# Patient Record
Sex: Male | Born: 1955 | Race: White | Hispanic: No | Smoking: Current some day smoker
Health system: Southern US, Community
[De-identification: ages and names within clinical notes are randomized; demographics above are authoritative.]

## PROBLEM LIST (undated history)

## (undated) DIAGNOSIS — E11628 Type 2 diabetes mellitus with other skin complications: Secondary | ICD-10-CM

## (undated) DIAGNOSIS — E1149 Type 2 diabetes mellitus with other diabetic neurological complication: Secondary | ICD-10-CM

## (undated) DIAGNOSIS — I10 Essential (primary) hypertension: Secondary | ICD-10-CM

## (undated) DIAGNOSIS — L089 Local infection of the skin and subcutaneous tissue, unspecified: Secondary | ICD-10-CM

## (undated) DIAGNOSIS — A4102 Sepsis due to Methicillin resistant Staphylococcus aureus: Secondary | ICD-10-CM

## (undated) DIAGNOSIS — E785 Hyperlipidemia, unspecified: Secondary | ICD-10-CM

## (undated) DIAGNOSIS — N412 Abscess of prostate: Secondary | ICD-10-CM

## (undated) HISTORY — PX: CARDIAC CATHETERIZATION: SHX172

## (undated) HISTORY — DX: Abscess of prostate: N41.2

---

## 1998-06-15 ENCOUNTER — Encounter: Admission: RE | Admit: 1998-06-15 | Discharge: 1998-09-13 | Payer: Self-pay | Admitting: Internal Medicine

## 1998-09-14 ENCOUNTER — Encounter: Admission: RE | Admit: 1998-09-14 | Discharge: 1998-12-08 | Payer: Self-pay | Admitting: Internal Medicine

## 1999-01-04 ENCOUNTER — Encounter: Admission: RE | Admit: 1999-01-04 | Discharge: 1999-04-04 | Payer: Self-pay | Admitting: Internal Medicine

## 1999-04-04 ENCOUNTER — Encounter: Admission: RE | Admit: 1999-04-04 | Discharge: 1999-07-03 | Payer: Self-pay | Admitting: Internal Medicine

## 1999-07-07 ENCOUNTER — Encounter: Admission: RE | Admit: 1999-07-07 | Discharge: 1999-10-05 | Payer: Self-pay | Admitting: Surgery

## 1999-09-13 ENCOUNTER — Encounter (HOSPITAL_BASED_OUTPATIENT_CLINIC_OR_DEPARTMENT_OTHER): Payer: Self-pay | Admitting: Internal Medicine

## 1999-09-20 ENCOUNTER — Ambulatory Visit (HOSPITAL_COMMUNITY): Admission: RE | Admit: 1999-09-20 | Discharge: 1999-09-20 | Payer: Self-pay

## 1999-09-20 ENCOUNTER — Encounter (INDEPENDENT_AMBULATORY_CARE_PROVIDER_SITE_OTHER): Payer: Self-pay | Admitting: *Deleted

## 1999-10-24 ENCOUNTER — Encounter: Admission: RE | Admit: 1999-10-24 | Discharge: 2000-01-22 | Payer: Self-pay | Admitting: Internal Medicine

## 2000-02-26 ENCOUNTER — Encounter: Admission: RE | Admit: 2000-02-26 | Discharge: 2000-05-26 | Payer: Self-pay | Admitting: Internal Medicine

## 2000-06-04 ENCOUNTER — Encounter: Admission: RE | Admit: 2000-06-04 | Discharge: 2000-09-02 | Payer: Self-pay | Admitting: *Deleted

## 2000-07-21 ENCOUNTER — Encounter: Payer: Self-pay | Admitting: *Deleted

## 2000-07-21 ENCOUNTER — Emergency Department (HOSPITAL_COMMUNITY): Admission: EM | Admit: 2000-07-21 | Discharge: 2000-07-21 | Payer: Self-pay

## 2000-07-22 ENCOUNTER — Emergency Department (HOSPITAL_COMMUNITY): Admission: EM | Admit: 2000-07-22 | Discharge: 2000-07-22 | Payer: Self-pay | Admitting: Emergency Medicine

## 2000-10-02 ENCOUNTER — Encounter: Admission: RE | Admit: 2000-10-02 | Discharge: 2000-12-31 | Payer: Self-pay | Admitting: Internal Medicine

## 2001-01-01 ENCOUNTER — Encounter: Admission: RE | Admit: 2001-01-01 | Discharge: 2001-01-06 | Payer: Self-pay | Admitting: Internal Medicine

## 2004-11-26 DIAGNOSIS — L97909 Non-pressure chronic ulcer of unspecified part of unspecified lower leg with unspecified severity: Secondary | ICD-10-CM | POA: Insufficient documentation

## 2004-12-10 ENCOUNTER — Ambulatory Visit: Payer: Self-pay | Admitting: Infectious Diseases

## 2004-12-10 ENCOUNTER — Inpatient Hospital Stay (HOSPITAL_COMMUNITY): Admission: EM | Admit: 2004-12-10 | Discharge: 2004-12-20 | Payer: Self-pay | Admitting: Family Medicine

## 2007-02-13 ENCOUNTER — Ambulatory Visit: Payer: Self-pay | Admitting: Internal Medicine

## 2007-02-13 LAB — CONVERTED CEMR LAB
LDL Cholesterol: 63 mg/dL
Triglycerides: 176 mg/dL

## 2007-02-25 ENCOUNTER — Ambulatory Visit: Payer: Self-pay | Admitting: Internal Medicine

## 2007-03-04 ENCOUNTER — Ambulatory Visit: Payer: Self-pay | Admitting: *Deleted

## 2007-03-25 ENCOUNTER — Ambulatory Visit: Payer: Self-pay | Admitting: Internal Medicine

## 2007-03-26 ENCOUNTER — Ambulatory Visit: Payer: Self-pay | Admitting: Internal Medicine

## 2007-05-01 ENCOUNTER — Ambulatory Visit: Payer: Self-pay | Admitting: Internal Medicine

## 2007-08-20 ENCOUNTER — Ambulatory Visit: Payer: Self-pay | Admitting: Internal Medicine

## 2007-08-20 ENCOUNTER — Encounter (INDEPENDENT_AMBULATORY_CARE_PROVIDER_SITE_OTHER): Payer: Self-pay | Admitting: Internal Medicine

## 2007-08-26 DIAGNOSIS — E785 Hyperlipidemia, unspecified: Secondary | ICD-10-CM

## 2007-08-26 DIAGNOSIS — I10 Essential (primary) hypertension: Secondary | ICD-10-CM

## 2007-08-26 HISTORY — DX: Hyperlipidemia, unspecified: E78.5

## 2007-10-02 ENCOUNTER — Ambulatory Visit: Payer: Self-pay | Admitting: Internal Medicine

## 2007-10-02 DIAGNOSIS — E1140 Type 2 diabetes mellitus with diabetic neuropathy, unspecified: Secondary | ICD-10-CM | POA: Insufficient documentation

## 2007-10-02 DIAGNOSIS — R109 Unspecified abdominal pain: Secondary | ICD-10-CM | POA: Insufficient documentation

## 2007-10-02 DIAGNOSIS — E1149 Type 2 diabetes mellitus with other diabetic neurological complication: Secondary | ICD-10-CM

## 2007-10-02 DIAGNOSIS — E119 Type 2 diabetes mellitus without complications: Secondary | ICD-10-CM | POA: Insufficient documentation

## 2007-10-02 DIAGNOSIS — R350 Frequency of micturition: Secondary | ICD-10-CM | POA: Insufficient documentation

## 2007-10-02 DIAGNOSIS — N4611 Organic oligospermia: Secondary | ICD-10-CM | POA: Insufficient documentation

## 2007-10-02 DIAGNOSIS — R609 Edema, unspecified: Secondary | ICD-10-CM | POA: Insufficient documentation

## 2007-10-02 HISTORY — DX: Type 2 diabetes mellitus with other diabetic neurological complication: E11.49

## 2007-10-02 LAB — CONVERTED CEMR LAB
Blood Glucose, AC Bkfst: 94 mg/dL
Blood Glucose, Fingerstick: 288
Nitrite: NEGATIVE
PSA: 0.75 ng/mL (ref 0.10–4.00)
Specific Gravity, Urine: 1.015
WBC Urine, dipstick: NEGATIVE
pH: 6.5

## 2007-10-12 ENCOUNTER — Encounter (INDEPENDENT_AMBULATORY_CARE_PROVIDER_SITE_OTHER): Payer: Self-pay | Admitting: Internal Medicine

## 2007-11-04 ENCOUNTER — Ambulatory Visit: Payer: Self-pay | Admitting: Internal Medicine

## 2007-11-04 DIAGNOSIS — B9789 Other viral agents as the cause of diseases classified elsewhere: Secondary | ICD-10-CM | POA: Insufficient documentation

## 2007-11-04 LAB — CONVERTED CEMR LAB: Blood Glucose, Fingerstick: 166

## 2007-11-05 ENCOUNTER — Encounter (INDEPENDENT_AMBULATORY_CARE_PROVIDER_SITE_OTHER): Payer: Self-pay | Admitting: Internal Medicine

## 2007-11-09 LAB — CONVERTED CEMR LAB
BUN: 6 mg/dL (ref 6–23)
Chloride: 97 meq/L (ref 96–112)
Creatinine, Ser: 0.8 mg/dL (ref 0.40–1.50)
Potassium: 3 meq/L — ABNORMAL LOW (ref 3.5–5.3)

## 2007-11-25 ENCOUNTER — Encounter (INDEPENDENT_AMBULATORY_CARE_PROVIDER_SITE_OTHER): Payer: Self-pay | Admitting: Nurse Practitioner

## 2007-11-25 ENCOUNTER — Ambulatory Visit: Payer: Self-pay | Admitting: Internal Medicine

## 2007-11-26 LAB — CONVERTED CEMR LAB
Calcium: 8.8 mg/dL (ref 8.4–10.5)
Creatinine, Ser: 0.69 mg/dL (ref 0.40–1.50)
Potassium: 3.1 meq/L — ABNORMAL LOW (ref 3.5–5.3)
Sodium: 139 meq/L (ref 135–145)

## 2007-12-05 ENCOUNTER — Ambulatory Visit: Payer: Self-pay | Admitting: Internal Medicine

## 2008-01-19 ENCOUNTER — Telehealth (INDEPENDENT_AMBULATORY_CARE_PROVIDER_SITE_OTHER): Payer: Self-pay | Admitting: Internal Medicine

## 2008-01-20 ENCOUNTER — Ambulatory Visit (HOSPITAL_COMMUNITY): Admission: RE | Admit: 2008-01-20 | Discharge: 2008-01-20 | Payer: Self-pay | Admitting: Internal Medicine

## 2008-01-20 ENCOUNTER — Ambulatory Visit: Payer: Self-pay | Admitting: Internal Medicine

## 2008-01-20 LAB — CONVERTED CEMR LAB: Blood Glucose, Fingerstick: 438

## 2008-01-23 ENCOUNTER — Ambulatory Visit: Payer: Self-pay | Admitting: Internal Medicine

## 2008-01-23 DIAGNOSIS — J209 Acute bronchitis, unspecified: Secondary | ICD-10-CM | POA: Insufficient documentation

## 2008-01-23 LAB — CONVERTED CEMR LAB: Blood Glucose, Fingerstick: 257

## 2008-04-02 ENCOUNTER — Encounter (INDEPENDENT_AMBULATORY_CARE_PROVIDER_SITE_OTHER): Payer: Self-pay | Admitting: Internal Medicine

## 2008-05-11 ENCOUNTER — Ambulatory Visit: Payer: Self-pay | Admitting: Internal Medicine

## 2008-05-11 DIAGNOSIS — R634 Abnormal weight loss: Secondary | ICD-10-CM | POA: Insufficient documentation

## 2008-05-11 DIAGNOSIS — F329 Major depressive disorder, single episode, unspecified: Secondary | ICD-10-CM

## 2008-05-17 LAB — CONVERTED CEMR LAB
ALT: 26 units/L (ref 0–53)
AST: 40 units/L — ABNORMAL HIGH (ref 0–37)
Alkaline Phosphatase: 67 units/L (ref 39–117)
Basophils Absolute: 0 10*3/uL (ref 0.0–0.1)
CO2: 33 meq/L — ABNORMAL HIGH (ref 19–32)
Creatinine, Ser: 0.87 mg/dL (ref 0.40–1.50)
Eosinophils Absolute: 0.1 10*3/uL (ref 0.0–0.7)
Eosinophils Relative: 1 % (ref 0–5)
Glucose, Bld: 165 mg/dL — ABNORMAL HIGH (ref 70–99)
Lymphocytes Relative: 26 % (ref 12–46)
MCHC: 32.4 g/dL (ref 30.0–36.0)
Potassium: 3.2 meq/L — ABNORMAL LOW (ref 3.5–5.3)
Total Bilirubin: 1 mg/dL (ref 0.3–1.2)
Total Protein: 7.8 g/dL (ref 6.0–8.3)
WBC: 7.2 10*3/uL (ref 4.0–10.5)

## 2008-05-18 ENCOUNTER — Ambulatory Visit: Payer: Self-pay | Admitting: Internal Medicine

## 2008-06-10 LAB — CONVERTED CEMR LAB
Iron: 90 ug/dL (ref 42–165)
TIBC: 308 ug/dL (ref 215–435)
UIBC: 218 ug/dL

## 2008-07-02 ENCOUNTER — Ambulatory Visit: Payer: Self-pay | Admitting: Internal Medicine

## 2008-07-02 DIAGNOSIS — E876 Hypokalemia: Secondary | ICD-10-CM | POA: Insufficient documentation

## 2008-07-02 DIAGNOSIS — D649 Anemia, unspecified: Secondary | ICD-10-CM

## 2008-07-02 LAB — CONVERTED CEMR LAB
Bilirubin Urine: NEGATIVE
Blood Glucose, Fingerstick: 141
Glucose, Urine, Semiquant: NEGATIVE
Ketones, urine, test strip: NEGATIVE
WBC Urine, dipstick: NEGATIVE
pH: 7

## 2008-07-03 LAB — CONVERTED CEMR LAB: Vitamin B-12: 269 pg/mL (ref 211–911)

## 2008-07-06 ENCOUNTER — Encounter (INDEPENDENT_AMBULATORY_CARE_PROVIDER_SITE_OTHER): Payer: Self-pay | Admitting: Internal Medicine

## 2008-07-07 ENCOUNTER — Telehealth (INDEPENDENT_AMBULATORY_CARE_PROVIDER_SITE_OTHER): Payer: Self-pay | Admitting: Internal Medicine

## 2008-07-07 DIAGNOSIS — K029 Dental caries, unspecified: Secondary | ICD-10-CM | POA: Insufficient documentation

## 2008-07-08 ENCOUNTER — Encounter (INDEPENDENT_AMBULATORY_CARE_PROVIDER_SITE_OTHER): Payer: Self-pay | Admitting: Internal Medicine

## 2008-07-09 ENCOUNTER — Ambulatory Visit: Payer: Self-pay | Admitting: Internal Medicine

## 2008-07-17 DIAGNOSIS — D518 Other vitamin B12 deficiency anemias: Secondary | ICD-10-CM

## 2008-07-23 ENCOUNTER — Ambulatory Visit: Payer: Self-pay | Admitting: Internal Medicine

## 2008-07-26 ENCOUNTER — Ambulatory Visit: Payer: Self-pay | Admitting: Family Medicine

## 2008-07-27 ENCOUNTER — Ambulatory Visit: Payer: Self-pay | Admitting: Internal Medicine

## 2008-07-28 ENCOUNTER — Ambulatory Visit: Payer: Self-pay | Admitting: Internal Medicine

## 2008-07-29 ENCOUNTER — Ambulatory Visit: Payer: Self-pay | Admitting: Internal Medicine

## 2008-07-30 ENCOUNTER — Ambulatory Visit: Payer: Self-pay | Admitting: Internal Medicine

## 2008-08-03 ENCOUNTER — Ambulatory Visit: Payer: Self-pay | Admitting: Internal Medicine

## 2008-08-20 ENCOUNTER — Ambulatory Visit: Payer: Self-pay | Admitting: Internal Medicine

## 2008-10-07 ENCOUNTER — Ambulatory Visit: Payer: Self-pay | Admitting: Internal Medicine

## 2008-10-07 LAB — CONVERTED CEMR LAB: Blood Glucose, Fingerstick: 374

## 2008-10-19 ENCOUNTER — Ambulatory Visit: Payer: Self-pay | Admitting: Internal Medicine

## 2008-10-26 ENCOUNTER — Encounter (INDEPENDENT_AMBULATORY_CARE_PROVIDER_SITE_OTHER): Payer: Self-pay | Admitting: Internal Medicine

## 2008-11-12 ENCOUNTER — Encounter (INDEPENDENT_AMBULATORY_CARE_PROVIDER_SITE_OTHER): Payer: Self-pay | Admitting: Internal Medicine

## 2008-11-12 DIAGNOSIS — E11311 Type 2 diabetes mellitus with unspecified diabetic retinopathy with macular edema: Secondary | ICD-10-CM | POA: Insufficient documentation

## 2008-11-24 ENCOUNTER — Telehealth (INDEPENDENT_AMBULATORY_CARE_PROVIDER_SITE_OTHER): Payer: Self-pay | Admitting: Internal Medicine

## 2009-01-07 ENCOUNTER — Telehealth (INDEPENDENT_AMBULATORY_CARE_PROVIDER_SITE_OTHER): Payer: Self-pay | Admitting: *Deleted

## 2009-01-20 ENCOUNTER — Ambulatory Visit: Payer: Self-pay | Admitting: Internal Medicine

## 2009-01-20 DIAGNOSIS — N390 Urinary tract infection, site not specified: Secondary | ICD-10-CM

## 2009-01-20 LAB — CONVERTED CEMR LAB
Bilirubin Urine: NEGATIVE
Hgb A1c MFr Bld: 11.1 %
Nitrite: NEGATIVE
pH: 5

## 2009-01-21 ENCOUNTER — Encounter (INDEPENDENT_AMBULATORY_CARE_PROVIDER_SITE_OTHER): Payer: Self-pay | Admitting: Internal Medicine

## 2009-01-21 LAB — CONVERTED CEMR LAB
ALT: 82 units/L — ABNORMAL HIGH (ref 0–53)
Alkaline Phosphatase: 129 units/L — ABNORMAL HIGH (ref 39–117)
Basophils Absolute: 0 10*3/uL (ref 0.0–0.1)
Calcium: 8.9 mg/dL (ref 8.4–10.5)
Chloride: 99 meq/L (ref 96–112)
Creatinine, Ser: 0.77 mg/dL (ref 0.40–1.50)
Glucose, Bld: 397 mg/dL — ABNORMAL HIGH (ref 70–99)
HCT: 30.7 % — ABNORMAL LOW (ref 39.0–52.0)
Iron: 20 ug/dL — ABNORMAL LOW (ref 42–165)
Lymphocytes Relative: 13 % (ref 12–46)
MCHC: 31.9 g/dL (ref 30.0–36.0)
RDW: 13.3 % (ref 11.5–15.5)
Saturation Ratios: 8 % — ABNORMAL LOW (ref 20–55)
TIBC: 259 ug/dL (ref 215–435)
Vitamin B-12: 1244 pg/mL — ABNORMAL HIGH (ref 211–911)

## 2009-01-24 ENCOUNTER — Telehealth (INDEPENDENT_AMBULATORY_CARE_PROVIDER_SITE_OTHER): Payer: Self-pay | Admitting: Internal Medicine

## 2009-01-28 ENCOUNTER — Ambulatory Visit: Payer: Self-pay | Admitting: Internal Medicine

## 2009-01-28 DIAGNOSIS — E860 Dehydration: Secondary | ICD-10-CM

## 2009-01-28 LAB — CONVERTED CEMR LAB
Nitrite: NEGATIVE
Protein, U semiquant: 100
Urobilinogen, UA: 2

## 2009-01-29 ENCOUNTER — Encounter (INDEPENDENT_AMBULATORY_CARE_PROVIDER_SITE_OTHER): Payer: Self-pay | Admitting: Internal Medicine

## 2009-02-01 ENCOUNTER — Ambulatory Visit: Payer: Self-pay | Admitting: Internal Medicine

## 2009-02-01 LAB — CONVERTED CEMR LAB
Band Neutrophils: 0 % (ref 0–10)
Basophils Absolute: 0 10*3/uL (ref 0.0–0.1)
Basophils Relative: 0 % (ref 0–1)
Blood Glucose, Fingerstick: 274
Creatinine, Ser: 1.04 mg/dL (ref 0.40–1.50)
HCT: 33.6 % — ABNORMAL LOW (ref 39.0–52.0)
Lymphs Abs: 1.5 10*3/uL (ref 0.7–4.0)
MCHC: 32.4 g/dL (ref 30.0–36.0)
MCV: 88.7 fL (ref 78.0–100.0)
Monocytes Absolute: 0.7 10*3/uL (ref 0.1–1.0)
Neutro Abs: 8.7 10*3/uL — ABNORMAL HIGH (ref 1.7–7.7)
Platelets: 355 10*3/uL (ref 150–400)
Potassium: 5.3 meq/L (ref 3.5–5.3)
RDW: 14.1 % (ref 11.5–15.5)
Sodium: 134 meq/L — ABNORMAL LOW (ref 135–145)
Total Bilirubin: 0.7 mg/dL (ref 0.3–1.2)
Total Protein: 7.2 g/dL (ref 6.0–8.3)

## 2009-02-03 ENCOUNTER — Ambulatory Visit: Payer: Self-pay | Admitting: Internal Medicine

## 2009-02-03 LAB — CONVERTED CEMR LAB
Bilirubin Urine: NEGATIVE
Blood in Urine, dipstick: NEGATIVE
Glucose, Urine, Semiquant: NEGATIVE
Ketones, urine, test strip: NEGATIVE
pH: 8

## 2009-02-04 ENCOUNTER — Encounter (INDEPENDENT_AMBULATORY_CARE_PROVIDER_SITE_OTHER): Payer: Self-pay | Admitting: Internal Medicine

## 2009-02-07 ENCOUNTER — Inpatient Hospital Stay (HOSPITAL_COMMUNITY): Admission: EM | Admit: 2009-02-07 | Discharge: 2009-02-15 | Payer: Self-pay | Admitting: Emergency Medicine

## 2009-02-14 ENCOUNTER — Encounter (INDEPENDENT_AMBULATORY_CARE_PROVIDER_SITE_OTHER): Payer: Self-pay | Admitting: Internal Medicine

## 2009-02-16 ENCOUNTER — Encounter (INDEPENDENT_AMBULATORY_CARE_PROVIDER_SITE_OTHER): Payer: Self-pay | Admitting: Internal Medicine

## 2009-02-17 ENCOUNTER — Ambulatory Visit: Payer: Self-pay | Admitting: Internal Medicine

## 2009-02-17 DIAGNOSIS — N412 Abscess of prostate: Secondary | ICD-10-CM | POA: Insufficient documentation

## 2009-02-17 HISTORY — DX: Abscess of prostate: N41.2

## 2009-03-01 ENCOUNTER — Ambulatory Visit: Payer: Self-pay | Admitting: Internal Medicine

## 2009-03-01 LAB — CONVERTED CEMR LAB
Chloride: 101 meq/L (ref 96–112)
Creatinine, Ser: 1.7 mg/dL — ABNORMAL HIGH (ref 0.40–1.50)
Glucose, Bld: 234 mg/dL — ABNORMAL HIGH (ref 70–99)
Sodium: 136 meq/L (ref 135–145)

## 2009-03-15 ENCOUNTER — Ambulatory Visit: Payer: Self-pay | Admitting: Internal Medicine

## 2009-03-15 ENCOUNTER — Telehealth (INDEPENDENT_AMBULATORY_CARE_PROVIDER_SITE_OTHER): Payer: Self-pay | Admitting: Internal Medicine

## 2009-03-16 LAB — CONVERTED CEMR LAB
BUN: 33 mg/dL — ABNORMAL HIGH (ref 6–23)
CO2: 22 meq/L (ref 19–32)
Calcium: 9.9 mg/dL (ref 8.4–10.5)
Chloride: 98 meq/L (ref 96–112)
Creatinine, Ser: 1.83 mg/dL — ABNORMAL HIGH (ref 0.40–1.50)
Potassium: 6.1 meq/L — ABNORMAL HIGH (ref 3.5–5.3)
Sodium: 132 meq/L — ABNORMAL LOW (ref 135–145)

## 2009-03-17 ENCOUNTER — Encounter: Admission: RE | Admit: 2009-03-17 | Discharge: 2009-03-17 | Payer: Self-pay | Admitting: Internal Medicine

## 2009-03-17 ENCOUNTER — Encounter (INDEPENDENT_AMBULATORY_CARE_PROVIDER_SITE_OTHER): Payer: Self-pay | Admitting: Internal Medicine

## 2009-03-22 ENCOUNTER — Ambulatory Visit: Payer: Self-pay | Admitting: Internal Medicine

## 2009-03-22 DIAGNOSIS — E875 Hyperkalemia: Secondary | ICD-10-CM | POA: Insufficient documentation

## 2009-03-23 ENCOUNTER — Encounter (INDEPENDENT_AMBULATORY_CARE_PROVIDER_SITE_OTHER): Payer: Self-pay | Admitting: Internal Medicine

## 2009-03-24 ENCOUNTER — Ambulatory Visit (HOSPITAL_COMMUNITY): Admission: RE | Admit: 2009-03-24 | Discharge: 2009-03-24 | Payer: Self-pay | Admitting: Internal Medicine

## 2009-03-25 ENCOUNTER — Encounter (INDEPENDENT_AMBULATORY_CARE_PROVIDER_SITE_OTHER): Payer: Self-pay | Admitting: Internal Medicine

## 2009-04-08 ENCOUNTER — Ambulatory Visit: Payer: Self-pay | Admitting: Internal Medicine

## 2009-04-08 LAB — CONVERTED CEMR LAB
Blood Glucose, Fingerstick: 172
Blood in Urine, dipstick: NEGATIVE
Nitrite: NEGATIVE
Protein, U semiquant: 30
Urobilinogen, UA: 1
WBC Urine, dipstick: NEGATIVE

## 2009-04-14 ENCOUNTER — Encounter (INDEPENDENT_AMBULATORY_CARE_PROVIDER_SITE_OTHER): Payer: Self-pay | Admitting: Internal Medicine

## 2009-04-15 ENCOUNTER — Ambulatory Visit: Payer: Self-pay | Admitting: Internal Medicine

## 2009-04-17 ENCOUNTER — Encounter (INDEPENDENT_AMBULATORY_CARE_PROVIDER_SITE_OTHER): Payer: Self-pay | Admitting: Internal Medicine

## 2009-04-22 LAB — CONVERTED CEMR LAB
ALT: 14 units/L (ref 0–53)
Albumin: 4 g/dL (ref 3.5–5.2)
Alkaline Phosphatase: 63 units/L (ref 39–117)
Basophils Absolute: 0 10*3/uL (ref 0.0–0.1)
Calcium: 9.2 mg/dL (ref 8.4–10.5)
Chloride: 101 meq/L (ref 96–112)
Cholesterol: 148 mg/dL (ref 0–200)
Creatinine, Ser: 1.49 mg/dL (ref 0.40–1.50)
Eosinophils Absolute: 0.2 10*3/uL (ref 0.0–0.7)
Eosinophils Relative: 4 % (ref 0–5)
MCHC: 32.3 g/dL (ref 30.0–36.0)
MCV: 86.7 fL (ref 78.0–100.0)
PSA: 0.73 ng/mL (ref 0.10–4.00)
RDW: 16.9 % — ABNORMAL HIGH (ref 11.5–15.5)
Sodium: 139 meq/L (ref 135–145)
Total Bilirubin: 0.5 mg/dL (ref 0.3–1.2)
Total Protein: 7.8 g/dL (ref 6.0–8.3)
VLDL: 33 mg/dL (ref 0–40)
WBC: 5.3 10*3/uL (ref 4.0–10.5)

## 2009-04-26 ENCOUNTER — Telehealth (INDEPENDENT_AMBULATORY_CARE_PROVIDER_SITE_OTHER): Payer: Self-pay | Admitting: Internal Medicine

## 2009-05-17 ENCOUNTER — Ambulatory Visit: Payer: Self-pay | Admitting: Internal Medicine

## 2009-05-17 DIAGNOSIS — I959 Hypotension, unspecified: Secondary | ICD-10-CM | POA: Insufficient documentation

## 2009-05-19 DIAGNOSIS — N179 Acute kidney failure, unspecified: Secondary | ICD-10-CM

## 2009-05-19 LAB — CONVERTED CEMR LAB
AST: 35 units/L (ref 0–37)
BUN: 60 mg/dL — ABNORMAL HIGH (ref 6–23)
Chloride: 103 meq/L (ref 96–112)
Creatinine, Ser: 3.38 mg/dL — ABNORMAL HIGH (ref 0.40–1.50)
HCT: 26.2 % — ABNORMAL LOW (ref 39.0–52.0)
Hemoglobin: 8.4 g/dL — ABNORMAL LOW (ref 13.0–17.0)
Lymphocytes Relative: 26 % (ref 12–46)
Lymphs Abs: 1.5 10*3/uL (ref 0.7–4.0)
MCHC: 32.1 g/dL (ref 30.0–36.0)
Neutro Abs: 3.8 10*3/uL (ref 1.7–7.7)
Platelets: 122 10*3/uL — ABNORMAL LOW (ref 150–400)
Total Bilirubin: 0.5 mg/dL (ref 0.3–1.2)
WBC: 5.8 10*3/uL (ref 4.0–10.5)

## 2009-05-20 ENCOUNTER — Encounter: Payer: Self-pay | Admitting: Cardiovascular Disease

## 2009-05-20 ENCOUNTER — Ambulatory Visit: Payer: Self-pay | Admitting: Internal Medicine

## 2009-05-20 LAB — CONVERTED CEMR LAB
Bilirubin Urine: NEGATIVE
Calcium: 9 mg/dL (ref 8.4–10.5)
Glucose, Bld: 229 mg/dL — ABNORMAL HIGH (ref 70–99)
Glucose, Urine, Semiquant: >=1000
Potassium: 5.3 meq/L (ref 3.5–5.3)
Pro B Natriuretic peptide (BNP): 54.2 pg/mL (ref 0.0–100.0)
Relative Index: 3.4 — ABNORMAL HIGH (ref 0.0–2.5)
Sodium: 136 meq/L (ref 135–145)
Total CK: 806 units/L — ABNORMAL HIGH (ref 7–232)
Troponin I: 0.03 ng/mL (ref <0.06)
WBC Urine, dipstick: NEGATIVE

## 2009-05-23 ENCOUNTER — Ambulatory Visit (HOSPITAL_COMMUNITY): Admission: RE | Admit: 2009-05-23 | Discharge: 2009-05-23 | Payer: Self-pay | Admitting: Internal Medicine

## 2009-05-24 ENCOUNTER — Ambulatory Visit: Payer: Self-pay | Admitting: Internal Medicine

## 2009-05-24 ENCOUNTER — Encounter: Payer: Self-pay | Admitting: Cardiovascular Disease

## 2009-05-25 LAB — CONVERTED CEMR LAB
Calcium: 8.3 mg/dL — ABNORMAL LOW (ref 8.4–10.5)
Potassium: 4.7 meq/L (ref 3.5–5.3)
Sodium: 139 meq/L (ref 135–145)
Total CK: 459 units/L — ABNORMAL HIGH (ref 7–232)
Troponin I: 0.02 ng/mL (ref <0.06)

## 2009-06-23 ENCOUNTER — Ambulatory Visit: Payer: Self-pay | Admitting: Internal Medicine

## 2009-06-27 LAB — CONVERTED CEMR LAB
CO2: 20 meq/L (ref 19–32)
Chloride: 100 meq/L (ref 96–112)
Creatinine, Ser: 1.16 mg/dL (ref 0.40–1.50)
Potassium: 5.9 meq/L — ABNORMAL HIGH (ref 3.5–5.3)
Sodium: 134 meq/L — ABNORMAL LOW (ref 135–145)

## 2009-07-01 ENCOUNTER — Ambulatory Visit: Payer: Self-pay | Admitting: Internal Medicine

## 2009-07-04 ENCOUNTER — Telehealth (INDEPENDENT_AMBULATORY_CARE_PROVIDER_SITE_OTHER): Payer: Self-pay | Admitting: Internal Medicine

## 2009-07-19 ENCOUNTER — Ambulatory Visit: Payer: Self-pay | Admitting: Cardiovascular Disease

## 2009-07-19 DIAGNOSIS — I739 Peripheral vascular disease, unspecified: Secondary | ICD-10-CM

## 2009-07-19 DIAGNOSIS — R0602 Shortness of breath: Secondary | ICD-10-CM | POA: Insufficient documentation

## 2009-07-21 ENCOUNTER — Ambulatory Visit: Payer: Self-pay | Admitting: Internal Medicine

## 2009-07-21 LAB — CONVERTED CEMR LAB
ALT: 17 units/L (ref 0–53)
Albumin: 4.3 g/dL (ref 3.5–5.2)
Cholesterol: 138 mg/dL (ref 0–200)
Collection Interval-CRCL: 24 hr
Creatinine, Urine: 46.2 mg/dL
HDL: 32 mg/dL — ABNORMAL LOW (ref >39)
Indirect Bilirubin: 0.6 mg/dL (ref 0.0–0.9)
LDL Cholesterol: 72 mg/dL (ref 0–99)
Protein, Ur: 108 mg/24hr — ABNORMAL HIGH (ref 50–100)
Total Protein: 7.6 g/dL (ref 6.0–8.3)
Triglycerides: 169 mg/dL — ABNORMAL HIGH (ref <150)
VLDL: 34 mg/dL (ref 0–40)

## 2009-07-27 ENCOUNTER — Telehealth (INDEPENDENT_AMBULATORY_CARE_PROVIDER_SITE_OTHER): Payer: Self-pay

## 2009-07-28 ENCOUNTER — Encounter: Payer: Self-pay | Admitting: Cardiovascular Disease

## 2009-07-28 ENCOUNTER — Encounter: Payer: Self-pay | Admitting: Cardiology

## 2009-07-28 ENCOUNTER — Ambulatory Visit: Payer: Self-pay

## 2009-08-10 ENCOUNTER — Encounter (INDEPENDENT_AMBULATORY_CARE_PROVIDER_SITE_OTHER): Payer: Self-pay | Admitting: Internal Medicine

## 2009-08-26 HISTORY — PX: TOE AMPUTATION: SHX809

## 2009-09-09 ENCOUNTER — Telehealth (INDEPENDENT_AMBULATORY_CARE_PROVIDER_SITE_OTHER): Payer: Self-pay | Admitting: Internal Medicine

## 2009-09-15 ENCOUNTER — Inpatient Hospital Stay (HOSPITAL_COMMUNITY): Admission: EM | Admit: 2009-09-15 | Discharge: 2009-09-21 | Payer: Self-pay | Admitting: Emergency Medicine

## 2009-09-15 ENCOUNTER — Encounter (INDEPENDENT_AMBULATORY_CARE_PROVIDER_SITE_OTHER): Payer: Self-pay | Admitting: Internal Medicine

## 2009-09-19 ENCOUNTER — Ambulatory Visit: Payer: Self-pay | Admitting: Infectious Disease

## 2009-09-20 ENCOUNTER — Encounter: Payer: Self-pay | Admitting: Infectious Disease

## 2009-09-21 ENCOUNTER — Encounter (INDEPENDENT_AMBULATORY_CARE_PROVIDER_SITE_OTHER): Payer: Self-pay | Admitting: Internal Medicine

## 2009-09-21 ENCOUNTER — Ambulatory Visit: Payer: Self-pay | Admitting: Infectious Disease

## 2009-09-21 ENCOUNTER — Telehealth: Payer: Self-pay | Admitting: Infectious Disease

## 2009-09-26 ENCOUNTER — Encounter: Payer: Self-pay | Admitting: Infectious Disease

## 2009-10-04 ENCOUNTER — Encounter: Payer: Self-pay | Admitting: Infectious Disease

## 2009-10-06 ENCOUNTER — Ambulatory Visit: Payer: Self-pay | Admitting: Internal Medicine

## 2009-10-06 LAB — CONVERTED CEMR LAB: Blood Glucose, Fingerstick: 390

## 2009-10-10 ENCOUNTER — Encounter: Payer: Self-pay | Admitting: Infectious Disease

## 2009-10-17 ENCOUNTER — Encounter: Payer: Self-pay | Admitting: Infectious Disease

## 2009-10-18 ENCOUNTER — Encounter: Payer: Self-pay | Admitting: Infectious Disease

## 2009-10-19 ENCOUNTER — Encounter: Payer: Self-pay | Admitting: Infectious Disease

## 2009-10-21 ENCOUNTER — Encounter: Payer: Self-pay | Admitting: Infectious Disease

## 2009-10-22 ENCOUNTER — Ambulatory Visit: Payer: Self-pay | Admitting: Infectious Disease

## 2009-10-24 ENCOUNTER — Encounter: Payer: Self-pay | Admitting: Infectious Disease

## 2009-10-25 ENCOUNTER — Ambulatory Visit: Payer: Self-pay | Admitting: Internal Medicine

## 2009-10-25 LAB — CONVERTED CEMR LAB: Hgb A1c MFr Bld: 10.4 %

## 2009-10-27 ENCOUNTER — Ambulatory Visit: Payer: Self-pay | Admitting: Infectious Disease

## 2009-10-27 ENCOUNTER — Telehealth: Payer: Self-pay | Admitting: Infectious Disease

## 2009-10-27 DIAGNOSIS — F458 Other somatoform disorders: Secondary | ICD-10-CM

## 2009-10-27 DIAGNOSIS — A4102 Sepsis due to Methicillin resistant Staphylococcus aureus: Secondary | ICD-10-CM

## 2009-10-27 DIAGNOSIS — A4901 Methicillin susceptible Staphylococcus aureus infection, unspecified site: Secondary | ICD-10-CM | POA: Insufficient documentation

## 2009-10-27 HISTORY — DX: Sepsis due to methicillin resistant Staphylococcus aureus: A41.02

## 2009-10-27 LAB — CONVERTED CEMR LAB
ALT: 18 units/L (ref 0–53)
Albumin: 3.9 g/dL (ref 3.5–5.2)
Alkaline Phosphatase: 52 units/L (ref 39–117)
Basophils Absolute: 0 10*3/uL (ref 0.0–0.1)
Basophils Relative: 1 % (ref 0–1)
CO2: 27 meq/L (ref 19–32)
Eosinophils Absolute: 0.1 10*3/uL (ref 0.0–0.7)
MCHC: 34.7 g/dL (ref 30.0–36.0)
MCV: 91.8 fL (ref >=78.0)
Neutro Abs: 3.3 10*3/uL (ref 1.7–7.7)
Neutrophils Relative %: 64 % (ref 43–77)
Platelets: 126 10*3/uL — ABNORMAL LOW (ref 150–400)
Potassium: 4.8 meq/L (ref 3.5–5.3)
Sed Rate: 22 mm/hr — ABNORMAL HIGH (ref 0–16)
Sodium: 138 meq/L (ref 135–145)
Total Bilirubin: 0.8 mg/dL (ref 0.3–1.2)
Total Protein: 7.3 g/dL (ref 6.0–8.3)

## 2009-10-28 ENCOUNTER — Encounter: Payer: Self-pay | Admitting: Infectious Disease

## 2009-11-08 ENCOUNTER — Encounter (INDEPENDENT_AMBULATORY_CARE_PROVIDER_SITE_OTHER): Payer: Self-pay | Admitting: Internal Medicine

## 2009-11-22 ENCOUNTER — Inpatient Hospital Stay (HOSPITAL_COMMUNITY): Admission: AD | Admit: 2009-11-22 | Discharge: 2009-11-24 | Payer: Self-pay | Admitting: Internal Medicine

## 2009-11-22 ENCOUNTER — Ambulatory Visit: Payer: Self-pay | Admitting: Infectious Disease

## 2009-11-22 DIAGNOSIS — N508 Other specified disorders of male genital organs: Secondary | ICD-10-CM | POA: Insufficient documentation

## 2009-11-22 DIAGNOSIS — R079 Chest pain, unspecified: Secondary | ICD-10-CM

## 2009-11-22 DIAGNOSIS — R9431 Abnormal electrocardiogram [ECG] [EKG]: Secondary | ICD-10-CM

## 2009-11-22 DIAGNOSIS — I951 Orthostatic hypotension: Secondary | ICD-10-CM | POA: Insufficient documentation

## 2009-11-22 DIAGNOSIS — R55 Syncope and collapse: Secondary | ICD-10-CM

## 2009-11-22 DIAGNOSIS — E1101 Type 2 diabetes mellitus with hyperosmolarity with coma: Secondary | ICD-10-CM

## 2009-11-28 ENCOUNTER — Encounter: Payer: Self-pay | Admitting: Infectious Disease

## 2009-12-09 ENCOUNTER — Encounter (INDEPENDENT_AMBULATORY_CARE_PROVIDER_SITE_OTHER): Payer: Self-pay | Admitting: Internal Medicine

## 2009-12-19 ENCOUNTER — Telehealth (INDEPENDENT_AMBULATORY_CARE_PROVIDER_SITE_OTHER): Payer: Self-pay | Admitting: Internal Medicine

## 2009-12-29 ENCOUNTER — Encounter (INDEPENDENT_AMBULATORY_CARE_PROVIDER_SITE_OTHER): Payer: Self-pay | Admitting: *Deleted

## 2010-01-09 ENCOUNTER — Telehealth (INDEPENDENT_AMBULATORY_CARE_PROVIDER_SITE_OTHER): Payer: Self-pay | Admitting: Internal Medicine

## 2010-01-10 ENCOUNTER — Ambulatory Visit: Payer: Self-pay | Admitting: Internal Medicine

## 2010-01-10 ENCOUNTER — Encounter: Payer: Self-pay | Admitting: Physician Assistant

## 2010-01-10 LAB — CONVERTED CEMR LAB
Albumin: 4.3 g/dL (ref 3.5–5.2)
BUN: 17 mg/dL (ref 6–23)
Basophils Relative: 1 % (ref 0–1)
CO2: 23 meq/L (ref 19–32)
Eosinophils Relative: 3 % (ref 0–5)
Glucose, Bld: 224 mg/dL — ABNORMAL HIGH (ref 70–99)
HCT: 35.1 % — ABNORMAL LOW (ref 39.0–52.0)
Hemoglobin: 11.5 g/dL — ABNORMAL LOW (ref 13.0–17.0)
MCHC: 32.8 g/dL (ref 30.0–36.0)
MCV: 92.4 fL (ref 78.0–100.0)
Monocytes Absolute: 0.4 10*3/uL (ref 0.1–1.0)
Monocytes Relative: 10 % (ref 3–12)
Neutro Abs: 2.7 10*3/uL (ref 1.7–7.7)
RBC: 3.8 M/uL — ABNORMAL LOW (ref 4.22–5.81)
Sodium: 137 meq/L (ref 135–145)
TSH: 2.257 microintl units/mL (ref 0.350–4.500)
Total Bilirubin: 0.7 mg/dL (ref 0.3–1.2)
Total Protein: 7.1 g/dL (ref 6.0–8.3)

## 2010-01-12 ENCOUNTER — Telehealth (INDEPENDENT_AMBULATORY_CARE_PROVIDER_SITE_OTHER): Payer: Self-pay | Admitting: Internal Medicine

## 2010-01-16 IMAGING — CT CT PELVIS W/O CM
2 of 3 series · 17 of 46 positions shown, 19 images · non-contrast
Comparison: None

CLINICAL DATA: Question of prostatitis.  UTI.  Diabetes.
Hypertension.  Dysuria.

CT PELVIS WITHOUT CONTRAST
TECHNIQUE: Multidetector CT imaging of the pelvis was performed
following the standard protocol without intravenous contrast.

[Series 2: pelvis st · axial · 0.78mm/px · z∈[-309,-89]mm · 14 of 52 slices shown, 16 images]
[im 4/52  soft-tissue]
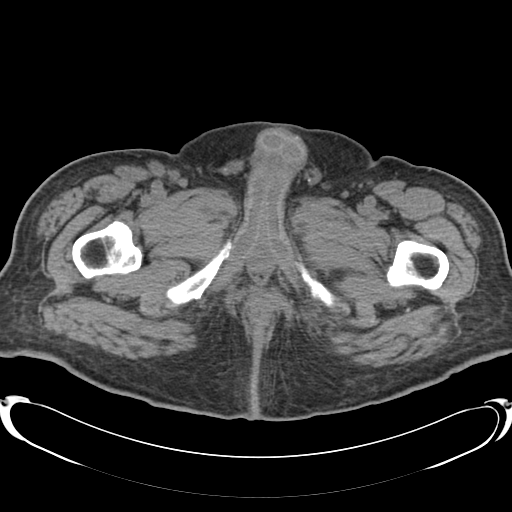
[im 4/52  bone]
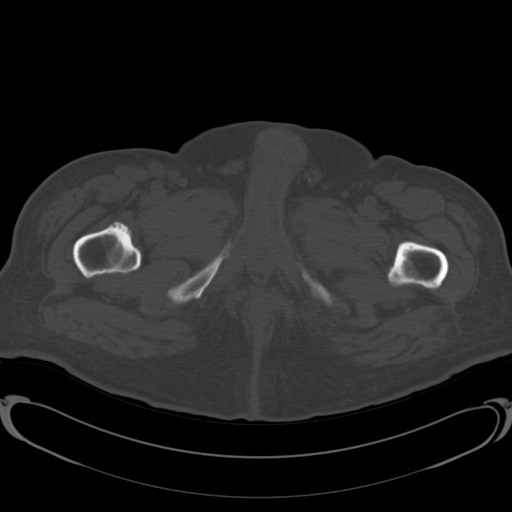
[im 7/52  soft-tissue]
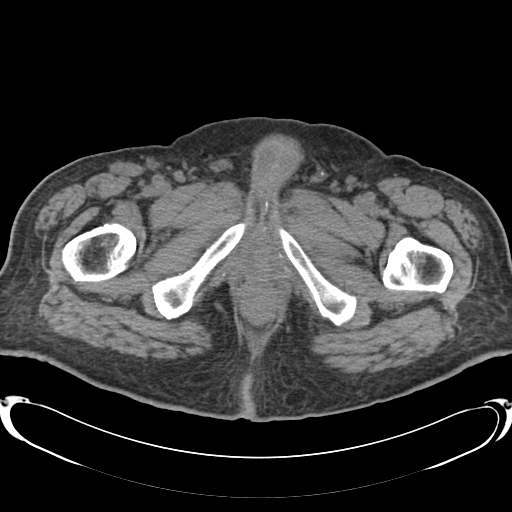
[im 10/52  soft-tissue]
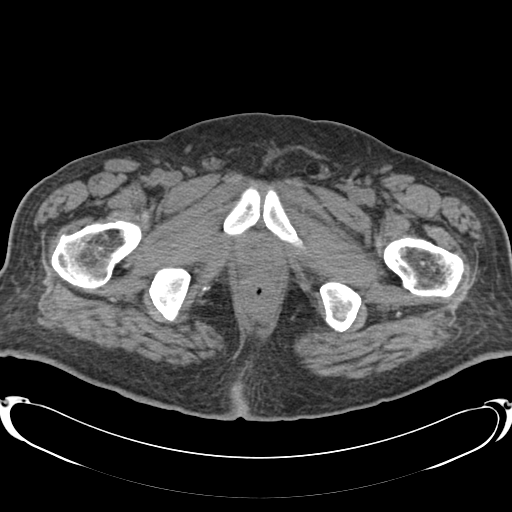
[im 14/52  soft-tissue]
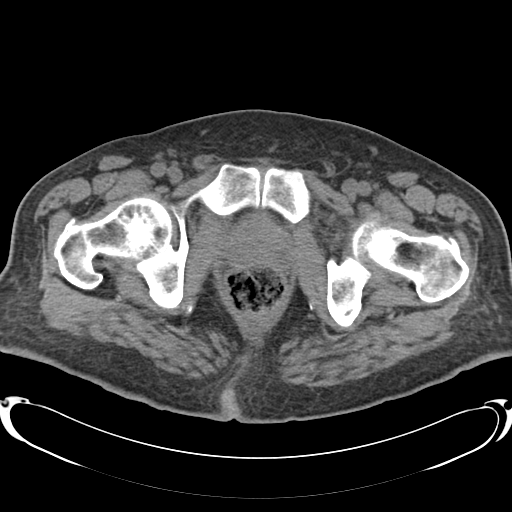
[im 17/52  soft-tissue]
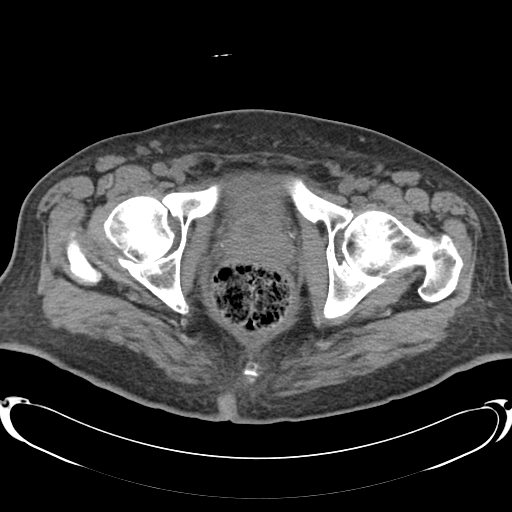
[im 20/52  soft-tissue]
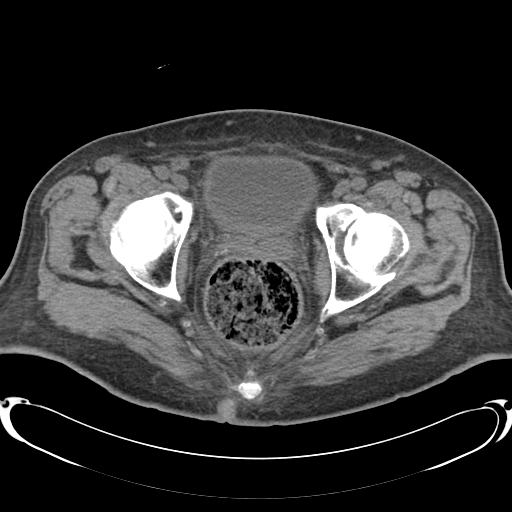
[im 24/52  soft-tissue]
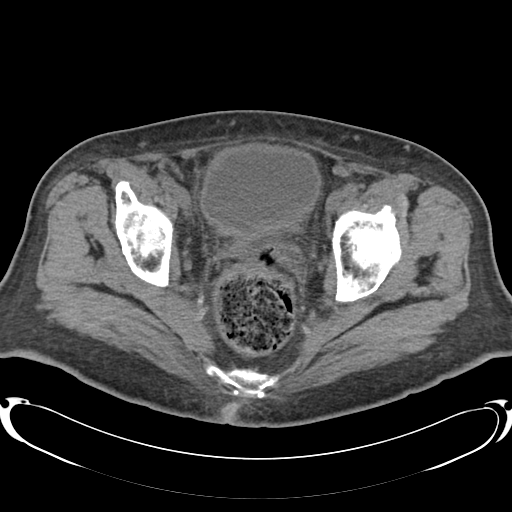
[im 28/52  soft-tissue]
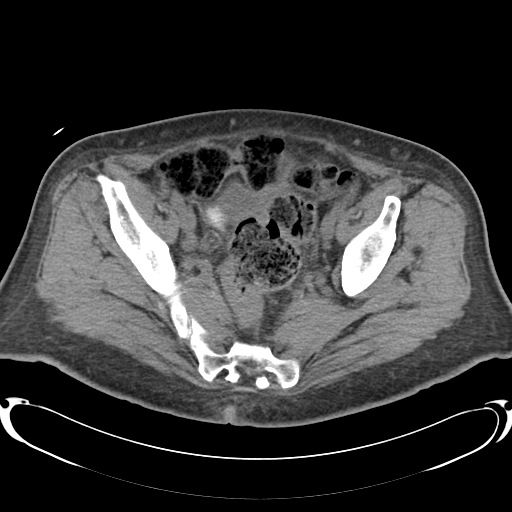
[im 32/52  soft-tissue]
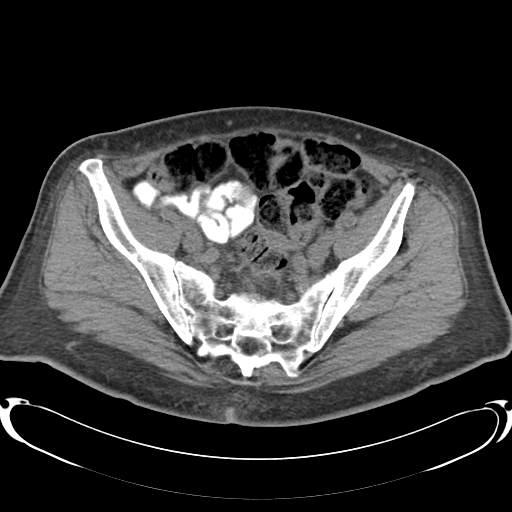
[im 32/52  bone]
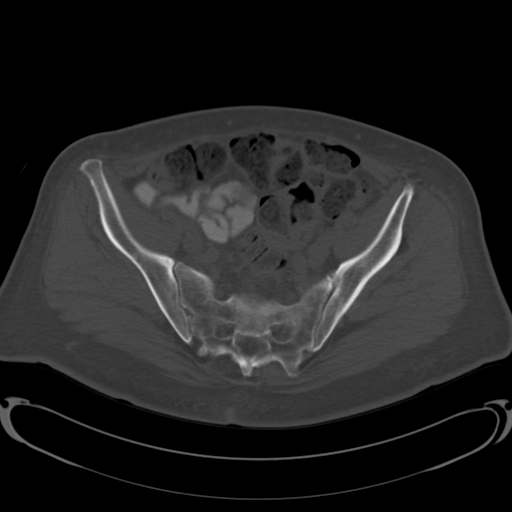
[im 35/52  soft-tissue]
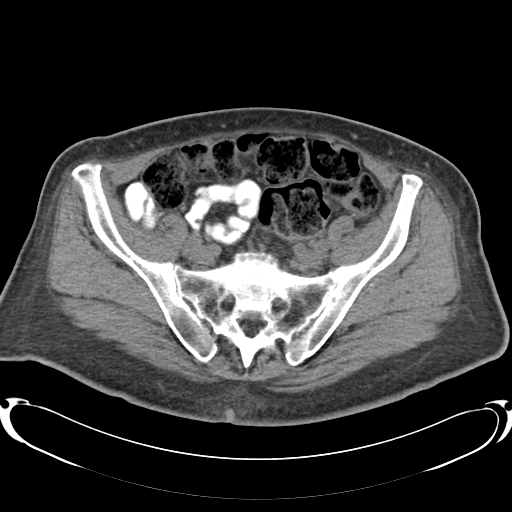
[im 38/52  soft-tissue]
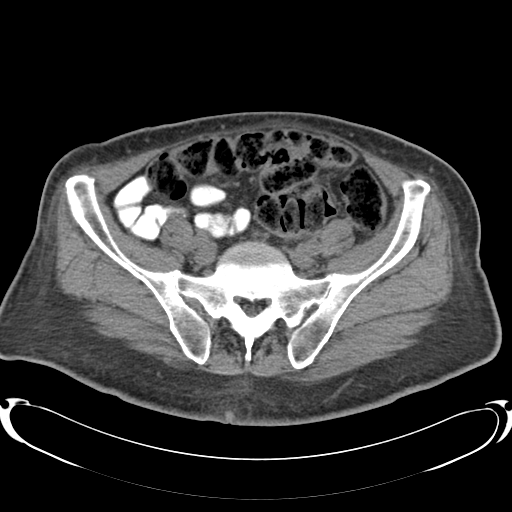
[im 42/52  soft-tissue]
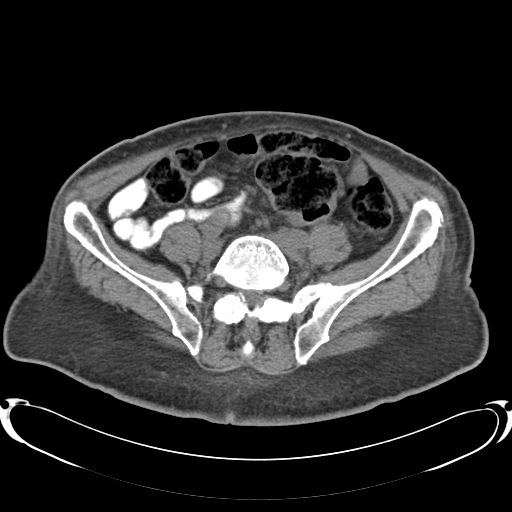
[im 45/52  soft-tissue]
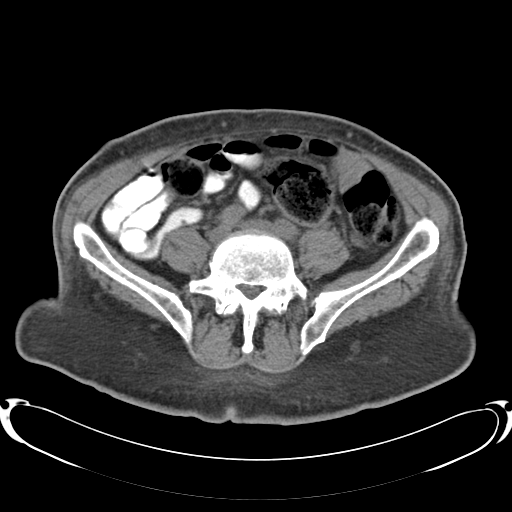
[im 48/52  soft-tissue]
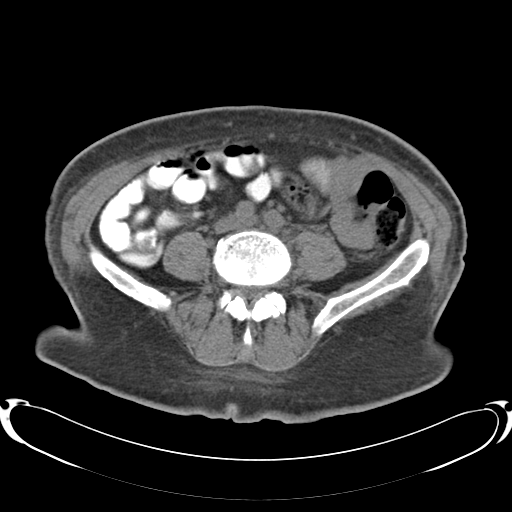

[Series 602: <mpr thick range> · coronal · 0.78mm/px · 3 of 75 slices shown]
[im 25/75  soft-tissue]
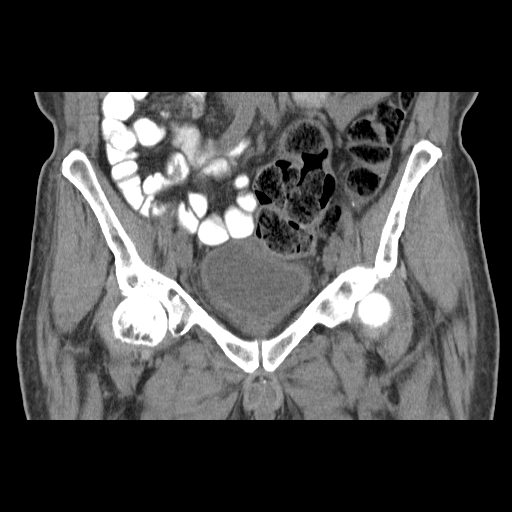
[im 33/75  soft-tissue]
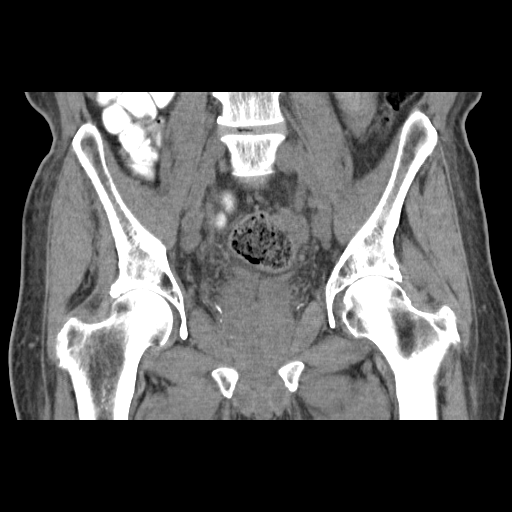
[im 42/75  soft-tissue]
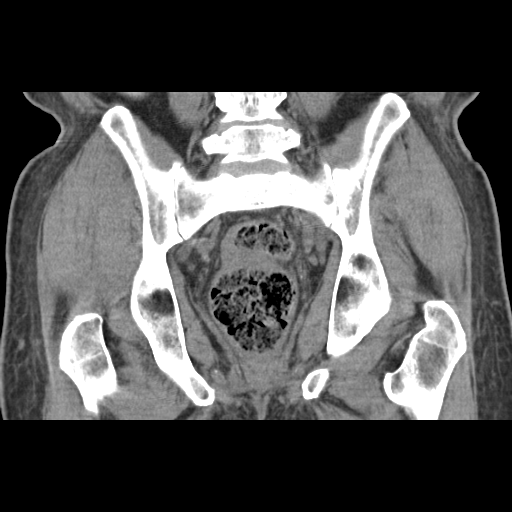

[17 of 46 positions shown; findings below may reference images not displayed]

FINDINGS: Prostate gland is enlarged measuring 5.8 cm transversely.
There is a 1.9 cm oval low attenuation focus in the anterior aspect
of the prostate gland just to the right of midline.  This is
suspicious for a prosthetics abscess.  Seminal vesicles
unremarkable.  Bladder wall thickening/hypertrophy.  Incidentally,
there is a large amounts stool in the colon and probable rectal
fecal impaction.  Abdominal wall edema compatible with anasarca.
Bony pelvis intact.
IMPRESSION: Prostatic enlargement.  Suspicion for prostate abscess.  See above
comments.

## 2010-01-18 ENCOUNTER — Ambulatory Visit: Payer: Self-pay | Admitting: Physician Assistant

## 2010-01-18 ENCOUNTER — Telehealth (INDEPENDENT_AMBULATORY_CARE_PROVIDER_SITE_OTHER): Payer: Self-pay | Admitting: Internal Medicine

## 2010-01-18 LAB — CONVERTED CEMR LAB: Blood Glucose, Fingerstick: 263

## 2010-01-20 ENCOUNTER — Ambulatory Visit: Payer: Self-pay | Admitting: Internal Medicine

## 2010-02-03 ENCOUNTER — Ambulatory Visit: Payer: Self-pay | Admitting: Internal Medicine

## 2010-02-03 LAB — CONVERTED CEMR LAB: Blood Glucose, Fingerstick: 321

## 2010-02-21 ENCOUNTER — Ambulatory Visit: Payer: Self-pay | Admitting: Internal Medicine

## 2010-03-10 ENCOUNTER — Encounter (INDEPENDENT_AMBULATORY_CARE_PROVIDER_SITE_OTHER): Payer: Self-pay | Admitting: Internal Medicine

## 2010-05-25 ENCOUNTER — Ambulatory Visit: Payer: Self-pay | Admitting: Internal Medicine

## 2010-05-25 LAB — CONVERTED CEMR LAB: Blood Glucose, Fingerstick: 210

## 2010-06-02 ENCOUNTER — Ambulatory Visit: Payer: Self-pay | Admitting: Internal Medicine

## 2010-06-02 ENCOUNTER — Telehealth (INDEPENDENT_AMBULATORY_CARE_PROVIDER_SITE_OTHER): Payer: Self-pay | Admitting: Internal Medicine

## 2010-06-02 DIAGNOSIS — G5620 Lesion of ulnar nerve, unspecified upper limb: Secondary | ICD-10-CM

## 2010-06-02 LAB — CONVERTED CEMR LAB
Basophils Relative: 0 % (ref 0–1)
CO2: 22 meq/L (ref 19–32)
Cholesterol: 134 mg/dL (ref 0–200)
Creatinine, Ser: 1.31 mg/dL (ref 0.40–1.50)
Eosinophils Absolute: 0.1 10*3/uL (ref 0.0–0.7)
Glucose, Bld: 193 mg/dL — ABNORMAL HIGH (ref 70–99)
Hemoglobin: 11.5 g/dL — ABNORMAL LOW (ref 13.0–17.0)
MCHC: 32.4 g/dL (ref 30.0–36.0)
MCV: 92.2 fL (ref 78.0–100.0)
Monocytes Absolute: 0.5 10*3/uL (ref 0.1–1.0)
Monocytes Relative: 6 % (ref 3–12)
RBC: 3.85 M/uL — ABNORMAL LOW (ref 4.22–5.81)
Total Bilirubin: 1.1 mg/dL (ref 0.3–1.2)
Total CHOL/HDL Ratio: 4.2
Triglycerides: 165 mg/dL — ABNORMAL HIGH (ref <150)
VLDL: 33 mg/dL (ref 0–40)

## 2010-06-19 ENCOUNTER — Encounter (INDEPENDENT_AMBULATORY_CARE_PROVIDER_SITE_OTHER): Payer: Self-pay | Admitting: Internal Medicine

## 2010-06-22 ENCOUNTER — Encounter (INDEPENDENT_AMBULATORY_CARE_PROVIDER_SITE_OTHER): Payer: Self-pay | Admitting: Internal Medicine

## 2010-07-04 ENCOUNTER — Ambulatory Visit: Payer: Self-pay | Admitting: Internal Medicine

## 2010-07-04 ENCOUNTER — Encounter: Payer: Self-pay | Admitting: Physician Assistant

## 2010-07-04 ENCOUNTER — Telehealth (INDEPENDENT_AMBULATORY_CARE_PROVIDER_SITE_OTHER): Payer: Self-pay | Admitting: Internal Medicine

## 2010-07-06 LAB — CONVERTED CEMR LAB
CO2: 22 meq/L (ref 19–32)
Calcium: 8.9 mg/dL (ref 8.4–10.5)
Glucose, Bld: 189 mg/dL — ABNORMAL HIGH (ref 70–99)
Sodium: 136 meq/L (ref 135–145)

## 2010-07-17 ENCOUNTER — Telehealth (INDEPENDENT_AMBULATORY_CARE_PROVIDER_SITE_OTHER): Payer: Self-pay | Admitting: Internal Medicine

## 2010-07-21 ENCOUNTER — Encounter (INDEPENDENT_AMBULATORY_CARE_PROVIDER_SITE_OTHER): Payer: Self-pay | Admitting: *Deleted

## 2010-08-23 ENCOUNTER — Encounter (INDEPENDENT_AMBULATORY_CARE_PROVIDER_SITE_OTHER): Payer: Self-pay | Admitting: Internal Medicine

## 2010-08-29 IMAGING — CR DG CHEST 1V PORT
1 series · 1 of 1 positions shown · non-contrast
Comparison: 01/20/2008.

CLINICAL DATA: PICC line placement.

PORTABLE CHEST - 1 VIEW

[AP]
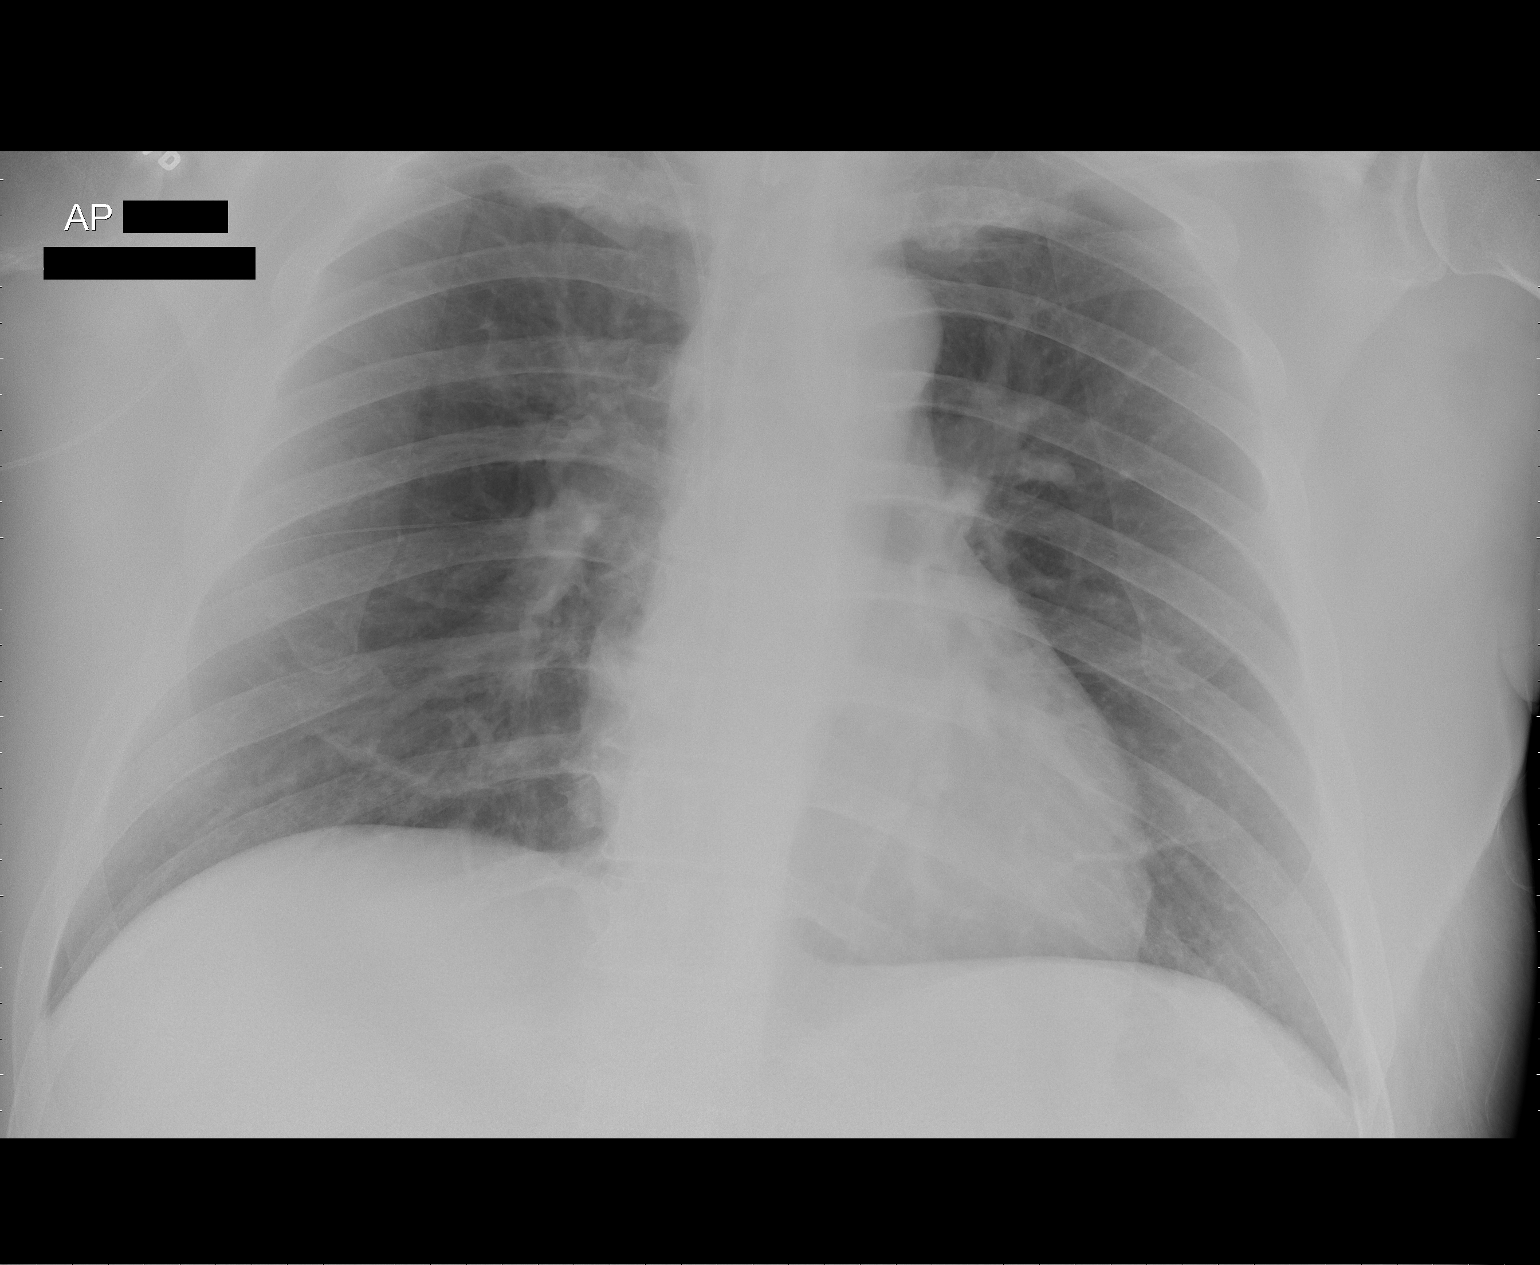

[1 of 1 positions shown; findings below may reference images not displayed]

FINDINGS: Right arm PICC line is in place with the tip in the SVC
in good position.  The lungs are clear without infiltrate or
effusion.
IMPRESSION: Satisfactory PICC line placement.

## 2010-10-23 ENCOUNTER — Telehealth (INDEPENDENT_AMBULATORY_CARE_PROVIDER_SITE_OTHER): Payer: Self-pay | Admitting: Internal Medicine

## 2010-10-25 ENCOUNTER — Encounter (INDEPENDENT_AMBULATORY_CARE_PROVIDER_SITE_OTHER): Payer: Self-pay | Admitting: *Deleted

## 2010-11-06 ENCOUNTER — Encounter (INDEPENDENT_AMBULATORY_CARE_PROVIDER_SITE_OTHER): Payer: Self-pay | Admitting: Internal Medicine

## 2010-12-12 ENCOUNTER — Encounter (INDEPENDENT_AMBULATORY_CARE_PROVIDER_SITE_OTHER): Payer: Self-pay | Admitting: Internal Medicine

## 2010-12-12 ENCOUNTER — Telehealth (INDEPENDENT_AMBULATORY_CARE_PROVIDER_SITE_OTHER): Payer: Self-pay | Admitting: *Deleted

## 2010-12-28 NOTE — Letter (Signed)
Summary: *HSN Results Follow up  [facility]  [address] [place], [place] [address]   Phone: [phone]  Fax: [phone]      07/21/2010   Caleb Joseph [address] [place], [place]  [address]   Dear  Mr. Caleb Joseph,                            ____S.Drinkard,FNP   ____D. Gore,FNP       ____B. McPherson,MD   ____V. Rankins,MD    __X__E. Mulberry,MD    ____N. Daphine Deutscher, FNP  ____D. Reche Dixon, MD    ____K. Philipp Deputy, MD    ____Other     This letter is to inform you that your recent test(s):  _______Pap Smear    _______Lab Test     _______X-ray    _______ is within acceptable limits  _______ requires a medication change  _______ requires a follow-up lab visit  _______ requires a follow-up visit with your [name]   Comments:  I being trying to get in contact with you by phone I leave you a few message in your voice mail . Please call me is regarding to your appt with Neurosurgeon  Thank you  and Have a Nice Day       _________________________________________________________ If you have any questions, please contact our office                     Sincerely,  Cheryll Dessert [facility]

## 2010-12-28 NOTE — Assessment & Plan Note (Signed)
Summary: f/u appt////cns   Vital Signs:  Patient profile:   55 year old male Height:      75 inches Weight:      268 pounds BMI:     33.62 Temp:     98.0 degrees F oral Pulse rate:   85 / minute Pulse rhythm:   regular Resp:     18 per minute BP sitting:   154 / 96  (left arm) BP standing:   142 / 81  (left arm) Cuff size:   large  Vitals Entered By: [place] Shannon (February 03, 2010 2:05 PM) CC: f/u..... Is Patient Diabetic? Yes Pain Assessment Patient in pain? no      CBG Result 321  Does patient need assistance? Functional Status Self care Ambulation Normal   Primary Care Provider:  Julieanne Manson MD  CC:  f/u......  History of Present Illness: 1.  Hypotension:  Felt secondary to autonomic dysfunction and poor control of diabetes with dehydration.  Still not drinking much water.  Drinking 60 oz of coffee fairly regularly--maybe more.  Does not seem particularly interested in making changes with coffee intake that may be helpful. States fell to floor day after he was seen here for BP check--2/25--not really clear he passed out--states it lasted a couple of seconds.  No palpitations, no light headedness, no vertigo.  Had gotten up early to use the bathroom--states always stumbles around early in the morning.  No definite vertigo.  Wears support stockings during the day.  Cannot remember if he sat up quickly or not when this occurred.  Has not had a problem since that day.  2.  DM:   Not taking Lantus on regular basis--possibly a bit better.  Missing meals.    Current Medications (verified): 1)  Neurontin 600 Mg  Tabs (Gabapentin) .Marland Kitchen.. 1 Capsule By Mouth At Night 2)  Lipitor 20 Mg  Tabs (Atorvastatin Calcium) .Marland Kitchen.. 1 Tab By Mouth Daily 3)  Lantus Solostar 100 Unit/ml  Soln (Insulin Glargine) .... 55 Units Subcutaneously Qhs 4)  Albuterol 90 Mcg/act  Aers (Albuterol) .... 2 Puffs Q 4h As Needed Wheeze, Tightness 5)  Cymbalta 30 Mg Cpep (Duloxetine Hcl) .... 2 Caps By Mouth  Q Hs 6)  Vitamin B-12 1000 Mcg Tabs (Cyanocobalamin) .Marland Kitchen.. 1 Tab By Mouth Daily 7)  Fexofenadine Hcl 180 Mg Tabs (Fexofenadine Hcl) .Marland Kitchen.. 1 Tab By Mouth Daily 8)  Depakote Er 500 Mg Xr24h-Tab (Divalproex Sodium) .Marland Kitchen.. 1 By Mouth At Bedtime 9)  Glucometer Test Strips .... Test Three Times A Day 10)  Ferrous Sulfate 325 (65 Fe) Mg Tbec (Ferrous Sulfate) .Marland Kitchen.. 1 Tab By Mouth Two Times A Day 11)  Lovaza 1 Gm Caps (Omega-3-Acid Ethyl Esters) .... 4 Caps By Mouth Daily 12)  Actos 45 Mg Tabs (Pioglitazone Hcl) .Marland Kitchen.. 1 Tab By Mouth Daily 13)  Doxycycline Hyclate 100 Mg Caps (Doxycycline Hyclate) .... Take 1 Tablet By Mouth Two Times A Day For Another 3 Weeks 14)  Norvasc 10 Mg Tabs (Amlodipine Besylate) .... Take 1 Tablet By Mouth Once A Day 15)  Metoprolol Tartrate 25 Mg Tabs (Metoprolol Tartrate) .... Take 1 Tablet By Mouth Two Times A Day 16)  Novolog Flexpen 100 Unit/ml Soln (Insulin Aspart) .... As Directed Per Sliding Scale  Allergies (verified): No Known Drug Allergies  Physical Exam  General:  NAD, unkempt,  Lungs:  Normal respiratory effort, chest expands symmetrically. Lungs are clear to auscultation, no crackles or wheezes. Heart:  Normal rate and regular rhythm.  S1 and S2 normal without gallop, murmur, click, rub or other extra sounds.  Radial pulses normal and equal   Impression & Recommendations:  Problem # 1:  DIABETES MELLITUS, TYPE II, ON INSULIN, UNCONTROLLED (ICD-250.72) Still poorly controlled. Discussed again, very difficult to help him with changes in meds when he is so erratic with his use of Lantus and his eating schedule. Encouaged him to  be compliant.  His updated medication list for this problem includes:    Lantus Solostar 100 Unit/ml Soln (Insulin glargine) .Marland KitchenMarland KitchenMarland KitchenMarland Kitchen 55 units subcutaneously qhs    Actos 45 Mg Tabs (Pioglitazone hcl) .Marland Kitchen... 1 tab by mouth daily    Novolog Flexpen 100 Unit/ml Soln (Insulin aspart) .Marland Kitchen... As directed per sliding scale  Problem # 2:  SYNCOPE  (ICD-780.2) Does not sound like true Syncope--more like a balance issue Does have a cane at home--will try to keep by bedside for when he gets up to use bathroom.    Problem # 3:  HYPERTENSION (ICD-401.9) BP up now--start back with low dose Beta blocker His updated medication list for this problem includes:    Norvasc 10 Mg Tabs (Amlodipine besylate) .Marland Kitchen... Take 1 tablet by mouth once a day    Metoprolol Tartrate 25 Mg Tabs (Metoprolol tartrate) .Marland Kitchen... Take1/2  tablet by mouth two times a day  Complete Medication List: 1)  Neurontin 600 Mg Tabs (Gabapentin) .Marland Kitchen.. 1 capsule by mouth at night 2)  Lipitor 20 Mg Tabs (Atorvastatin calcium) .Marland Kitchen.. 1 tab by mouth daily 3)  Lantus Solostar 100 Unit/ml Soln (Insulin glargine) .... 55 units subcutaneously qhs 4)  Albuterol 90 Mcg/act Aers (Albuterol) .... 2 puffs q 4h as needed wheeze, tightness 5)  Cymbalta 30 Mg Cpep (Duloxetine hcl) .... 2 caps by mouth q hs 6)  Vitamin B-12 1000 Mcg Tabs (Cyanocobalamin) .Marland Kitchen.. 1 tab by mouth daily 7)  Fexofenadine Hcl 180 Mg Tabs (Fexofenadine hcl) .Marland Kitchen.. 1 tab by mouth daily 8)  Depakote Er 500 Mg Xr24h-tab (Divalproex sodium) .Marland Kitchen.. 1 by mouth at bedtime 9)  Glucometer Test Strips  .... Test three times a day 10)  Ferrous Sulfate 325 (65 Fe) Mg Tbec (Ferrous sulfate) .Marland Kitchen.. 1 tab by mouth two times a day 11)  Lovaza 1 Gm Caps (Omega-3-acid ethyl esters) .... 4 caps by mouth daily 12)  Actos 45 Mg Tabs (Pioglitazone hcl) .Marland Kitchen.. 1 tab by mouth daily 13)  Doxycycline Hyclate 100 Mg Caps (Doxycycline hyclate) .... Take 1 tablet by mouth two times a day for another 3 weeks 14)  Norvasc 10 Mg Tabs (Amlodipine besylate) .... Take 1 tablet by mouth once a day 15)  Metoprolol Tartrate 25 Mg Tabs (Metoprolol tartrate) .... Take1/2  tablet by mouth two times a day 16)  Novolog Flexpen 100 Unit/ml Soln (Insulin aspart) .... As directed per sliding scale  Patient Instructions: 1)  Take Lantus every day 2)  Take Metoprolol 1/2 tab by  mouth two times a day  3)  Keep appt. in 2 weeks with Dr. Delrae Alfred

## 2010-12-28 NOTE — Consult Note (Signed)
Summary: [facility]  [facility]   Imported By: Florinda Marker 02/02/2010 15:10:45  _____________________________________________________________________  External Attachment:    Type:   Image     Comment:   External Document

## 2010-12-28 NOTE — Progress Notes (Signed)
Summary: NEUROSURGEON REFERRAL   Phone Note Outgoing Call   Summary of Call: Nora--please see letter and order for referral to Dr. Roy--pt. has not heard from them or [place] regarding this yet. Initial call taken by: Julieanne Manson MD,  July 04, 2010 1:17 PM  Follow-up for Phone Call        I CALL [name]  TO FIND THE STATUS OF THE REFERRAL THEY TOLD ME THAT HE HAVE A APPT 08-23-10 @ 9AM  I LVM TO PT TO CALL ME BACK  Follow-up by: Cheryll Dessert,  July 06, 2010 11:28 AM

## 2010-12-28 NOTE — Progress Notes (Signed)
  Phone Note Other Incoming   Summary of Call: A MAN NAME CHARLES SIMMONS CALLED AND SAID HE WAS LEFT 3 MESSAGES FOR Lessie Hisaw TO CONTACT OFFICE.HE DOESN'T KNOW MR [employer] AND DOESN'T KNOW HOW THIS NUMBER WAS PU TON MR Berkey'S CHART. I LOOKED AND DID NOT SEE THIS NUMBER ANYWHERE IN HIS CHART [phone]/BUT HE ASK THAT WE NOT CALL THAT NUMBER ANYMORE Initial call taken by: Arta Bruce,  December 12, 2010 12:09 PM

## 2010-12-28 NOTE — Letter (Signed)
Summary: REQUESTING RECORDS FROM //DR.VAN DAM   REQUESTING RECORDS FROM //DR.VAN DAM   Imported By: Arta Bruce 01/04/2010 15:05:52  _____________________________________________________________________  External Attachment:    Type:   Image     Comment:   External Document

## 2010-12-28 NOTE — Assessment & Plan Note (Signed)
Summary: 4 MONTH FU///KT   Vital Signs:  Patient profile:   55 year old male Weight:      266.13 pounds Temp:     97.9 degrees F Pulse rate:   62 / minute Pulse rhythm:   regular Resp:     16 per minute BP sitting:   164 / 89  (left arm) BP standing:   138 / 82  (left arm) Cuff size:   large  Vitals Entered By: Chauncy Passy, SMA CC: Pt. is here for a 83month f/u from 10/15/10 for his DM.  Is Patient Diabetic? Yes Did you bring your meter with you today? Yes Pain Assessment Patient in pain? no      CBG Result 253  Does patient need assistance? Functional Status Self care Ambulation Normal   Primary Care Provider:  Julieanne Manson MD  CC:  Pt. is here for a 83month f/u from 10/15/10 for his DM. Marland Kitchen  History of Present Illness: 1.  Hypertension:  No more episodes of dizziness.  Taking Metoprolol 1/2 tab by mouth two times a day now.  Heart rates at home running 56-80, but generally in low 60s.    2.  Peripheral Neuropathy:  Wants to increase Neurontin as increasing discomfort.  Discussed improving diabetic control will also help.  3.  DM:  MIssed Lantus 11 days since 3/16.  Discussed I am unable to tell him how to manipulate Lantus as he misses his dose so frequently.  Pt. admits to skipping his dose as he is afraid his sugars will drop overnight, even when evening blood sugar is over 200.    Current Medications (verified): 1)  Neurontin 600 Mg  Tabs (Gabapentin) .Marland Kitchen.. 1 Capsule By Mouth At Night 2)  Lipitor 20 Mg  Tabs (Atorvastatin Calcium) .Marland Kitchen.. 1 Tab By Mouth Daily 3)  Lantus Solostar 100 Unit/ml  Soln (Insulin Glargine) .... 55 Units Subcutaneously Qhs 4)  Albuterol 90 Mcg/act  Aers (Albuterol) .... 2 Puffs Q 4h As Needed Wheeze, Tightness 5)  Cymbalta 30 Mg Cpep (Duloxetine Hcl) .... 2 Caps By Mouth Q Hs 6)  Vitamin B-12 1000 Mcg Tabs (Cyanocobalamin) .Marland Kitchen.. 1 Tab By Mouth Daily 7)  Fexofenadine Hcl 180 Mg Tabs (Fexofenadine Hcl) .Marland Kitchen.. 1 Tab By Mouth Daily 8)  Depakote  Er 500 Mg Xr24h-Tab (Divalproex Sodium) .Marland Kitchen.. 1 By Mouth At Bedtime 9)  Glucometer Test Strips .... Test Three Times A Day 10)  Ferrous Sulfate 325 (65 Fe) Mg Tbec (Ferrous Sulfate) .Marland Kitchen.. 1 Tab By Mouth Two Times A Day 11)  Lovaza 1 Gm Caps (Omega-3-Acid Ethyl Esters) .... 4 Caps By Mouth Daily 12)  Actos 45 Mg Tabs (Pioglitazone Hcl) .Marland Kitchen.. 1 Tab By Mouth Daily 13)  Doxycycline Hyclate 100 Mg Caps (Doxycycline Hyclate) .... Take 1 Tablet By Mouth Two Times A Day For Another 3 Weeks 14)  Norvasc 10 Mg Tabs (Amlodipine Besylate) .... Take 1 Tablet By Mouth Once A Day 15)  Metoprolol Tartrate 25 Mg Tabs (Metoprolol Tartrate) .... Take1/2  Tablet By Mouth Two Times A Day 16)  Novolog Flexpen 100 Unit/ml Soln (Insulin Aspart) .... As Directed Per Sliding Scale  Allergies (verified): No Known Drug Allergies  Physical Exam  General:  NAD Lungs:  Normal respiratory effort, chest expands symmetrically. Lungs are clear to auscultation, no crackles or wheezes. Heart:  Normal rate and regular rhythm. S1 and S2 normal without gallop, murmur, click, rub or other extra sounds.  Radial pulses normal and equal.   Impression &  Recommendations:  Problem # 1:  DIABETES MELLITUS, TYPE II, ON INSULIN, UNCONTROLLED (ICD-250.72) A1C better, but once again, encouraged pt. to take insulin so could get an idea where to go with his dose--missed more than he takes currently His updated medication list for this problem includes:    Lantus Solostar 100 Unit/ml Soln (Insulin glargine) .Marland KitchenMarland KitchenMarland KitchenMarland Kitchen 55 units subcutaneously qhs    Actos 45 Mg Tabs (Pioglitazone hcl) .Marland Kitchen... 1 tab by mouth daily    Novolog Flexpen 100 Unit/ml Soln (Insulin aspart) .Marland Kitchen... As directed per sliding scale  Problem # 2:  ORTHOSTATIC HYPOTENSION (ICD-458.0) No further episodes of near syncope. Wean Metoprolol as concerned he may not be mounting and increased pulse when he needs--bps still show some orthostasis, which is likely multifactorial  Problem # 3:   HYPERTENSION (ICD-401.9) Wean Metoprolol over next few days and start Norvasc. The following medications were removed from the medication list:    Metoprolol Tartrate 25 Mg Tabs (Metoprolol tartrate) .Marland Kitchen... Take1/2  tablet by mouth two times a day His updated medication list for this problem includes:    Norvasc 10 Mg Tabs (Amlodipine besylate) .Marland Kitchen... Take 1 tablet by mouth once a day  Complete Medication List: 1)  Neurontin 600 Mg Tabs (Gabapentin) .Marland Kitchen.. 1 capsule by mouth at night 2)  Lipitor 20 Mg Tabs (Atorvastatin calcium) .Marland Kitchen.. 1 tab by mouth daily 3)  Lantus Solostar 100 Unit/ml Soln (Insulin glargine) .... 55 units subcutaneously qhs 4)  Albuterol 90 Mcg/act Aers (Albuterol) .... 2 puffs q 4h as needed wheeze, tightness 5)  Cymbalta 30 Mg Cpep (Duloxetine hcl) .... 2 caps by mouth q hs 6)  Vitamin B-12 1000 Mcg Tabs (Cyanocobalamin) .Marland Kitchen.. 1 tab by mouth daily 7)  Fexofenadine Hcl 180 Mg Tabs (Fexofenadine hcl) .Marland Kitchen.. 1 tab by mouth daily 8)  Depakote Er 500 Mg Xr24h-tab (Divalproex sodium) .Marland Kitchen.. 1 by mouth at bedtime 9)  Glucometer Test Strips  .... Test three times a day 10)  Ferrous Sulfate 325 (65 Fe) Mg Tbec (Ferrous sulfate) .Marland Kitchen.. 1 tab by mouth two times a day 11)  Lovaza 1 Gm Caps (Omega-3-acid ethyl esters) .... 4 caps by mouth daily 12)  Actos 45 Mg Tabs (Pioglitazone hcl) .Marland Kitchen.. 1 tab by mouth daily 13)  Doxycycline Hyclate 100 Mg Caps (Doxycycline hyclate) .... Take 1 tablet by mouth two times a day for another 3 weeks 14)  Norvasc 10 Mg Tabs (Amlodipine besylate) .... Take 1 tablet by mouth once a day 15)  Novolog Flexpen 100 Unit/ml Soln (Insulin aspart) .... As directed per sliding scale  Patient Instructions: 1)  Decrease Metoprolol to once daily starting today and then stop completely in 2 days 2)  Restart Norvasc. 3)  BP check with pulse and orthostatics in 2 weeks--nurse visit. 4)  Follow up with Dr. Delrae Alfred in 3 months --DM, hypertension Prescriptions: NORVASC 10 MG  TABS (AMLODIPINE BESYLATE) Take 1 tablet by mouth once a day  #30 x 11   Entered and Authorized by:   Julieanne Manson MD   Signed by:   Julieanne Manson MD on 02/21/2010   Method used:   Faxed to ...       [facility] (retail)       [address] [place], [place]  [address]       Ph: [phone]       Fax: [phone]   RxID:   [id number]   Appended Document: 4 MONTH FU///KT  Laboratory Results   Blood Tests     HGBA1C: 9.5%   (Normal Range: Non-Diabetic - 3-6%   Control Diabetic - 6-8%)      Appended Document: 4 MONTH FU///KT    Clinical Lists Changes  Medications: Changed medication from NEURONTIN 600 MG  TABS (GABAPENTIN) 1 capsule by mouth at night to NEURONTIN 600 MG  TABS (GABAPENTIN) 1 capsule by mouth two times a day - Signed Rx of NEURONTIN 600 MG  TABS (GABAPENTIN) 1 capsule by mouth two times a day;  #60 x 11;  Signed;  Entered by: Julieanne Manson MD;  Authorized by: Julieanne Manson MD;  Method used: Print then Give to Patient    Prescriptions: NEURONTIN 600 MG  TABS (GABAPENTIN) 1 capsule by mouth two times a day  #60 x 11   Entered and Authorized by:   Julieanne Manson MD   Signed by:   Julieanne Manson MD on 02/21/2010   Method used:   Print then Give to Patient   RxID:   [id number]

## 2010-12-28 NOTE — Letter (Signed)
Summary: *HSN Results Follow up  [facility]  [address] [place], [place] [address]   Phone: [phone]  Fax: [phone]      10/25/2010   Fernanda Drum [address] [place], [place]  [address]   Dear  Mr. RIVAAN KENDALL,                            ____S.Drinkard,FNP   ____D. Gore,FNP       ____B. McPherson,MD   ____V. Rankins,MD    ____E. Mulberry,MD    ____N. Daphine Deutscher, FNP  ____D. Reche Dixon, MD    ____K. Philipp Deputy, MD    ____Other     This letter is to inform you that your recent test(s):  _______Pap Smear    _______Lab Test     _______X-ray    _______ is within acceptable limits  _______ requires a medication change  _______ requires a follow-up lab visit  _______ requires a follow-up visit with your provider   Comments: We have been trying to reach you.  Please contact the office.       _________________________________________________________ If you have any questions, please contact our office                     Sincerely,  [place] Shannon [facility]

## 2010-12-28 NOTE — Assessment & Plan Note (Signed)
Summary: dizzy/bp dropping /tmm   Vital Signs:  Patient profile:   55 year old male Height:      75 inches Weight:      259 pounds BMI:     32.49 Temp:     98.1 degrees F oral Pulse rate:   90 / minute Pulse (ortho):   93 / minute Pulse rhythm:   regular Resp:     18 per minute BP sitting:   125 / 80  (left arm) BP standing:   111 / 88 Cuff size:   large  Vitals Entered By: Arm[place] Shannon (January 10, 2010 9:57 AM)  Serial Vital Signs/Assessments:  Time      Position  BP       Pulse  Resp  Temp     By 10:25 AM  Lying LA  122/82   100                   Armenia Shannon 10:25 AM  Sitting   118/86   99                    Armenia Shannon 10:25 AM  Standing  111/88   93                    Armenia Shannon  CC: pt says his bp has been dropping...Marland KitchenMarland KitchenMarland Kitchen Is Patient Diabetic? Yes Pain Assessment Patient in pain? no      CBG Result 241  Does patient need assistance? Functional Status Self care Ambulation Normal   Primary Care Provider:  Julieanne Manson MD  CC:  pt says his bp has been dropping.......  History of Present Illness: 55 year old male with diabetes presents for lightheadedness and low blood pressures.  He was admitted to [facility] back in December.  It was felt he had syncope secondary to orthostasis.  His medications were adjusted.  He was hydrated with IV fluids.  Dr. Delrae Alfred has not seen him since that time.  He was following up with the infectious disease specialist for his right fifth toe infection and osteomyelitis.  Of note, he had this toe amputated.  He has a stage I diabetic ulcer at the head of the fifth metatarsal that is being followed by a podiatrist.  He is currently on doxycycline for this.  He sees Dr. Stacie Acres.  He keeps it dressed on his own and follows up with him quite frequently.  He notes that he has started to feel lightheaded recently with standing.  He says that he had a syncopal episode yesterday.  This occurred shortly after standing.  He was  walking down his hallway.  He denies hitting his head or suffering any injuries.  He is currently taking Norvasc and metoprolol.  His sugars are completely out of control.  He brings a list of his readings and they have ranged in the 200-300 range.  He has had some readings in the 400-500 range as well.  He notes that his urination has increased.  He denies increased thirst.  He did have some blood pressure readings in the 80s to 90s systolically.  He was symptomatic with this.  He continues to feel lightheaded at times.  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) 11)  Chronic Osteomyelitis Right Foot  (ICD-730.17) 12)  Dyspnea  (ICD-786.05) 13)  Pvd  (ICD-443.9) 14)  Peripheral Edema  (ICD-782.3) 15)  Hyperlipidemia  (ICD-272.4) 16)  Hypertension  (ICD-401.9) 17)  Acute Kidney Failure Unspecified  (ICD-584.9) 18)  Hypotension  (ICD-458.9) 19)  Health Maintenance Exam  (ICD-V70.0) 20)  Chronic Right Foot Ulcer  (ICD-707.10) 21)  Noncompliance  (ICD-V15.81) 22)  Hyperkalemia  (ICD-276.7) 23)  Presyncope  (ICD-780.2) 24)  Abscess, Prostate Gland  (ICD-601.2) 25)  Viral Infection  (ICD-079.99) 26)  Dehydration  (ICD-276.51) 27)  Urinary Tract Infection  (ICD-599.0) 28)  Diabetic Macular Edema  (ICD-362.07) 29)  Anemia, Vitamin B12 Deficiency  (ICD-281.1) 30)  Dental Caries  (ICD-521.00) 31)  Hypokalemia  (ICD-276.8) 32)  Anemia  (ICD-285.9) 33)  Weight Loss, Abnormal  (ICD-783.21) 34)  Depression   (ICD-311) 35)  Bronchitis, Acute With Bronchospasm  (ICD-466.0) 36)  Encounter For Long-term Use of Other Medications  (ICD-V58.69) 37)  Viral Infection  (ICD-079.99) 38)  Frequency, Urinary  (ICD-788.41) 39)  Inguinal Pain, Right  (ICD-789.09) 40)  Oligospermia  (ICD-606.1) 41)  Diabetic Peripheral Neuropathy  (ICD-250.60) 42)  Diabetes Mellitus, Type II, On Insulin, Uncontrolled  (ICD-250.72)  Current Medications (verified): 1)  Neurontin 600 Mg  Tabs (Gabapentin) .Marland Kitchen.. 1 Capsule By Mouth At Night 2)  Lipitor 20 Mg  Tabs (Atorvastatin Calcium) .Marland Kitchen.. 1 Tab By Mouth Daily 3)  Lantus Solostar 100 Unit/ml  Soln (Insulin Glargine) .... 55 Units Subcutaneously Qhs 4)  Albuterol 90 Mcg/act  Aers (Albuterol) .... 2 Puffs Q 4h As Needed Wheeze, Tightness 5)  Cymbalta 30 Mg Cpep (Duloxetine Hcl) .... 2 Caps By Mouth Q Hs 6)  Vitamin B-12 1000 Mcg Tabs (Cyanocobalamin) .Marland Kitchen.. 1 Tab By Mouth Daily 7)  Fexofenadine Hcl 180 Mg Tabs (Fexofenadine Hcl) .Marland Kitchen.. 1 Tab By Mouth Daily 8)  Depakote Er 500 Mg Xr24h-Tab (Divalproex Sodium) .Marland Kitchen.. 1 By Mouth At Bedtime 9)  Glucometer Test Strips .... Test Three Times A Day 10)  Ferrous Sulfate 325 (65 Fe) Mg Tbec (Ferrous Sulfate) .Marland Kitchen.. 1 Tab By Mouth Two Times A Day 11)  Lovaza 1 Gm Caps (Omega-3-Acid Ethyl Esters) .... 4 Caps By Mouth Daily 12)  Actos 45 Mg Tabs (Pioglitazone Hcl) .Marland Kitchen.. 1 Tab By Mouth Daily 13)  Doxycycline Hyclate 100 Mg Caps (Doxycycline Hyclate) .... Take 1 Tablet By Mouth Two Times A Day For Another 3 Weeks  Allergies (verified): No Known Drug Allergies  Past History:  Past Medical History: Last updated: 11/22/2009 Current Problems:  PERIPHERAL EDEMA (ICD-782.3) HYPERLIPIDEMIA (ICD-272.4) HYPERTENSION (ICD-401.9) ACUTE KIDNEY FAILURE UNSPECIFIED (ICD-584.9) HYPOTENSION (ICD-458.9) HEALTH MAINTENANCE EXAM (ICD-V70.0) CHRONIC RIGHT FOOT ULCER (ICD-707.10) NONCOMPLIANCE (ICD-V15.81) HYPERKALEMIA (ICD-276.7) PRESYNCOPE  (ICD-780.2) ABSCESS, PROSTATE GLAND (ICD-601.2) VIRAL INFECTION (ICD-079.99) DEHYDRATION (ICD-276.51) URINARY TRACT INFECTION (ICD-599.0) DIABETIC MACULAR EDEMA (ICD-362.07) ANEMIA, VITAMIN B12 DEFICIENCY (ICD-281.1) DENTAL CARIES (ICD-521.00) HYPOKALEMIA (ICD-276.8) ANEMIA (ICD-285.9) WEIGHT LOSS, ABNORMAL (ICD-783.21) DEPRESSION (ICD-311) BRONCHITIS, ACUTE WITH BRONCHOSPASM (ICD-466.0) ENCOUNTER FOR LONG-TERM USE OF OTHER MEDICATIONS (ICD-V58.69) VIRAL INFECTION (ICD-079.99) FREQUENCY, URINARY (ICD-788.41) INGUINAL PAIN, RIGHT (ICD-789.09) OLIGOSPERMIA (ICD-606.1) DIABETIC PERIPHERAL NEUROPATHY (ICD-250.60) DIABETES MELLITUS, TYPE II, ON INSULIN, UNCONTROLLED (ICD-250.72) Osteomyelitis Right foot s/p 5th Ray amputation [id number]  Past Surgical History: Last updated: 11/22/2009 1.  2000:  Great toe  amputation for chronic ulcer and osteomyelitis 2.  2006 I and D of chronic right lateral foot infection.  Dr. Darrelyn Hillock. 3. 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 Rennis Chris  Family History: Last updated: 04-25-2009 Mother, died age 51:  "old age".  DM, hypertension and many related ailments Father, died age 31:  Murdered--beaten to death.  Healthy Brother 43:  Healthy No children  Review of Systems      See HPI General:  Denies chills and fever. CV:  Denies chest pain or discomfort, shortness of breath with exertion, and swelling of feet. Resp:  Denies cough and coughing up blood. GI:  Denies bloody stools, dark tarry stools, and diarrhea. GU:  Denies dysuria and hematuria. Neuro:  Denies seizures.  Physical Exam  General:  alert, well-developed, and well-nourished.   Head:  normocephalic and atraumatic.   Eyes:  no injection.   Neck:  supple, no thyromegaly, no JVD, no carotid bruits, and no cervical lymphadenopathy.   Lungs:  normal breath sounds, no crackles, and no wheezes.   Heart:  normal rate, regular rhythm, and no murmur.   Abdomen:  soft, non-tender, and no  hepatomegaly.   Extremities:  no edema Neurologic:  alert & oriented X3 and cranial nerves II-XII intact.   Skin:  turgor normal.   right foot wound inspected: good granulation tissue, stage I, no drainage, no erythema Psych:  normally interactive.     Impression & Recommendations:  Problem # 1:  SYNCOPE (ICD-780.2)  Suspect this is still secondary to orthostasis.  He brings in a meticulous recording of his blood pressures.  He does have some readings in the 80s to 90s and he did feel symptomatic with this.  His sugars are completely out of control.  I suspect that he is somewhat dehydrated.  His orthostatic vital signs in the office, however, do not appear abnormal.  I have recommended that he hold his Norvasc and metoprolol.  Of note, he has not taken the metoprolol or Norvasc in the last 24 hours. As noted above, his blood pressure is optimal.  I have recommended that he increase his Lantus to 60 units q.h.s.  He will increase it by 2 units every 2 days if his a.m. sugar is greater than 200.  We'll check labs today.  We'll have him followup closely with Dr. Delrae Alfred in one week.  He should return sooner if needed.  Ideally, he should not drive.  He had this has been explained to him.  However, he notes that he has to drive.  I will give him a note to remain out of work for the next 2 days.  Consider autonomic dysfunction with his diabetes.  I advised him to continue to wear support hose as much as possible.  Of note, his ECG was without significant change today.   Orders: T-Comprehensive Metabolic Panel [id number]) T-CBC w/Diff ([id number]) T-TSH [phone])  Problem # 2:  DIABETES MELLITUS, TYPE II, WITH HYPEROSMOLARITY (ICD-250.20) uncontrolled adjust meds as outlined above  His updated medication list for this problem includes:    Lantus Solostar 100 Unit/ml Soln (Insulin glargine) .Marland KitchenMarland KitchenMarland KitchenMarland Kitchen 55 units subcutaneously qhs    Actos 45 Mg Tabs (Pioglitazone hcl) .Marland Kitchen... 1 tab by mouth  daily  Problem # 3:  CHRONIC RIGHT FOOT ULCER (ICD-707.10) continue follow up with podiatrist  Complete Medication List: 1)  Neurontin 600 Mg Tabs (Gabapentin) .Marland Kitchen.. 1 capsule by mouth at night 2)  Lipitor 20 Mg Tabs (Atorvastatin calcium) .Marland Kitchen.. 1 tab by mouth  daily 3)  Lantus Solostar 100 Unit/ml Soln (Insulin glargine) .... 55 units subcutaneously qhs 4)  Albuterol 90 Mcg/act Aers (Albuterol) .... 2 puffs q 4h as needed wheeze, tightness 5)  Cymbalta 30 Mg Cpep (Duloxetine hcl) .... 2 caps by mouth q hs 6)  Vitamin B-12 1000 Mcg Tabs (Cyanocobalamin) .Marland Kitchen.. 1 tab by mouth daily 7)  Fexofenadine Hcl 180 Mg Tabs (Fexofenadine hcl) .Marland Kitchen.. 1 tab by mouth daily 8)  Depakote Er 500 Mg Xr24h-tab (Divalproex sodium) .Marland Kitchen.. 1 by mouth at bedtime 9)  Glucometer Test Strips  .... Test three times a day 10)  Ferrous Sulfate 325 (65 Fe) Mg Tbec (Ferrous sulfate) .Marland Kitchen.. 1 tab by mouth two times a day 11)  Lovaza 1 Gm Caps (Omega-3-acid ethyl esters) .... 4 caps by mouth daily 12)  Actos 45 Mg Tabs (Pioglitazone hcl) .Marland Kitchen.. 1 tab by mouth daily 13)  Doxycycline Hyclate 100 Mg Caps (Doxycycline hyclate) .... Take 1 tablet by mouth two times a day for another 3 weeks 14)  Norvasc 10 Mg Tabs (Amlodipine besylate) .... Take 1 tablet by mouth once a day 15)  Metoprolol Tartrate 25 Mg Tabs (Metoprolol tartrate) .... Take 1 tablet by mouth two times a day  Patient Instructions: 1)  Do not take norvasc or metoprolol for now. 2)  Call us if your blood pressure goes over 140/90. 3)  Drink plenty of fluids (water) over the next 2 days. 4)  Get plenty of rest. 5)  You really should not drive for now with a history of passing out. 6)  Schedule a follow up appointment with Dr. Delrae Alfred in one week or sooner if you feel worse.   7)  Increase your Lantus to 60 units at bedtime. 8)  Make sure you check your sugar every morning before eating. 9)  Increase Lantus by 2 units every other day if your sugar is above 200 in the  morning before you eat.   EKG  Procedure date:  01/10/2010  Findings:      Normal sinus rhythm with rate of:  77 LAFB no isch changes intervals normal no sig change since 11/22/2009

## 2010-12-28 NOTE — Assessment & Plan Note (Signed)
Summary: 3 MONTH FU DM/HTN/////KT   Vital Signs:  Patient profile:   55 year old male Weight:      255 pounds Temp:     97.9 degrees F Pulse rate:   76 / minute Pulse (ortho):   82 / minute Pulse rhythm:   regular Resp:     16 per minute BP sitting:   120 / 70  (left arm) BP standing:   93 / 63 Cuff size:   large  Vitals Entered By: Vesta Mixer CMA (June 02, 2010 12:32 PM)  Serial Vital Signs/Assessments:  Time      Position  BP       Pulse  Resp  Temp     By           Standing  93/63    82                    Tiffany McCoy CMA  CC: f/u htn/dm, needs refill of his inhaler. Is Patient Diabetic? Yes CBG Result 183  Does patient need assistance? Ambulation Normal   Primary Care Provider:  Julieanne Manson MD  CC:  f/u htn/dm and needs refill of his inhaler..  History of Present Illness: 1.  Presyncopal episodes:  Missing Norvasc 2-3 times weekly.  Has noted sugars are higher.  Denies polyuria and polydipsia.  He dips snuff and so often would not note a dry mouth.  Is not thirsty.  Has been going a week at a time without his Lantus.  BP last 2 checks in low normal range.    2.  Depression:  On Depakote--needs Valproic acid level sent attn:  Janae Sauce, M.D.  Fax is [phone].    3.  Hyperlipidemia:  fasting today.  4.  Numbness in right ulnar hand for 3-4 months:  chronic at this point.  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  Feels it puts pressure on ulnar wrist and that is where the discomfort originates.  Not able to switch to left hand as not coordinated enough to use.  Pt. wears a thick rubber band on arm and that seems to help with the pain.  If he taps on ulnar wrist, gets a shooting pain into ulnar fingers.  Current Medications (verified): 1)  Neurontin 600 Mg  Tabs (Gabapentin) .Marland Kitchen.. 1 Capsule By Mouth Two Times A Day 2)  Lipitor 20 Mg  Tabs (Atorvastatin Calcium) .Marland Kitchen.. 1 Tab By Mouth Daily 3)   Lantus Solostar 100 Unit/ml  Soln (Insulin Glargine) .... 55 Units Subcutaneously Qhs 4)  Albuterol 90 Mcg/act  Aers (Albuterol) .... 2 Puffs Q 4h As Needed Wheeze, Tightness 5)  Cymbalta 30 Mg Cpep (Duloxetine Hcl) .... 2 Caps By Mouth Q Hs 6)  Vitamin B-12 1000 Mcg Tabs (Cyanocobalamin) .Marland Kitchen.. 1 Tab By Mouth Daily 7)  Fexofenadine Hcl 180 Mg Tabs (Fexofenadine Hcl) .Marland Kitchen.. 1 Tab By Mouth Daily 8)  Depakote Er 500 Mg Xr24h-Tab (Divalproex Sodium) .Marland Kitchen.. 1 By Mouth At Bedtime 9)  Glucometer Test Strips .... Test Three Times A Day 10)  Ferrous Sulfate 325 (65 Fe) Mg Tbec (Ferrous Sulfate) .Marland Kitchen.. 1 Tab By Mouth Two Times A Day 11)  Lovaza 1 Gm Caps (Omega-3-Acid Ethyl Esters) .... 4 Caps By Mouth Daily 12)  Actos 45 Mg Tabs (Pioglitazone Hcl) .Marland Kitchen.. 1 Tab By Mouth Daily 13)  Doxycycline Hyclate 100 Mg Caps (Doxycycline Hyclate) .... Take 1 Tablet By Mouth  Two Times A Day For Another 3 Weeks 14)  Norvasc 10 Mg Tabs (Amlodipine Besylate) .... Take 1 Tablet By Mouth Once A Day 15)  Novolog Flexpen 100 Unit/ml Soln (Insulin Aspart) .... As Directed Per Sliding Scale  Allergies (verified): No Known Drug Allergies  Physical Exam  General:  NAD Lungs:  Normal respiratory effort, chest expands symmetrically. Lungs are clear to auscultation, no crackles or wheezes. Heart:  Normal rate and regular rhythm. S1 and S2 normal without gallop, murmur, click, rub or other extra sounds.  Radial pulses normal and equal. Neurologic:  Sensation to light touch of ulnar fingers WNL.  pressure over ulnar wrist basically with positive Tinel's.  No Tinel's at medial elbow.  Hypothenar eminence without flattening.   Impression & Recommendations:  Problem # 1:  DIABETES MELLITUS, TYPE II, WITH HYPEROSMOLARITY (ICD-250.20) Pt. remains very noncompliant Discussed his poor sugar control a very large contributor to dehydration and presyncope His updated medication list for this problem includes:    Lantus Solostar 100 Unit/ml  Soln (Insulin glargine) .Marland KitchenMarland KitchenMarland KitchenMarland Kitchen 55 units subcutaneously qhs    Actos 45 Mg Tabs (Pioglitazone hcl) .Marland Kitchen... 1 tab by mouth daily    Novolog Flexpen 100 Unit/ml Soln (Insulin aspart) .Marland Kitchen... 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 would like this evaluated by Dr. Priscella Mann to pay  Orders: Neurosurgeon Referral (Neurosurgeon)  Problem # 4:  DEPRESSION (ICD-311)  His updated medication list for this problem includes:    Cymbalta 30 Mg Cpep (Duloxetine hcl) .Marland Kitchen... 2 caps by mouth q hs  Orders: T-Valproic Acid (Depakene) ([id number])--fax to provider listed in HPI  Complete Medication List: 1)  Neurontin 600 Mg Tabs (Gabapentin) .Marland Kitchen.. 1 capsule by mouth two times a day 2)  Lipitor 20 Mg Tabs (Atorvastatin calcium) .Marland Kitchen.. 1 tab by mouth daily 3)  Lantus Solostar 100 Unit/ml Soln (Insulin glargine) .... 55 units subcutaneously qhs 4)  Albuterol 90 Mcg/act Aers (Albuterol) .... 2 puffs q 4h as needed wheeze, tightness 5)  Cymbalta 30 Mg Cpep (Duloxetine hcl) .... 2 caps by mouth q hs 6)  Vitamin B-12 1000 Mcg Tabs (Cyanocobalamin) .Marland Kitchen.. 1 tab by mouth daily 7)  Fexofenadine Hcl 180 Mg Tabs (Fexofenadine hcl) .Marland Kitchen.. 1 tab by mouth daily 8)  Depakote Er 500 Mg Xr24h-tab (Divalproex sodium) .Marland Kitchen.. 1 by mouth at bedtime 9)  Glucometer Test Strips  .... Test three times a day 10)  Ferrous Sulfate 325 (65 Fe) Mg Tbec (Ferrous sulfate) .Marland Kitchen.. 1 tab by mouth two times a day 11)  Lovaza 1 Gm Caps (Omega-3-acid ethyl esters) .... 4 caps by mouth daily 12)  Actos 45 Mg Tabs (Pioglitazone hcl) .Marland Kitchen.. 1 tab by mouth daily 13)  Doxycycline Hyclate 100 Mg Caps (Doxycycline hyclate) .... Take 1 tablet by mouth two times a day for another 3 weeks 14)  Norvasc 5 Mg Tabs (Amlodipine besylate) .Marland Kitchen.. 1  tab by mouth daily 15)  Novolog Flexpen 100 Unit/ml Soln (Insulin aspart) .... As directed per sliding scale  Other Orders: Capillary Blood Glucose/CBG (29562) T-Lipid Profile ([id number])  Patient Instructions: 1)  Call if you do not hear from Dr. Temple Pacini office or our referral coordinator 2)  Follow up with Dr. Delrae Alfred in 1 month --presyncope Prescriptions: NORVASC 5 MG TABS (AMLODIPINE BESYLATE) 1 tab by mouth daily  #30 x 11  Entered and Authorized by:   Julieanne Manson MD   Signed by:   Julieanne Manson MD on 06/02/2010   Method used:   Faxed to ...       [facility] (retail)       [address] [place], [place]  [address]       Ph: [phone]       Fax: [phone]   RxID:   [id number] AMLODIPINE BESYLATE 5 MG TABS (AMLODIPINE BESYLATE) 1 tab by mouth daily  #30 x 11   Entered and Authorized by:   Julieanne Manson MD   Signed by:   Julieanne Manson MD on 06/02/2010   Method used:   Faxed to ...       [facility] (retail)       [address] [place], [place]  [address]       Ph: [phone]       Fax: [phone]   RxID:   [id number]   Appended Document: 3 MONTH FU DM/HTN/////KT Please fax to Dr. Janae Sauce at [phone]  Appended Document: 3 MONTH FU DM/HTN/////KT Faxed.

## 2010-12-28 NOTE — Letter (Signed)
Summary: Lipid Letter  [facility]  [address] [place], [place] [address]   Phone: [phone]  Fax: [phone]    06/22/2010  Caleb Joseph [address] [place], [place]  [address]  Dear Caleb Joseph:  We have carefully reviewed your last lipid profile from 06/02/2010 and the results are noted below with a summary of recommendations for lipid management.    Cholesterol:       134     Goal: <200   HDL "good" Cholesterol:   32     Goal: >45   LDL "bad" Cholesterol:   69     Goal: <70   Triglycerides:       165     Goal: <150    Labs were stable.  Cholesterol was okay--would like to see HDL higher and triglycerides a bit lower.    TLC Diet (Therapeutic Lifestyle Change): Saturated Fats & Transfatty acids should be kept < 7% of total calories ***Reduce Saturated Fats Polyunstaurated Fat can be up to 10% of total calories Monounsaturated Fat Fat can be up to 20% of total calories Total Fat should be no greater than 25-35% of total calories Carbohydrates should be 50-60% of total calories Protein should be approximately 15% of total calories Fiber should be at least 20-30 grams a day ***Increased fiber may help lower LDL Total Cholesterol should be < 200mg /day Consider adding plant stanol/sterols to diet (example: Benacol spread) ***A higher intake of unsaturated fat may reduce Triglycerides and Increase HDL    Adjunctive Measures (may lower LIPIDS and reduce risk of Heart Attack) include: Aerobic Exercise (20-30 minutes 3-4 times a week) Limit Alcohol Consumption Weight Reduction Aspirin 75-81 mg a day by mouth (if not allergic or contraindicated) Dietary Fiber 20-30 grams a day by mouth     Current Medications: 1)    Neurontin 600 Mg  Tabs (Gabapentin) .Marland Kitchen.. 1 capsule by mouth two times a day 2)    Lipitor 20 Mg  Tabs (Atorvastatin calcium) .Marland Kitchen.. 1 tab by mouth daily 3)    Lantus Solostar 100 Unit/ml  Soln (Insulin glargine) .... 55 units subcutaneously  qhs 4)    Albuterol 90 Mcg/act  Aers (Albuterol) .... 2 puffs q 4h as needed wheeze, tightness 5)    Cymbalta 30 Mg Cpep (Duloxetine hcl) .... 2 caps by mouth q hs 6)    Vitamin B-12 1000 Mcg Tabs (Cyanocobalamin) .Marland Kitchen.. 1 tab by mouth daily 7)    Fexofenadine Hcl 180 Mg Tabs (Fexofenadine hcl) .Marland Kitchen.. 1 tab by mouth daily 8)    Depakote Er 500 Mg Xr24h-tab (Divalproex sodium) .Marland Kitchen.. 1 by mouth at bedtime 9)    Glucometer Test Strips  .... Test three times a day 10)    Ferrous Sulfate 325 (65 Fe) Mg Tbec (Ferrous sulfate) .Marland Kitchen.. 1 tab by mouth two times a day 11)    Lovaza 1 Gm Caps (Omega-3-acid ethyl esters) .... 4 caps by mouth daily 12)    Actos 45 Mg Tabs (Pioglitazone hcl) .Marland Kitchen.. 1 tab by mouth daily 13)    Doxycycline Hyclate 100 Mg Caps (Doxycycline hyclate) .... Take 1 tablet by mouth two times a day for another 3 weeks 14)    Norvasc 5 Mg Tabs (Amlodipine besylate) .Marland Kitchen.. 1 tab by mouth daily 15)    Novolog Flexpen 100 Unit/ml Soln (Insulin aspart) .... As directed per sliding scale  If you have any questions, please call. We appreciate being able to work with you.   Sincerely,  [facility] Caleb Manson MD

## 2010-12-28 NOTE — Letter (Signed)
Summary: MAILED REQUESTED RECORDS TO THE [facility]  MAILED REQUESTED RECORDS TO THE [facility]   Imported By: Arta Bruce 12/09/2009 15:24:23  _____________________________________________________________________  External Attachment:    Type:   Image     Comment:   External Document

## 2010-12-28 NOTE — Letter (Signed)
Summary: *HSN Results Follow up  [facility]  [address] [place], [place] [address]   Phone: [phone]  Fax: [phone]      12/29/2009   Caleb Joseph [address] [place], [place]  [address]   Dear  Mr. Caleb Joseph,                            ____S.Drinkard,FNP   __x__E. Mulberrby, MD       ____B. McPherson,MD    ____D. Reche Dixon, MD    ____K. Philipp Deputy, MD    ____Other     This letter is to inform you that your recent test(s):  _______Pap Smear    _______Lab Test     _______X-ray    _______ is within acceptable limits  _______ requires a medication change  _______ requires a follow-up lab visit  _______ requires a follow-up visit with your provider   Comments: We have tried to return your call several times.  If we can still be of assistance please give our office a call.  Thank you.       _________________________________________________________ If you have any questions, please contact our office                     Sincerely,  Vesta Mixer CMA [facility]

## 2010-12-28 NOTE — Progress Notes (Signed)
Summary: bp still dropping low  Phone Note Call from Patient   Summary of Call: Pt states his bp is still dropping low when he stands up.  He is not drinking much water.  86/57 standing at 4:05  106/65 sitting then stood up again 83/59.  He is supposed to return to work tomorrow.  His f/u with you is Tuesday 01/17/10 Initial call taken by: Vesta Mixer CMA,  January 12, 2010 5:03 PM  Follow-up for Phone Call        Called pt. and because he identified himself clearly, left a message that if he was unable to drink and bp was still low--needed to go to ED and get IV fluids. If he is just not attempting to drink much, he needs to start. He is not to work if his bp is dropping and he is dizzy--I will write an excuse for work when he follows up with me on Tuesday. Please call Monday and see how he is doing please. Follow-up by: Julieanne Manson MD,  January 13, 2010 6:10 PM  Additional Follow-up for Phone Call Additional follow up Details #1::        Left message on answering machine for pt to return call  Additional Follow-up by: Vesta Mixer CMA,  January 17, 2010 2:34 PM    Additional Follow-up for Phone Call Additional follow up Details #2::    Pt here for f/u, will see Scott. Follow-up by: Vesta Mixer CMA,  January 18, 2010 11:04 AM

## 2010-12-28 NOTE — Letter (Signed)
Summary: [facility]  [facility]   Imported By: Arta Bruce 01/13/2010 12:07:12  _____________________________________________________________________  External Attachment:    Type:   Image     Comment:   External Document

## 2010-12-28 NOTE — Assessment & Plan Note (Signed)
Summary: one month f/u presyncope /tmm   Vital Signs:  Patient profile:   55 year old male Weight:      270 pounds Temp:     97.3 degrees F Pulse rate:   64 / minute Pulse (ortho):   69 / minute Pulse rhythm:   regular Resp:     18 per minute BP sitting:   110 / 62  (left arm) BP standing:   106 / 60 Cuff size:   large  Vitals Entered By: Vesta Mixer CMA (July 04, 2010 12:03 PM) CC: f/u one month presyncope.  Per pt he has not had any spells since here last. Is Patient Diabetic? Yes CBG Result 217  Does patient need assistance? Ambulation Normal   Serial Vital Signs/Assessments:  Time      Position  BP       Pulse  Resp  Temp     By           Standing  106/60   69                    Tiffany McCoy CMA   Primary Care Provider:  Julieanne Manson MD  CC:  f/u one month presyncope.  Per pt he has not had any spells since here last..  History of Present Illness: 1.  Presyncope:  Has not had any further episodes of light headedness.  Tolerating the lower dose of Amlodipine fine.    2.  DM:  Has not filled Lantus since mid June.  Again, long discussion regarding compliance.    3.  Hypertension :  controlled with lower dose of Amlodipine.    4.  Hyperlipidemia:  Close to goal--HDL a bit low and Trigs a bit high.  5.  Foot swelling--on Meloxicam from podiatry/Dr. Stacie Acres.  Discussed concern with kidney function  Current Medications (verified): 1)  Neurontin 600 Mg  Tabs (Gabapentin) .Marland Kitchen.. 1 Capsule By Mouth Two Times A Day 2)  Lipitor 20 Mg  Tabs (Atorvastatin Calcium) .Marland Kitchen.. 1 Tab By Mouth Daily 3)  Lantus Solostar 100 Unit/ml  Soln (Insulin Glargine) .... 55 Units Subcutaneously Qhs 4)  Albuterol 90 Mcg/act  Aers (Albuterol) .... 2 Puffs Q 4h As Needed Wheeze, Tightness 5)  Cymbalta 30 Mg Cpep (Duloxetine Hcl) .... 2 Caps By Mouth Q Hs 6)  Vitamin B-12 1000 Mcg Tabs (Cyanocobalamin) .Marland Kitchen.. 1 Tab By Mouth Daily 7)  Fexofenadine Hcl 180 Mg Tabs (Fexofenadine Hcl) .Marland Kitchen.. 1 Tab  By Mouth Daily 8)  Depakote Er 500 Mg Xr24h-Tab (Divalproex Sodium) .Marland Kitchen.. 1 By Mouth At Bedtime 9)  Glucometer Test Strips .... Test Three Times A Day 10)  Ferrous Sulfate 325 (65 Fe) Mg Tbec (Ferrous Sulfate) .Marland Kitchen.. 1 Tab By Mouth Two Times A Day 11)  Lovaza 1 Gm Caps (Omega-3-Acid Ethyl Esters) .... 4 Caps By Mouth Daily 12)  Actos 45 Mg Tabs (Pioglitazone Hcl) .Marland Kitchen.. 1 Tab By Mouth Daily 13)  Doxycycline Hyclate 100 Mg Caps (Doxycycline Hyclate) .... Take 1 Tablet By Mouth Two Times A Day For Another 3 Weeks 14)  Norvasc 5 Mg Tabs (Amlodipine Besylate) .Marland Kitchen.. 1 Tab By Mouth Daily 15)  Novolog Flexpen 100 Unit/ml Soln (Insulin Aspart) .... As Directed Per Sliding Scale 16)  Meloxicam 15 Mg Tabs (Meloxicam) .Marland Kitchen.. 1 By Mouth Once Daily  Allergies (verified): No Known Drug Allergies  Physical Exam  Lungs:  Normal respiratory effort, chest expands symmetrically. Lungs are clear to auscultation, no crackles or wheezes. Heart:  Normal rate and regular rhythm. S1 and S2 normal without gallop, murmur, click, rub or other extra sounds.  Radial pulses normal and equal Extremities:  compression stockings on.   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 His updated medication list for this problem includes:    Lantus Solostar 100 Unit/ml Soln (Insulin glargine) .Marland KitchenMarland KitchenMarland KitchenMarland Kitchen 55 units subcutaneously qhs    Actos 45 Mg Tabs (Pioglitazone hcl) .Marland Kitchen... 1 tab by mouth daily    Novolog Flexpen 100 Unit/ml Soln (Insulin aspart) .Marland Kitchen... As directed per sliding scale  Problem # 3:  ORTHOSTATIC HYPOTENSION (ICD-458.0) Much improved  Complete Medication List: 1)  Neurontin 600 Mg Tabs (Gabapentin) .Marland Kitchen.. 1 capsule by mouth two times a day 2)   Lipitor 20 Mg Tabs (Atorvastatin calcium) .Marland Kitchen.. 1 tab by mouth daily 3)  Lantus Solostar 100 Unit/ml Soln (Insulin glargine) .... 55 units subcutaneously qhs 4)  Albuterol 90 Mcg/act Aers (Albuterol) .... 2 puffs q 4h as needed wheeze, tightness 5)  Cymbalta 30 Mg Cpep (Duloxetine hcl) .... 2 caps by mouth q hs 6)  Vitamin B-12 1000 Mcg Tabs (Cyanocobalamin) .Marland Kitchen.. 1 tab by mouth daily 7)  Fexofenadine Hcl 180 Mg Tabs (Fexofenadine hcl) .Marland Kitchen.. 1 tab by mouth daily 8)  Depakote Er 500 Mg Xr24h-tab (Divalproex sodium) .Marland Kitchen.. 1 by mouth at bedtime 9)  Glucometer Test Strips  .... Test three times a day 10)  Ferrous Sulfate 325 (65 Fe) Mg Tbec (Ferrous sulfate) .Marland Kitchen.. 1 tab by mouth two times a day 11)  Lovaza 1 Gm Caps (Omega-3-acid ethyl esters) .... 4 caps by mouth daily 12)  Actos 45 Mg Tabs (Pioglitazone hcl) .Marland Kitchen.. 1 tab by mouth daily 13)  Doxycycline Hyclate 100 Mg Caps (Doxycycline hyclate) .... Take 1 tablet by mouth two times a day for another 3 weeks 14)  Norvasc 5 Mg Tabs (Amlodipine besylate) .Marland Kitchen.. 1 tab by mouth daily 15)  Novolog Flexpen 100 Unit/ml Soln (Insulin aspart) .... As directed per sliding scale 16)  Meloxicam 15 Mg Tabs (Meloxicam) .Marland Kitchen.. 1 by mouth once daily  Other Orders: Capillary Blood Glucose/CBG (46962) T-Basic Metabolic Panel ([id number])  Patient Instructions: 1)  Follow up with Dr. Delrae Alfred in 4 months

## 2010-12-28 NOTE — Letter (Signed)
Summary: Out of Work  [facility]  [address] [place], [place] [address]   Phone: [phone]  Fax: [phone]    January 10, 2010   Employee:  Caleb Joseph    To Whom It May Concern:   For Medical reasons, please excuse the above named employee from work for the following dates:  Start:   01/09/2010  End:   01/12/2010  If you need additional information, please feel free to contact our office.         Sincerely,    Tereso Newcomer PA-C

## 2010-12-28 NOTE — Assessment & Plan Note (Signed)
Summary: BP CHECK///KT  Nurse Visit   Vital Signs:  Patient profile:   55 year old male Pulse rate:   76 / minute Pulse rhythm:   regular BP sitting:   110 / 67  (left arm) BP standing:   113 / 75  (left arm) Cuff size:   large  Vitals Entered By: Dutch Quint RN (May 25, 2010 9:33 AM)   Patient Instructions: 1)  Follow-up with Dr. Delrae Alfred on 06/02/10. 2)  With low blood pressure, make sure you change positions carefully, don't stand up rapidly.   Primary Care Provider:  Julieanne Manson MD  CC:  Wants BP and CBG checked.  History of Present Illness: Discussed with Jesse Fall, FNP -- will draw Hgb a1c to have ready for next visit with Dr. Delrae Alfred.   CC: Wants BP and CBG checked Is Patient Diabetic? Yes Did you bring your meter with you today? No Pain Assessment Patient in pain? no      CBG Result 210  Does patient need assistance? Functional Status Self care Ambulation Normal Comments Has been NPO since midnight, states CBG is "good" for him.  States that repetitive movement of his right hand causes pain in his wrist and lateral side of hand. Uses a vacuum wand repeatedly and that causes the pain   Still having dizzy spells, no headache, "normal" ankle swelling, wears support socks.  Denies visual changes.  Will f/u with Dr.Efstathios Sawin 06/02/10.   Allergies: No Known Drug Allergies Laboratory Results   Blood Tests     CBG Random:: 210mg /dL     Orders Added: 1)  Capillary Blood Glucose/CBG [82948] 2)  Est. Patient Level I [99211] 3)  T- Hemoglobin A1C [83036-23375]

## 2010-12-28 NOTE — Progress Notes (Signed)
  Phone Note Outgoing Call   Call placed by: Julieanne Manson MD,  June 02, 2010 2:42 PM Summary of Call: Stanton Kidney:  pt. would like to be seen for right ulnar neuropathy by Dr. Channing Mutters, Neurosurgery.  He states he is willing to pay whatever upfront costs necessary.  He will likely need a nerve conduction study.  See OV and letter. Initial call taken by: Julieanne Manson MD,  June 02, 2010 2:43 PM

## 2010-12-28 NOTE — Progress Notes (Signed)
  Phone Note Call from Patient   Summary of Call: Left message on answering machine for pt to call back...Marland KitchenMarland KitchenMarland Kitchen trying to see if pt went to neurosurgery appt on 08-23-10 @ 9 Initial call taken by: [place] Shannon,  October 23, 2010 11:46 AM  Follow-up for Phone Call        Left message on answering machine for pt to call back...Marland KitchenMarland KitchenArmenia Shannon  October 24, 2010 12:59 PM   Left message on answering machine for pt to call back...Marland KitchenMarland KitchenMarland Kitchen will mail letter.Marland KitchenMarland KitchenArmenia Shannon  October 25, 2010 2:26 PM

## 2010-12-28 NOTE — Progress Notes (Signed)
  Phone Note Outgoing Call   Summary of Call: Please see OV note from today. Do you think we should send him to cardiology for poss tilt table testing?  He had neg myovue in 06/2009. Initial call taken by: Brynda Rim,  January 18, 2010 2:48 PM  Follow-up for Phone Call        Called pt--suspect his hypotension is more related to poor DM control and dehydration.  Will have him come in for orthostatics this afternoon. he was to follow up with me, but I was out the day of his OV. Follow-up by: Julieanne Manson MD,  January 20, 2010 2:54 PM

## 2010-12-28 NOTE — Letter (Signed)
Summary: PT BROUGHT IN B/P READING  PT BROUGHT IN B/P READING   Imported By: Arta Bruce 03/10/2010 14:31:38  _____________________________________________________________________  External Attachment:    Type:   Image     Comment:   External Document

## 2010-12-28 NOTE — Assessment & Plan Note (Signed)
Summary: 1 WEEK FU///KT   Vital Signs:  Patient profile:   55 year old male Height:      75 inches Weight:      259 pounds BMI:     32.49 Temp:     97.7 degrees F oral Pulse rate:   93 / minute Pulse rhythm:   regular Resp:     20 per minute BP sitting:   127 / 79  (left arm) BP standing:   124 / 85  (left arm) Cuff size:   large  Vitals Entered By: [place] Shannon (January 18, 2010 10:55 AM) CC: pt says he is not feeling any better. pt says he still feel it in his chest.. pt says he has a head cold now.... pt says he has a little blood in his nose... Is Patient Diabetic? Yes Pain Assessment Patient in pain? no      CBG Result 263  Does patient need assistance? Functional Status Self care Ambulation Normal   Primary Care Provider:  Julieanne Manson MD  CC:  pt says he is not feeling any better. pt says he still feel it in his chest.. pt says he has a head cold now.... pt says he has a little blood in his nose....  History of Present Illness: 55 year old male presents for followup.  I saw him last week.  He was to follow up with Dr. Delrae Alfred, however, she is not here today.  He does describe another episode of "passing out."  This is not entirely clear.  He remembers hitting the floor.  However, he states everything goes black.  He feels lightheaded when he stands up.  He denies assoc chest pain or dyspnea, arm or jaw pain or diaph.  He denies loss of bowel or bladder control.  Denies tongue biting.  Denies confusion.    He brings in another meticulous account of his blood pressures.  He does have some readings that dropped into the 80s systolically.  He denies associated chest pain or shortness of breath.  He hit his chest on a box several months ago.  That spot is still sore.  He denies any shortness of breath.    He has been coughing somewhat.  He denies fevers.  His sputum is clear to white.  He denies hemoptysis.    He drinks a pot of coffee a day at times.  He does  not like to drink water.  When he called last week, he had not been drinking any water.  He started to drink more water after his episode.  He has felt better since that time.    He also brings in a listing of his blood sugars.  They have essentially been all over the place.  When he does take his Lantus, his sugar in the morning is 106.  There are times when he misses his Lantus and by the evening his sugar is over 400.  He states he is compliant with his medication.   He has not been taking the metoprolol or the Norvasc.  Since I last saw him I do not see any pressures that are above goal.    Current Medications (verified): 1)  Neurontin 600 Mg  Tabs (Gabapentin) .Marland Kitchen.. 1 Capsule By Mouth At Night 2)  Lipitor 20 Mg  Tabs (Atorvastatin Calcium) .Marland Kitchen.. 1 Tab By Mouth Daily 3)  Lantus Solostar 100 Unit/ml  Soln (Insulin Glargine) .... 55 Units Subcutaneously Qhs 4)  Albuterol 90 Mcg/act  Aers (Albuterol) .... 2  Puffs Q 4h As Needed Wheeze, Tightness 5)  Cymbalta 30 Mg Cpep (Duloxetine Hcl) .... 2 Caps By Mouth Q Hs 6)  Vitamin B-12 1000 Mcg Tabs (Cyanocobalamin) .Marland Kitchen.. 1 Tab By Mouth Daily 7)  Fexofenadine Hcl 180 Mg Tabs (Fexofenadine Hcl) .Marland Kitchen.. 1 Tab By Mouth Daily 8)  Depakote Er 500 Mg Xr24h-Tab (Divalproex Sodium) .Marland Kitchen.. 1 By Mouth At Bedtime 9)  Glucometer Test Strips .... Test Three Times A Day 10)  Ferrous Sulfate 325 (65 Fe) Mg Tbec (Ferrous Sulfate) .Marland Kitchen.. 1 Tab By Mouth Two Times A Day 11)  Lovaza 1 Gm Caps (Omega-3-Acid Ethyl Esters) .... 4 Caps By Mouth Daily 12)  Actos 45 Mg Tabs (Pioglitazone Hcl) .Marland Kitchen.. 1 Tab By Mouth Daily 13)  Doxycycline Hyclate 100 Mg Caps (Doxycycline Hyclate) .... Take 1 Tablet By Mouth Two Times A Day For Another 3 Weeks 14)  Norvasc 10 Mg Tabs (Amlodipine Besylate) .... Take 1 Tablet By Mouth Once A Day 15)  Metoprolol Tartrate 25 Mg Tabs (Metoprolol Tartrate) .... Take 1 Tablet By Mouth Two Times A Day  Allergies (verified): No Known Drug Allergies  Physical  Exam  General:  alert, well-developed, and well-nourished.   Head:  normocephalic and atraumatic.   Ears:  R ear normal and L ear normal.   Nose:  no external deformity.   Mouth:  pharynx pink and moist.   Neck:  supple and no cervical lymphadenopathy.   Lungs:  normal breath sounds, no crackles, and no wheezes.   Heart:  normal rate and regular rhythm.   Abdomen:  soft and non-tender.   Msk:  normal ROM.   Extremities:  no edema Neurologic:  alert & oriented X3 and cranial nerves II-XII intact.   Skin:  turgor normal Psych:  normally interactive.     Impression & Recommendations:  Problem # 1:  ORTHOSTATIC HYPOTENSION (ICD-458.0) I believe he is dehydrated from excessive caffeine intake and largely uncontrolled blood sugars He probably also has autonomic dysfunction with his diabetes. He has not been wearing compression hose with his foot ulcer. HR from lying to standing (66 . . . 77. . . 81); so he does not go up significantly and his bp does not drop today rec he work on controlling his sugars wear compression stockings once daily decrease caffeine inake rec he stay out of work this week and call on Fri . . . if better can give note to return at that time.  Problem # 2:  SYNCOPE (ICD-780.2) not so sure he is having true syncope he remembers hitting the floor could consider sending back to card for tilt table or heart monitor will d/w Dr. Delrae Alfred  Problem # 3:  DIABETES MELLITUS, TYPE II, ON INSULIN, UNCONTROLLED (ICD-250.72) only went up to 60 units on Lantus never adjusted it further like I told him discussed adjusting his Lantus and starting on sliding scale will start novolog and have gone over sliding scale with him  His updated medication list for this problem includes:    Lantus Solostar 100 Unit/ml Soln (Insulin glargine) .Marland KitchenMarland KitchenMarland KitchenMarland Kitchen 55 units subcutaneously qhs    Actos 45 Mg Tabs (Pioglitazone hcl) .Marland Kitchen... 1 tab by mouth daily    Novolog Flexpen 100 Unit/ml Soln  (Insulin aspart) .Marland Kitchen... As directed per sliding scale  Complete Medication List: 1)  Neurontin 600 Mg Tabs (Gabapentin) .Marland Kitchen.. 1 capsule by mouth at night 2)  Lipitor 20 Mg Tabs (Atorvastatin calcium) .Marland Kitchen.. 1 tab by mouth daily 3)  Lantus Solostar 100  Unit/ml Soln (Insulin glargine) .... 55 units subcutaneously qhs 4)  Albuterol 90 Mcg/act Aers (Albuterol) .... 2 puffs q 4h as needed wheeze, tightness 5)  Cymbalta 30 Mg Cpep (Duloxetine hcl) .... 2 caps by mouth q hs 6)  Vitamin B-12 1000 Mcg Tabs (Cyanocobalamin) .Marland Kitchen.. 1 tab by mouth daily 7)  Fexofenadine Hcl 180 Mg Tabs (Fexofenadine hcl) .Marland Kitchen.. 1 tab by mouth daily 8)  Depakote Er 500 Mg Xr24h-tab (Divalproex sodium) .Marland Kitchen.. 1 by mouth at bedtime 9)  Glucometer Test Strips  .... Test three times a day 10)  Ferrous Sulfate 325 (65 Fe) Mg Tbec (Ferrous sulfate) .Marland Kitchen.. 1 tab by mouth two times a day 11)  Lovaza 1 Gm Caps (Omega-3-acid ethyl esters) .... 4 caps by mouth daily 12)  Actos 45 Mg Tabs (Pioglitazone hcl) .Marland Kitchen.. 1 tab by mouth daily 13)  Doxycycline Hyclate 100 Mg Caps (Doxycycline hyclate) .... Take 1 tablet by mouth two times a day for another 3 weeks 14)  Norvasc 10 Mg Tabs (Amlodipine besylate) .... Take 1 tablet by mouth once a day 15)  Metoprolol Tartrate 25 Mg Tabs (Metoprolol tartrate) .... Take 1 tablet by mouth two times a day 16)  Novolog Flexpen 100 Unit/ml Soln (Insulin aspart) .... As directed per sliding scale  Patient Instructions: 1)  Take Lantus 60 units each night. 2)  Check your sugar before each meal. 3)  Take Novolog Insulin with the following sliding scale: 4)  Less than 150 . . .take nothing. 5)  151-200. . . .Take 2 units. 6)  201-250. . . . Take 4 units. 7)  251-300. . .. Take 6 units. 8)  301-350. Marland Kitchen Marland Kitchen.Take 8 units. 9)  351-400. . . Take 10 units. 10)  Over 400. .. Take 12 units and call. 11)  Do not take novolog if you are not going to eat. 12)  Call on Friday and talk to Tiffany about your sugars, blood  pressures, how you are feeling.  If ok, one of [place] will get you a note to return to work on Monday. 13)  Please schedule a follow-up appointment in 2 weeks with Dr. Delrae Alfred.  14)  Tylenol for aches and pains.  Saline nose spray as needed.  Robitussin as needed for cough.  Call if you have a fever of 101 or higher or go to the emergency room. Prescriptions: NOVOLOG FLEXPEN 100 UNIT/ML SOLN (INSULIN ASPART) as directed per sliding scale  #1 mo supply x 11   Entered and Authorized by:   Tereso Newcomer PA-C   Signed by:   Tereso Newcomer PA-C on 01/18/2010   Method used:   Print then Give to Patient   RxID:   [id number]    Echocardiogram  Procedure date:  09/20/2009  Findings:      Study Conclusions    1. Left ventricle: The cavity size was normal. There was mild      concentric hypertrophy. Systolic function was low normal. The      estimated ejection fraction was in the range of 50% to 55%. Wall      motion was normal; there were no regional wall motion      abnormalities. Left ventricular diastolic function parameters      were normal.   2. Aortic valve: Trivial regurgitation.   3. Mitral valve: Mild regurgitation.   4. Left atrium: The atrium was mildly dilated.   5. Right ventricle: Systolic function was normal.   6. Atrial septum: A patent foramen ovale  cannot be excluded.   7. Tricuspid valve: Trivial regurgitation.   8. Pulmonary arteries: PA peak pressure: 42mm Hg consistent with      mild pulmonary HTN.   Transthoracic echocardiography. M-mode, complete 2D, spectral   Doppler, and color Doppler. Height: Height: 185.4cm. Height: 73in.   Weight: Weight: 102kg. Weight: 224.4lb. Body mass index: BMI:   29.7kg/m^2. Body surface area: BSA: 2.31m^2. Patient status:   Inpatient. Location: Bedside.  Nuclear ETT  Procedure date:  07/28/2009  Findings:      Overall Impression   Exercise Capacity: Adenosine with low level exercise. BP Response: Normal blood pressure  response. Clinical Symptoms: chest pressure ECG Impression: No significant ST segment change suggestive of ischemia. Overall Impression Comments: There are no diagnostic abnormalities. There is no definite ischemia. Wall motion is low normal.

## 2010-12-28 NOTE — Miscellaneous (Signed)
Summary: [facility]  [facility]   Imported By: Florinda Marker 11/29/2009 15:23:11  _____________________________________________________________________  External Attachment:    Type:   Image     Comment:   External Document

## 2010-12-28 NOTE — Progress Notes (Signed)
Summary: neurosurgeon referral   Phone Note Outgoing Call   Summary of Call: I just want to let you know that I'm trying to get in contact with Caleb Joseph about his appt [name] 08-23-10 @ 9am since 8-11,8-15,8-17 ,8-18 and 8-19 i left several of messages and no return . Initial call taken by: Cheryll Dessert,  July 17, 2010 11:27 AM  Follow-up for Phone Call        I send him a letter to contact me  Follow-up by: Cheryll Dessert,  July 21, 2010 8:39 AM

## 2010-12-28 NOTE — Progress Notes (Signed)
Summary: BP BOTTOMING OUT  Phone Note Call from Patient Call back at [facility] Phone [phone]   Summary of Call: Angelika Jerrett PT. MR BUTRRY SAYS THAT HIS BLOOD PRESSURE IS STARTING TO BOTTOM OUT. AT 9:44 LAST NIGHT IT WAS 95/58. THIS MORNING AT  5 IT WAS 141/84 AND AT 8:21 IT  WAS 124/77 AND HE IS STARTING TO HAVE THE FAINTING SPELLS LIKE HE DID BEFORE. HSE WOULD LIKE FOR SOMEONE TO CALL HIM. MR Lagrow SAYS THE LAST TIME THIS HAPPENED, HE ENDED UP IN THE HOSP. BUT HE DOESN'T WANT TO GO BACK AGAIN. Initial call taken by: Leodis Rains,  December 19, 2009 9:36 AM  Follow-up for Phone Call        Left message on answering machine for pt to return call at [phone]. Follow-up by: Vesta Mixer CMA,  December 19, 2009 4:34 PM  Additional Follow-up for Phone Call Additional follow up Details #1::        left message to return call...Marland KitchenMarland KitchenMikey College CMA  December 22, 2009 12:51 PM     Additional Follow-up for Phone Call Additional follow up Details #2::    Left message on answering machine for pt to return call at [phone]. Follow-up by: Vesta Mixer CMA,  December 26, 2009 8:51 AM  Additional Follow-up for Phone Call Additional follow up Details #3:: Details for Additional Follow-up Action Taken: Left message on answering machine for pt to return call at same number also will mail letter to pt to call ............ Tiffany McCoy CMA  December 29, 2009 11:01 AM  Pt called back and said at the time he called he was just getting over being sick and his bp was dropping some.  He says it is leveling out now, this morning his bp was 133/84 standing and 119/76 sitting. His next appt with you is 02/21/10.......... Elmarie Shiley McCoy CMA  December 29, 2009 11:22 AM

## 2010-12-28 NOTE — Letter (Signed)
Summary: REQUESTING RECORDSFROM DR.SUPPLE  REQUESTING RECORDSFROM DR.SUPPLE   Imported By: Arta Bruce 01/04/2010 15:02:06  _____________________________________________________________________  External Attachment:    Type:   Image     Comment:   External Document

## 2010-12-28 NOTE — Progress Notes (Signed)
Summary: dizzy/low bp  Phone Note Call from Patient   Summary of Call: Pt called stating he is dizzy and bp drops low when he stand up again.  Scheduled appt for tomorrow at 10am with Dr. Delrae Alfred.  Did encourgae pt to make sure drinking plenty of water.  Ok to leave msg on voice mail.  From 120's/90's to 90's/60's Initial call taken by: Vesta Mixer CMA,  January 09, 2010 11:51 AM  Follow-up for Phone Call        Call and see if he is taking any bp meds--if so, he should hold until I see him tomorrow.   Make sure he isn't having fevers--if he is--he needs to be seen in ED or if an opening at our office today. Follow-up by: Julieanne Manson MD,  January 09, 2010 2:07 PM  Additional Follow-up for Phone Call Additional follow up Details #1::        left detailed msg on pt's voice mail. Additional Follow-up by: Vesta Mixer CMA,  January 09, 2010 4:17 PM

## 2010-12-28 NOTE — Letter (Signed)
Summary: [name]//NO SHOWED  [name]NO SHOWED   Imported By: Arta Bruce 11/13/2010 15:28:05  _____________________________________________________________________  External Attachment:    Type:   Image     Comment:   External Document

## 2010-12-28 NOTE — Letter (Signed)
Summary: *Referral Letter  [facility]  [address] [place], [place] [address]   Phone: [phone]  Fax: [phone]    06/02/2010  Thank you in advance for agreeing to see my patient:  Caleb Joseph [address] [place], [place]  [address]  Phone: [phone]  Reason for Referral: Ulnar neuropathy of right hand for about 4 months.  Suspect compressive--pt. relates pressure at his ulnar wrist causes Tinel - like response of involved fingers.  No symptoms over ulnar nerve at medial elbow.  Numbness is now constant.  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) 12)  CHRONIC OSTEOMYELITIS RIGHT  FOOT (ICD-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) 13)  DYSPNEA (ICD-786.05) 14)  PVD (ICD-443.9) 15)  PERIPHERAL EDEMA (ICD-782.3) 16)  HYPERLIPIDEMIA (ICD-272.4) 17)  HYPERTENSION (ICD-401.9) 18)  ACUTE KIDNEY FAILURE UNSPECIFIED (ICD-584.9) 19)  HYPOTENSION (ICD-458.9) 20)  HEALTH MAINTENANCE EXAM (ICD-V70.0) 21)  CHRONIC RIGHT FOOT ULCER (ICD-707.10) 22)  NONCOMPLIANCE (ICD-V15.81) 23)  HYPERKALEMIA (ICD-276.7) 24)  PRESYNCOPE (ICD-780.2) 25)  ABSCESS, PROSTATE GLAND (ICD-601.2) 26)  VIRAL INFECTION (ICD-079.99) 27)  DEHYDRATION (ICD-276.51) 28)  URINARY TRACT INFECTION (ICD-599.0) 29)  DIABETIC MACULAR EDEMA (ICD-362.07) 30)  ANEMIA, VITAMIN B12 DEFICIENCY (ICD-281.1) 31)  DENTAL CARIES  (ICD-521.00) 32)  HYPOKALEMIA (ICD-276.8) 33)  ANEMIA (ICD-285.9) 34)  WEIGHT LOSS, ABNORMAL (ICD-783.21) 35)  DEPRESSION (ICD-311) 36)  BRONCHITIS, ACUTE WITH BRONCHOSPASM (ICD-466.0) 37)  ENCOUNTER FOR LONG-TERM USE OF OTHER MEDICATIONS (ICD-V58.69) 38)  VIRAL INFECTION (ICD-079.99) 39)  FREQUENCY, URINARY (ICD-788.41) 40)  INGUINAL PAIN, RIGHT (ICD-789.09) 41)  OLIGOSPERMIA (ICD-606.1) 42)  DIABETIC PERIPHERAL NEUROPATHY (ICD-250.60) 43)  DIABETES MELLITUS, TYPE II, ON INSULIN, UNCONTROLLED (ICD-250.72)   Current Medications: 1)  NEURONTIN 600 MG  TABS (GABAPENTIN) 1 capsule by mouth two times a day 2)  LIPITOR 20 MG  TABS (ATORVASTATIN CALCIUM) 1 tab by mouth daily 3)  LANTUS SOLOSTAR 100 UNIT/ML  SOLN (INSULIN GLARGINE) 55 units subcutaneously qhs 4)  ALBUTEROL 90 MCG/ACT  AERS (ALBUTEROL) 2 puffs q 4h as needed wheeze, tightness 5)  CYMBALTA 30 MG CPEP (DULOXETINE HCL) 2 caps by mouth q hs 6)  VITAMIN B-12 1000 MCG TABS (CYANOCOBALAMIN) 1 tab by mouth daily 7)  FEXOFENADINE HCL 180 MG TABS (FEXOFENADINE HCL) 1 tab by mouth daily 8)  DEPAKOTE ER 500 MG XR24H-TAB (DIVALPROEX SODIUM) 1 by mouth at bedtime 9)  * GLUCOMETER TEST STRIPS test three times a day 10)  FERROUS SULFATE 325 (65 FE) MG TBEC (FERROUS SULFATE) 1 tab by mouth two times a day 11)  LOVAZA 1 GM CAPS (OMEGA-3-ACID ETHYL ESTERS) 4 caps by mouth daily 12)  ACTOS 45 MG TABS (PIOGLITAZONE HCL) 1 tab by mouth daily 13)  DOXYCYCLINE HYCLATE 100 MG CAPS (DOXYCYCLINE HYCLATE) Take 1 tablet by mouth two times a day for another 3 weeks 14)  NORVASC 5 MG TABS (AMLODIPINE BESYLATE) 1 tab by mouth daily 15)  NOVOLOG FLEXPEN 100 UNIT/ML SOLN (INSULIN ASPART) as directed per sliding scale   Past Medical History: 1)  Current Problems:  2)  PERIPHERAL EDEMA (ICD-782.3) 3)  HYPERLIPIDEMIA (ICD-272.4) 4)  HYPERTENSION (ICD-401.9) 5)  ACUTE KIDNEY FAILURE UNSPECIFIED (ICD-584.9) 6)  HYPOTENSION (ICD-458.9) 7)  HEALTH  MAINTENANCE EXAM (ICD-V70.0) 8)  CHRONIC RIGHT FOOT ULCER (ICD-707.10) 9)  NONCOMPLIANCE (ICD-V15.81) 10)  HYPERKALEMIA (ICD-276.7) 11)  PRESYNCOPE (ICD-780.2) 12)  ABSCESS, PROSTATE GLAND (ICD-601.2) 13)  VIRAL INFECTION (ICD-079.99) 14)  DEHYDRATION (ICD-276.51) 15)  URINARY TRACT INFECTION (ICD-599.0) 16)  DIABETIC MACULAR EDEMA (ICD-362.07) 17)  ANEMIA, VITAMIN B12 DEFICIENCY (ICD-281.1) 18)  DENTAL CARIES (ICD-521.00) 19)  HYPOKALEMIA (ICD-276.8) 20)  ANEMIA (ICD-285.9) 21)  WEIGHT LOSS, ABNORMAL (ICD-783.21) 22)  DEPRESSION (ICD-311) 23)  BRONCHITIS, ACUTE WITH BRONCHOSPASM (ICD-466.0) 24)  ENCOUNTER FOR LONG-TERM USE OF OTHER MEDICATIONS (ICD-V58.69) 25)  VIRAL INFECTION (ICD-079.99) 26)  FREQUENCY, URINARY (ICD-788.41) 27)  INGUINAL PAIN, RIGHT (ICD-789.09) 28)  OLIGOSPERMIA (ICD-606.1) 29)  DIABETIC PERIPHERAL NEUROPATHY (ICD-250.60) 30)  DIABETES MELLITUS, TYPE II, ON INSULIN, UNCONTROLLED (ICD-250.72) 31)  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  Sincerely,  Julieanne Manson MD

## 2010-12-28 NOTE — Letter (Signed)
Summary: Generic Letter  [facility]  [address] [place], [place] [address]   Phone: [phone]  Fax: [phone]        12/12/2010  [facility] [address] [place], [place]  [address]  Dear Mr. Dinan,  We have been unable to contact you by telephone.  Please call our office, at your earliest convenience, so that we may speak with you.   Sincerely,   Dutch Quint RN

## 2010-12-29 NOTE — Letter (Signed)
Summary: MAILED REQUESTED LABS TO [facility]  MAILED REQUESTED LABS TO [facility]   Imported By: Arta Bruce 11/06/2010 10:33:45  _____________________________________________________________________  External Attachment:    Type:   Image     Comment:   External Document

## 2011-01-11 ENCOUNTER — Encounter: Payer: Self-pay | Admitting: Internal Medicine

## 2011-01-11 ENCOUNTER — Encounter (INDEPENDENT_AMBULATORY_CARE_PROVIDER_SITE_OTHER): Payer: Self-pay | Admitting: Internal Medicine

## 2011-01-11 DIAGNOSIS — R5383 Other fatigue: Secondary | ICD-10-CM | POA: Insufficient documentation

## 2011-01-11 DIAGNOSIS — R5381 Other malaise: Secondary | ICD-10-CM | POA: Insufficient documentation

## 2011-01-11 LAB — CONVERTED CEMR LAB
ALT: 15 units/L (ref 0–53)
Alkaline Phosphatase: 66 units/L (ref 39–117)
Basophils Absolute: 0.1 10*3/uL (ref 0.0–0.1)
Blood Glucose, Fingerstick: 338
Eosinophils Absolute: 0.1 10*3/uL (ref 0.0–0.7)
Eosinophils Relative: 2 % (ref 0–5)
HCT: 37.1 % — ABNORMAL LOW (ref 39.0–52.0)
LDL Cholesterol: 68 mg/dL (ref 0–99)
Lymphocytes Relative: 31 % (ref 12–46)
MCV: 91.8 fL (ref 78.0–100.0)
Microalb, Ur: 2.71 mg/dL — ABNORMAL HIGH (ref 0.00–1.89)
Platelets: 126 10*3/uL — ABNORMAL LOW (ref 150–400)
RDW: 13.7 % (ref 11.5–15.5)
Sodium: 135 meq/L (ref 135–145)
TSH: 1.089 microintl units/mL (ref 0.350–4.500)
Total Bilirubin: 0.9 mg/dL (ref 0.3–1.2)
Total Protein: 7.1 g/dL (ref 6.0–8.3)
VLDL: 32 mg/dL (ref 0–40)

## 2011-01-17 NOTE — Assessment & Plan Note (Addendum)
Summary: OV   Vital Signs:  Patient profile:   55 year old male Weight:      240.9 pounds BSA:     2.38 Temp:     97.6 degrees F oral Pulse rate:   88 / minute Pulse rhythm:   regular Resp:     20 per minute BP sitting:   130 / 80  (left arm) Cuff size:   regular  Vitals Entered By: Gaylyn Cheers RN (January 11, 2011 10:21 AM) CC: Tenderness rt. chest intermitant for past 3 mos. Hx of a fall hitting chest area. F/U DM Is Patient Diabetic? Yes Did you bring your meter with you today? No Pain Assessment Patient in pain? no      CBG Result 338 CBG Device ID A  Does patient need assistance? Functional Status Self care Ambulation Normal   Primary Care Provider:  Julieanne Manson MD  CC:  Tenderness rt. chest intermitant for past 3 mos. Hx of a fall hitting chest area. F/U DM.  History of Present Illness: 55 yo male here for follow up --has not been here for some time.  1.  DM:  Still not taking insulin regularly.  Drinking a lot of water, but no polyuria.  Has not had eyes checked this year.  Has not had flu vaccine this year.  2.  Weight loss and fatigue.  Has been trying to lose weight purposefully.  Has also had difficulty getting enough food.  Refuses to go to food pantry.  Feels fatigued on and off.  Feels it more so when not physically active and not at work.  Not sleeping well at times.  3.  Hypertension:  appears to be missing meds, though bp okay today--has a number of pills in each bottle that should have been finished a few days ago.  Pt. gives many reasons why he believes he still has pills.  Current Medications (verified): 1)  Neurontin 600 Mg  Tabs (Gabapentin) .Marland Kitchen.. 1 Capsule By Mouth Two Times A Day 2)  Lipitor 20 Mg  Tabs (Atorvastatin Calcium) .Marland Kitchen.. 1 Tab By Mouth Daily 3)  Lantus Solostar 100 Unit/ml  Soln (Insulin Glargine) .... 60 Units Subcutaneously Qhs 4)  Albuterol 90 Mcg/act  Aers (Albuterol) .... 2 Puffs Q 4h As Needed Wheeze, Tightness 5)   Cymbalta 30 Mg Cpep (Duloxetine Hcl) .... 2 Caps By Mouth Q Hs 6)  Vitamin B-12 1000 Mcg Tabs (Cyanocobalamin) .Marland Kitchen.. 1 Tab By Mouth Daily 7)  Xyzal 5 Mg Tabs (Levocetirizine Dihydrochloride) .Marland Kitchen.. 1 Tab By Mouth Daily For Allergies 8)  Depakote Er 500 Mg Xr24h-Tab (Divalproex Sodium) .Marland Kitchen.. 1 By Mouth At Bedtime 9)  Glucometer Test Strips .... Test Three Times A Day 10)  Ferrous Sulfate 325 (65 Fe) Mg Tbec (Ferrous Sulfate) .Marland Kitchen.. 1 Tab By Mouth Two Times A Day 11)  Lovaza 1 Gm Caps (Omega-3-Acid Ethyl Esters) .... 4 Caps By Mouth Daily 12)  Actos 45 Mg Tabs (Pioglitazone Hcl) .Marland Kitchen.. 1 Tab By Mouth Daily 13)  Norvasc 5 Mg Tabs (Amlodipine Besylate) .Marland Kitchen.. 1 Tab By Mouth Daily 14)  Novolog Flexpen 100 Unit/ml Soln (Insulin Aspart) .... As Directed Per Sliding Scale  Allergies (verified): No Known Drug Allergies  Physical Exam  General:  NAD, looks well for pt. Lungs:  Normal respiratory effort, chest expands symmetrically. Lungs are clear to auscultation, no crackles or wheezes. Heart:  Normal rate and regular rhythm. S1 and S2 normal without gallop, murmur, click, rub or other extra sounds.  Radial  pulses normal and equal Abdomen:  Bowel sounds positive,abdomen soft and non-tender without masses, organomegaly or hernias noted. Extremities:  No active ulcers on feet, but does have callousing--chronic.  Diabetes Management Exam:    Foot Exam (with socks and/or shoes not present):       Sensory-Monofilament:          Left foot: abnormal          Right foot: abnormal   Impression & Recommendations:  Problem # 1:  FATIGUE (ICD-780.79)  Orders: T-Comprehensive Metabolic Panel ([id number]) T-CBC w/Diff (46962-95284) T-TSH (13244-01027)  Problem # 2:  DIABETES MELLITUS, TYPE II, WITH HYPEROSMOLARITY (ICD-250.20) Suspect will not be at goal Encouraged pt. to be compliant with meds Rarely uses Novolog when sugars really high His updated medication list for this problem includes:    Lantus  Solostar 100 Unit/ml Soln (Insulin glargine) .Marland KitchenMarland KitchenMarland KitchenMarland Kitchen 60 units subcutaneously qhs    Actos 45 Mg Tabs (Pioglitazone hcl) .Marland Kitchen... 1 tab by mouth daily    Novolog Flexpen 100 Unit/ml Soln (Insulin aspart) .Marland Kitchen... As directed per sliding scale  Orders: T-Comprehensive Metabolic Panel 240 100 7630) T-Lipid Profile [id number]) T-CBC w/Diff [phone]) T- Hemoglobin A1C (84166-06301) T-Urine Microalbumin w/creat. ratio [phone]) UA Dipstick w/o Micro (automated)  (81003)  Problem # 3:  HYPERTENSION (ICD-401.9) BP at goal His updated medication list for this problem includes:    Norvasc 5 Mg Tabs (Amlodipine besylate) .Marland Kitchen... 1 tab by mouth daily  Complete Medication List: 1)  Neurontin 600 Mg Tabs (Gabapentin) .... 2 tabs by mouth at bedtime--pt. preference. 2)  Lipitor 20 Mg Tabs (Atorvastatin calcium) .Marland Kitchen.. 1 tab by mouth daily 3)  Lantus Solostar 100 Unit/ml Soln (Insulin glargine) .... 60 units subcutaneously qhs 4)  Albuterol 90 Mcg/act Aers (Albuterol) .... 2 puffs q 4h as needed wheeze, tightness 5)  Cymbalta 30 Mg Cpep (Duloxetine hcl) .... 2 caps by mouth q hs 6)  Vitamin B-12 1000 Mcg Tabs (Cyanocobalamin) .Marland Kitchen.. 1 tab by mouth daily 7)  Xyzal 5 Mg Tabs (Levocetirizine dihydrochloride) .Marland Kitchen.. 1 tab by mouth daily for allergies 8)  Depakote Er 500 Mg Xr24h-tab (Divalproex sodium) .Marland Kitchen.. 1 by mouth at bedtime 9)  Glucometer Test Strips  .... Test three times a day 10)  Ferrous Sulfate 325 (65 Fe) Mg Tbec (Ferrous sulfate) .Marland Kitchen.. 1 tab by mouth two times a day 11)  Lovaza 1 Gm Caps (Omega-3-acid ethyl esters) .... 4 caps by mouth daily 12)  Actos 45 Mg Tabs (Pioglitazone hcl) .Marland Kitchen.. 1 tab by mouth daily 13)  Norvasc 5 Mg Tabs (Amlodipine besylate) .Marland Kitchen.. 1 tab by mouth daily 14)  Novolog Flexpen 100 Unit/ml Soln (Insulin aspart) .... As directed per sliding scale  Other Orders: Capillary Blood Glucose/CBG (02542)  Patient Instructions: 1)  Retasure--schedule please 2)  Weight check in 1  month 3)  Follow up with Dr. Delrae Alfred in 4 months  Prescriptions: FERROUS SULFATE 325 (65 FE) MG TBEC (FERROUS SULFATE) 1 tab by mouth two times a day  #60 x 11   Entered and Authorized by:   Julieanne Manson MD   Signed by:   Julieanne Manson MD on 01/11/2011   Method used:   Faxed to ...       [facility] (retail)       [address] [place], [place]  [address]       Ph: [phone]       Fax: [phone]   RxID:   [id number]  NORVASC 5 MG TABS (AMLODIPINE BESYLATE) 1 tab by mouth daily  #30 x 11   Entered and Authorized by:   Julieanne Manson MD   Signed by:   Julieanne Manson MD on 01/11/2011   Method used:   Faxed to ...       [facility] (retail)       [address] [place], [place]  [address]       Ph: [phone]       Fax: [phone]   RxID:   [id number] ACTOS 45 MG TABS (PIOGLITAZONE HCL) 1 tab by mouth daily  #30 x 11   Entered and Authorized by:   Julieanne Manson MD   Signed by:   Julieanne Manson MD on 01/11/2011   Method used:   Faxed to ...       [facility] (retail)       [address] [place], [place]  [address]       Ph: [phone]       Fax: [phone]   RxID:   [id number] LOVAZA 1 GM CAPS (OMEGA-3-ACID ETHYL ESTERS) 4 caps by mouth daily  #120 x 11   Entered and Authorized by:   Julieanne Manson MD   Signed by:   Julieanne Manson MD on 01/11/2011   Method used:   Faxed to ...       [facility] (retail)       [address] [place], [place]  [address]       Ph: [phone]       Fax: [phone]   RxID:   [id number] XYZAL 5 MG TABS (LEVOCETIRIZINE DIHYDROCHLORIDE) 1 tab by mouth daily for allergies  #30 x 11   Entered and Authorized by:   Julieanne Manson MD   Signed by:   Julieanne Manson MD on 01/11/2011   Method used:    Faxed to ...       [facility] (retail)       [address] [place], [place]  [address]       Ph: [phone]       Fax: [phone]   RxID:   [id number] VITAMIN B-12 1000 MCG TABS (CYANOCOBALAMIN) 1 tab by mouth daily  #30 x 11   Entered and Authorized by:   Julieanne Manson MD   Signed by:   Julieanne Manson MD on 01/11/2011   Method used:   Faxed to ...       [facility] (retail)       [address] [place], [place]  [address]       Ph: [phone]       Fax: [phone]   RxID:   [id number] LANTUS SOLOSTAR 100 UNIT/ML  SOLN (INSULIN GLARGINE) 60 units subcutaneously qhs  #1 month x 11   Entered and Authorized by:   Julieanne Manson MD   Signed by:   Julieanne Manson MD on 01/11/2011   Method used:   Faxed to ...       [facility] (retail)       [address] [place], [place]  [address]       Ph: [phone]  Fax: [phone]   RxID:   [id number] LIPITOR 20 MG  TABS (ATORVASTATIN CALCIUM) 1 tab by mouth daily  #30 x 11   Entered and Authorized by:   Julieanne Manson MD   Signed by:   Julieanne Manson MD on 01/11/2011   Method used:   Faxed to ...       [facility] (retail)       [address] [place], [place]  [address]       Ph: [phone]       Fax: [phone]   RxID:   [id number] NEURONTIN 600 MG  TABS (GABAPENTIN) 2 tabs by mouth at bedtime--pt. preference.  #60 x 11   Entered and Authorized by:   Julieanne Manson MD   Signed by:   Julieanne Manson MD on 01/11/2011   Method used:   Faxed to ...       [facility] - [facility] (retail)       [address] [place], [place]  [address]       Ph: [phone]       Fax: [phone]   RxID:   [id number]    Orders Added: 1)  Capillary Blood  Glucose/CBG [82948] 2)  T-Comprehensive Metabolic Panel [80053-22900] 3)  T-Lipid Profile [80061-22930] 4)  T-CBC w/Diff [25427-06237] 5)  T-TSH [62831-51761] 6)  T- Hemoglobin A1C [83036-23375] 7)  T-Urine Microalbumin w/creat. ratio [82043-[address]] 8)  Est. Patient Level III [60737] 9)  UA Dipstick w/o Micro (automated)  [81003]    Last LDL:                                                 69 (06/02/2010 9:55:00 PM)        Diabetic Foot Exam Last Podiatry Exam Date: 04/08/2009 Foot Inspection Is there a history of a foot ulcer?              Yes Is there a foot ulcer now?              No Can the patient see the bottom of their feet?          Yes Are the shoes appropriate in style and fit?          Yes Is there swelling or an abnormal foot shape?          Yes Are the toenails long?                Yes Are the toenails thick?                Yes Are the toenails ingrown?              No Is there heavy callous build-up?              Yes Is there a claw toe deformity?                          Yes Is there elevated skin temperature?            No Is there limited ankle dorsiflexion?            No Is there foot or ankle muscle weakness?  No Do you have pain in calf while walking?           No         10-g (5.07) Semmes-Weinstein Monofilament Test Performed by: Gaylyn Cheers RN          Right Foot          Left Foot Visual Inspection               Test Control      abnormal         abnormal Site 1         abnormal         abnormal Site 2         abnormal         abnormal Site 3         abnormal         abnormal Site 4         abnormal         abnormal Site 5         abnormal         abnormal Site 6         abnormal         abnormal Site 7         abnormal         abnormal Site 8         abnormal         abnormal Site 9         abnormal         abnormal Site 10         abnormal         abnormal  Impression      abnormal         abnormal     Appended  Document: U/A    Lab Visit  Laboratory Results   Urine Tests  Date/Time Received: January 11, 2011 12:40 PM   Routine Urinalysis   Color: lt. yellow Glucose: >=1000   (Normal Range: Negative) Bilirubin: negative   (Normal Range: Negative) Ketone: negative   (Normal Range: Negative) Spec. Gravity: 1.010   (Normal Range: 1.003-1.035) Blood: negative   (Normal Range: Negative) pH: 6.0   (Normal Range: 5.0-8.0) Protein: negative   (Normal Range: Negative) Urobilinogen: 0.2   (Normal Range: 0-1) Nitrite: negative   (Normal Range: Negative) Leukocyte Esterace: negative   (Normal Range: Negative)      Orders Today: Flu Vaccine 69yrs + [40102] Admin 1st Vaccine [72536]    Immunizations Administered:  Influenza Vaccine # 1:    Vaccine Type: Fluvax 3+    Site: left deltoid    Mfr: [employer]Smith[employer]    Dose: 0.5 ml    Route: IM    Given by: Hale Drone CMA    Exp. Date: 05/26/2011    Lot #: [id number]    VIS given: 06/20/10 version given January 11, 2011.  Flu Vaccine Consent Questions:    Do you have a history of severe allergic reactions to this vaccine? no    Any prior history of allergic reactions to egg and/or gelatin? no    Do you have a sensitivity to the preservative Thimersol? no    Do you have a past history of Guillan-Barre Syndrome? no    Do you currently have an acute febrile illness? no    Have you ever had a severe reaction to latex? no    Vaccine information given and explained to  patient? yes

## 2011-01-31 ENCOUNTER — Encounter (INDEPENDENT_AMBULATORY_CARE_PROVIDER_SITE_OTHER): Payer: Self-pay | Admitting: Internal Medicine

## 2011-02-01 NOTE — Letter (Signed)
Summary: [facility] FOR [name]  AP[facility]MENT FOR [name]   Imported By: Arta Bruce 01/23/2011 15:44:14  _____________________________________________________________________  External Attachment:    Type:   Image     Comment:   External Document

## 2011-02-06 NOTE — Letter (Signed)
Summary: Lipid Letter  [facility]  [address] [place], [place] [address]   Phone: [phone]  Fax: [phone]    01/31/2011  Caleb Joseph [address] [place], [place]  [address]  Dear Caleb Joseph:  We have carefully reviewed your last lipid profile from 01/11/2011 and the results are noted below with a summary of recommendations for lipid management.    Cholesterol:       138     Goal: <200   HDL "good" Cholesterol:   38     Goal: >45   LDL "bad" Cholesterol:   68     Goal: <70   Triglycerides:       161     Goal: <150    Cholesterol in some ways at goal.  Recommend regular physical activity.  Your anemia is about the same--stay on iron.  Your sugar was high.    TLC Diet (Therapeutic Lifestyle Change): Saturated Fats & Transfatty acids should be kept < 7% of total calories ***Reduce Saturated Fats Polyunstaurated Fat can be up to 10% of total calories Monounsaturated Fat Fat can be up to 20% of total calories Total Fat should be no greater than 25-35% of total calories Carbohydrates should be 50-60% of total calories Protein should be approximately 15% of total calories Fiber should be at least 20-30 grams a day ***Increased fiber may help lower LDL Total Cholesterol should be < 200mg /day Consider adding plant stanol/sterols to diet (example: Benacol spread) ***A higher intake of unsaturated fat may reduce Triglycerides and Increase HDL    Adjunctive Measures (may lower LIPIDS and reduce risk of Heart Attack) include: Aerobic Exercise (20-30 minutes 3-4 times a week) Limit Alcohol Consumption Weight Reduction Aspirin 75-81 mg a day by mouth (if not allergic or contraindicated) Dietary Fiber 20-30 grams a day by mouth     Current Medications: 1)    Neurontin 600 Mg  Tabs (Gabapentin) .... 2 tabs by mouth at bedtime--pt. preference. 2)    Lipitor 20 Mg  Tabs (Atorvastatin calcium) .Marland Kitchen.. 1 tab by mouth daily 3)    Lantus Solostar  100 Unit/ml  Soln (Insulin glargine) .... 60 units subcutaneously qhs 4)    Albuterol 90 Mcg/act  Aers (Albuterol) .... 2 puffs q 4h as needed wheeze, tightness 5)    Cymbalta 30 Mg Cpep (Duloxetine hcl) .... 2 caps by mouth q hs 6)    Vitamin B-12 1000 Mcg Tabs (Cyanocobalamin) .Marland Kitchen.. 1 tab by mouth daily 7)    Xyzal 5 Mg Tabs (Levocetirizine dihydrochloride) .Marland Kitchen.. 1 tab by mouth daily for allergies 8)    Depakote Er 500 Mg Xr24h-tab (Divalproex sodium) .Marland Kitchen.. 1 by mouth at bedtime 9)    Glucometer Test Strips  .... Test three times a day 10)    Ferrous Sulfate 325 (65 Fe) Mg Tbec (Ferrous sulfate) .Marland Kitchen.. 1 tab by mouth two times a day 11)    Lovaza 1 Gm Caps (Omega-3-acid ethyl esters) .... 4 caps by mouth daily 12)    Actos 45 Mg Tabs (Pioglitazone hcl) .Marland Kitchen.. 1 tab by mouth daily 13)    Norvasc 5 Mg Tabs (Amlodipine besylate) .Marland Kitchen.. 1 tab by mouth daily 14)    Novolog Flexpen 100 Unit/ml Soln (Insulin aspart) .... As directed per sliding scale  If you have any questions, please call. We appreciate being able to work with you.   Sincerely,    [facility] Julieanne Manson MD

## 2011-02-26 LAB — GLUCOSE, CAPILLARY
Glucose-Capillary: 105 mg/dL — ABNORMAL HIGH (ref 70–99)
Glucose-Capillary: 116 mg/dL — ABNORMAL HIGH (ref 70–99)
Glucose-Capillary: 155 mg/dL — ABNORMAL HIGH (ref 70–99)
Glucose-Capillary: 194 mg/dL — ABNORMAL HIGH (ref 70–99)
Glucose-Capillary: 198 mg/dL — ABNORMAL HIGH (ref 70–99)
Glucose-Capillary: 218 mg/dL — ABNORMAL HIGH (ref 70–99)
Glucose-Capillary: 219 mg/dL — ABNORMAL HIGH (ref 70–99)
Glucose-Capillary: 256 mg/dL — ABNORMAL HIGH (ref 70–99)
Glucose-Capillary: 412 mg/dL — ABNORMAL HIGH (ref 70–99)
Glucose-Capillary: 450 mg/dL — ABNORMAL HIGH (ref 70–99)
Glucose-Capillary: 67 mg/dL — ABNORMAL LOW (ref 70–99)

## 2011-02-26 LAB — COMPREHENSIVE METABOLIC PANEL
ALT: 23 U/L (ref 0–53)
AST: 21 U/L (ref 0–37)
Albumin: 3.3 g/dL — ABNORMAL LOW (ref 3.5–5.2)
Alkaline Phosphatase: 64 U/L (ref 39–117)
Alkaline Phosphatase: 83 U/L (ref 39–117)
BUN: 42 mg/dL — ABNORMAL HIGH (ref 6–23)
Calcium: 8.8 mg/dL (ref 8.4–10.5)
Creatinine, Ser: 1.49 mg/dL (ref 0.4–1.5)
Glucose, Bld: 171 mg/dL — ABNORMAL HIGH (ref 70–99)
Glucose, Bld: 524 mg/dL (ref 70–99)
Potassium: 4.4 mEq/L (ref 3.5–5.1)
Potassium: 5.6 mEq/L — ABNORMAL HIGH (ref 3.5–5.1)
Sodium: 129 mEq/L — ABNORMAL LOW (ref 135–145)
Total Protein: 6.4 g/dL (ref 6.0–8.3)
Total Protein: 7.9 g/dL (ref 6.0–8.3)

## 2011-02-26 LAB — DIFFERENTIAL
Basophils Absolute: 0 10*3/uL (ref 0.0–0.1)
Eosinophils Absolute: 0.1 10*3/uL (ref 0.0–0.7)
Eosinophils Absolute: 0.2 10*3/uL (ref 0.0–0.7)
Eosinophils Relative: 1 % (ref 0–5)
Eosinophils Relative: 3 % (ref 0–5)
Lymphocytes Relative: 43 % (ref 12–46)
Lymphs Abs: 1.3 10*3/uL (ref 0.7–4.0)
Lymphs Abs: 2.1 10*3/uL (ref 0.7–4.0)
Monocytes Absolute: 0.3 10*3/uL (ref 0.1–1.0)
Monocytes Absolute: 0.4 10*3/uL (ref 0.1–1.0)
Monocytes Relative: 6 % (ref 3–12)

## 2011-02-26 LAB — BASIC METABOLIC PANEL
BUN: 31 mg/dL — ABNORMAL HIGH (ref 6–23)
Chloride: 107 mEq/L (ref 96–112)
GFR calc non Af Amer: >60 mL/min (ref >60)
Glucose, Bld: 162 mg/dL — ABNORMAL HIGH (ref 70–99)
Potassium: 4.3 mEq/L (ref 3.5–5.1)
Sodium: 138 mEq/L (ref 135–145)

## 2011-02-26 LAB — CARDIAC PANEL(CRET KIN+CKTOT+MB+TROPI)
CK, MB: 13.6 ng/mL — ABNORMAL HIGH (ref 0.3–4.0)
CK, MB: 7.9 ng/mL — ABNORMAL HIGH (ref 0.3–4.0)
Relative Index: 7 — ABNORMAL HIGH (ref 0.0–2.5)
Relative Index: 8.7 — ABNORMAL HIGH (ref 0.0–2.5)
Total CK: 120 U/L (ref 7–232)
Troponin I: 0.04 ng/mL (ref 0.00–0.06)
Troponin I: 0.05 ng/mL (ref 0.00–0.06)

## 2011-02-26 LAB — CBC
HCT: 29.8 % — ABNORMAL LOW (ref 39.0–52.0)
HCT: 31.3 % — ABNORMAL LOW (ref 39.0–52.0)
Hemoglobin: 10.4 g/dL — ABNORMAL LOW (ref 13.0–17.0)
Hemoglobin: 11.1 g/dL — ABNORMAL LOW (ref 13.0–17.0)
Hemoglobin: 12.1 g/dL — ABNORMAL LOW (ref 13.0–17.0)
MCHC: 34.6 g/dL (ref 30.0–36.0)
MCHC: 35.6 g/dL (ref 30.0–36.0)
MCV: 91 fL (ref 78.0–100.0)
MCV: 91 fL (ref 78.0–100.0)
Platelets: 116 10*3/uL — ABNORMAL LOW (ref 150–400)
RBC: 3.83 MIL/uL — ABNORMAL LOW (ref 4.22–5.81)
RDW: 12.9 % (ref 11.5–15.5)
RDW: 13 % (ref 11.5–15.5)

## 2011-02-26 LAB — URINE MICROSCOPIC-ADD ON

## 2011-02-26 LAB — URINALYSIS, ROUTINE W REFLEX MICROSCOPIC
Leukocytes, UA: NEGATIVE
Nitrite: NEGATIVE
Specific Gravity, Urine: 1.031 — ABNORMAL HIGH (ref 1.005–1.030)
Urobilinogen, UA: 0.2 mg/dL (ref 0.0–1.0)
pH: 5.5 (ref 5.0–8.0)

## 2011-02-26 LAB — CK TOTAL AND CKMB (NOT AT ARMC): CK, MB: 12.9 ng/mL — ABNORMAL HIGH (ref 0.3–4.0)

## 2011-02-26 LAB — URINE CULTURE

## 2011-02-26 LAB — VALPROIC ACID LEVEL: Valproic Acid Lvl: <10 ug/mL — ABNORMAL LOW (ref 50.0–100.0)

## 2011-02-26 LAB — C-REACTIVE PROTEIN: CRP: 0.3 mg/dL — ABNORMAL LOW (ref <0.6)

## 2011-02-26 LAB — PROTIME-INR
INR: 1.08 (ref 0.00–1.49)
Prothrombin Time: 13.9 seconds (ref 11.6–15.2)

## 2011-02-26 LAB — APTT: aPTT: 27 seconds (ref 24–37)

## 2011-03-01 LAB — BASIC METABOLIC PANEL
BUN: 11 mg/dL (ref 6–23)
BUN: 14 mg/dL (ref 6–23)
BUN: 18 mg/dL (ref 6–23)
CO2: 27 mEq/L (ref 19–32)
CO2: 29 mEq/L (ref 19–32)
Calcium: 8.6 mg/dL (ref 8.4–10.5)
Calcium: 9.3 mg/dL (ref 8.4–10.5)
Chloride: 102 mEq/L (ref 96–112)
Creatinine, Ser: 1.2 mg/dL (ref 0.4–1.5)
Creatinine, Ser: 1.2 mg/dL (ref 0.4–1.5)
Creatinine, Ser: 1.26 mg/dL (ref 0.4–1.5)
GFR calc Af Amer: >60 mL/min (ref >60)
GFR calc non Af Amer: 54 mL/min — ABNORMAL LOW (ref >60)
GFR calc non Af Amer: >60 mL/min (ref >60)
Glucose, Bld: 137 mg/dL — ABNORMAL HIGH (ref 70–99)
Glucose, Bld: 159 mg/dL — ABNORMAL HIGH (ref 70–99)
Glucose, Bld: 202 mg/dL — ABNORMAL HIGH (ref 70–99)
Potassium: 5.3 mEq/L — ABNORMAL HIGH (ref 3.5–5.1)
Sodium: 134 mEq/L — ABNORMAL LOW (ref 135–145)

## 2011-03-01 LAB — GLUCOSE, CAPILLARY
Glucose-Capillary: 127 mg/dL — ABNORMAL HIGH (ref 70–99)
Glucose-Capillary: 139 mg/dL — ABNORMAL HIGH (ref 70–99)
Glucose-Capillary: 142 mg/dL — ABNORMAL HIGH (ref 70–99)
Glucose-Capillary: 142 mg/dL — ABNORMAL HIGH (ref 70–99)
Glucose-Capillary: 163 mg/dL — ABNORMAL HIGH (ref 70–99)
Glucose-Capillary: 169 mg/dL — ABNORMAL HIGH (ref 70–99)
Glucose-Capillary: 170 mg/dL — ABNORMAL HIGH (ref 70–99)
Glucose-Capillary: 174 mg/dL — ABNORMAL HIGH (ref 70–99)
Glucose-Capillary: 183 mg/dL — ABNORMAL HIGH (ref 70–99)
Glucose-Capillary: 186 mg/dL — ABNORMAL HIGH (ref 70–99)
Glucose-Capillary: 272 mg/dL — ABNORMAL HIGH (ref 70–99)
Glucose-Capillary: 509 mg/dL (ref 70–99)
Glucose-Capillary: 532 mg/dL (ref 70–99)
Glucose-Capillary: 74 mg/dL (ref 70–99)

## 2011-03-01 LAB — TISSUE CULTURE

## 2011-03-01 LAB — CBC
HCT: 26 % — ABNORMAL LOW (ref 39.0–52.0)
HCT: 26 % — ABNORMAL LOW (ref 39.0–52.0)
Hemoglobin: 10.9 g/dL — ABNORMAL LOW (ref 13.0–17.0)
Hemoglobin: 9.1 g/dL — ABNORMAL LOW (ref 13.0–17.0)
MCHC: 34.2 g/dL (ref 30.0–36.0)
MCHC: 34.6 g/dL (ref 30.0–36.0)
MCHC: 34.9 g/dL (ref 30.0–36.0)
MCV: 90.7 fL (ref 78.0–100.0)
MCV: 90.9 fL (ref 78.0–100.0)
Platelets: 136 10*3/uL — ABNORMAL LOW (ref 150–400)
Platelets: 169 10*3/uL (ref 150–400)
Platelets: 183 10*3/uL (ref 150–400)
RBC: 3.44 MIL/uL — ABNORMAL LOW (ref 4.22–5.81)
RDW: 12.6 % (ref 11.5–15.5)
RDW: 12.8 % (ref 11.5–15.5)
RDW: 12.8 % (ref 11.5–15.5)
RDW: 12.9 % (ref 11.5–15.5)
WBC: 7.3 10*3/uL (ref 4.0–10.5)

## 2011-03-01 LAB — URINE CULTURE: Culture: NO GROWTH

## 2011-03-01 LAB — CULTURE, BLOOD (ROUTINE X 2)
Culture: NO GROWTH
Culture: NO GROWTH

## 2011-03-01 LAB — POCT I-STAT 3, VENOUS BLOOD GAS (G3P V)
O2 Saturation: 72 %
TCO2: 30 mmol/L (ref 0–100)
pH, Ven: 7.373 — ABNORMAL HIGH (ref 7.250–7.300)

## 2011-03-01 LAB — HEMOGLOBIN A1C
Hgb A1c MFr Bld: 12.1 % — ABNORMAL HIGH (ref 4.6–6.1)
Mean Plasma Glucose: 301 mg/dL

## 2011-03-01 LAB — DIFFERENTIAL
Basophils Absolute: 0 10*3/uL (ref 0.0–0.1)
Basophils Absolute: 0 10*3/uL (ref 0.0–0.1)
Basophils Relative: 0 % (ref 0–1)
Eosinophils Relative: 5 % (ref 0–5)
Lymphocytes Relative: 20 % (ref 12–46)
Lymphs Abs: 1.4 10*3/uL (ref 0.7–4.0)
Monocytes Absolute: 0.3 10*3/uL (ref 0.1–1.0)
Monocytes Absolute: 0.6 10*3/uL (ref 0.1–1.0)
Neutro Abs: 2.7 10*3/uL (ref 1.7–7.7)
Neutro Abs: 5.1 10*3/uL (ref 1.7–7.7)

## 2011-03-01 LAB — URINALYSIS, ROUTINE W REFLEX MICROSCOPIC
Ketones, ur: NEGATIVE mg/dL
Leukocytes, UA: NEGATIVE
Nitrite: NEGATIVE
Protein, ur: NEGATIVE mg/dL

## 2011-03-01 LAB — CULTURE, ROUTINE-ABSCESS

## 2011-03-01 LAB — ANAEROBIC CULTURE

## 2011-03-01 LAB — C-REACTIVE PROTEIN: CRP: 2.8 mg/dL — ABNORMAL HIGH (ref <0.6)

## 2011-03-01 LAB — URINE MICROSCOPIC-ADD ON

## 2011-03-08 LAB — CROSSMATCH
ABO/RH(D): A POS
Antibody Screen: NEGATIVE

## 2011-03-08 LAB — BASIC METABOLIC PANEL
BUN: 14 mg/dL (ref 6–23)
BUN: 16 mg/dL (ref 6–23)
BUN: 19 mg/dL (ref 6–23)
BUN: 28 mg/dL — ABNORMAL HIGH (ref 6–23)
CO2: 27 mEq/L (ref 19–32)
CO2: 28 mEq/L (ref 19–32)
CO2: 29 mEq/L (ref 19–32)
Calcium: 8.2 mg/dL — ABNORMAL LOW (ref 8.4–10.5)
Chloride: 103 mEq/L (ref 96–112)
Chloride: 103 mEq/L (ref 96–112)
Chloride: 104 mEq/L (ref 96–112)
Chloride: 107 mEq/L (ref 96–112)
Chloride: 98 mEq/L (ref 96–112)
Creatinine, Ser: 0.87 mg/dL (ref 0.4–1.5)
Creatinine, Ser: 0.92 mg/dL (ref 0.4–1.5)
Creatinine, Ser: 0.96 mg/dL (ref 0.4–1.5)
Creatinine, Ser: 1.07 mg/dL (ref 0.4–1.5)
GFR calc Af Amer: >60 mL/min (ref >60)
GFR calc Af Amer: >60 mL/min (ref >60)
GFR calc Af Amer: >60 mL/min (ref >60)
GFR calc non Af Amer: >60 mL/min (ref >60)
GFR calc non Af Amer: >60 mL/min (ref >60)
GFR calc non Af Amer: >60 mL/min (ref >60)
Glucose, Bld: 110 mg/dL — ABNORMAL HIGH (ref 70–99)
Glucose, Bld: 152 mg/dL — ABNORMAL HIGH (ref 70–99)
Glucose, Bld: 78 mg/dL (ref 70–99)
Potassium: 4.4 mEq/L (ref 3.5–5.1)
Potassium: 4.4 mEq/L (ref 3.5–5.1)
Potassium: 4.5 mEq/L (ref 3.5–5.1)
Potassium: 4.6 mEq/L (ref 3.5–5.1)
Sodium: 131 mEq/L — ABNORMAL LOW (ref 135–145)
Sodium: 135 mEq/L (ref 135–145)
Sodium: 139 mEq/L (ref 135–145)

## 2011-03-08 LAB — GLUCOSE, CAPILLARY
Glucose-Capillary: 107 mg/dL — ABNORMAL HIGH (ref 70–99)
Glucose-Capillary: 120 mg/dL — ABNORMAL HIGH (ref 70–99)
Glucose-Capillary: 149 mg/dL — ABNORMAL HIGH (ref 70–99)
Glucose-Capillary: 151 mg/dL — ABNORMAL HIGH (ref 70–99)
Glucose-Capillary: 152 mg/dL — ABNORMAL HIGH (ref 70–99)
Glucose-Capillary: 171 mg/dL — ABNORMAL HIGH (ref 70–99)
Glucose-Capillary: 176 mg/dL — ABNORMAL HIGH (ref 70–99)
Glucose-Capillary: 185 mg/dL — ABNORMAL HIGH (ref 70–99)
Glucose-Capillary: 237 mg/dL — ABNORMAL HIGH (ref 70–99)
Glucose-Capillary: 250 mg/dL — ABNORMAL HIGH (ref 70–99)
Glucose-Capillary: 295 mg/dL — ABNORMAL HIGH (ref 70–99)
Glucose-Capillary: 58 mg/dL — ABNORMAL LOW (ref 70–99)
Glucose-Capillary: 76 mg/dL (ref 70–99)
Glucose-Capillary: 78 mg/dL (ref 70–99)
Glucose-Capillary: 85 mg/dL (ref 70–99)
Glucose-Capillary: 89 mg/dL (ref 70–99)
Glucose-Capillary: 92 mg/dL (ref 70–99)
Glucose-Capillary: 97 mg/dL (ref 70–99)
Glucose-Capillary: 97 mg/dL (ref 70–99)
Glucose-Capillary: 250 mg/dL — ABNORMAL HIGH (ref 70–99)
Glucose-Capillary: 272 mg/dL — ABNORMAL HIGH (ref 70–99)
Glucose-Capillary: 304 mg/dL — ABNORMAL HIGH (ref 70–99)

## 2011-03-08 LAB — SODIUM, URINE, RANDOM: Sodium, Ur: 25 mEq/L

## 2011-03-08 LAB — LIPID PANEL
Cholesterol: 103 mg/dL (ref 0–200)
HDL: 20 mg/dL — ABNORMAL LOW (ref >39)

## 2011-03-08 LAB — CULTURE, BLOOD (ROUTINE X 2): Culture: NO GROWTH

## 2011-03-08 LAB — CBC
HCT: 22.2 % — ABNORMAL LOW (ref 39.0–52.0)
HCT: 25.8 % — ABNORMAL LOW (ref 39.0–52.0)
HCT: 27.6 % — ABNORMAL LOW (ref 39.0–52.0)
HCT: 29.5 % — ABNORMAL LOW (ref 39.0–52.0)
HCT: 30.8 % — ABNORMAL LOW (ref 39.0–52.0)
Hemoglobin: 7.6 g/dL — CL (ref 13.0–17.0)
Hemoglobin: 8.8 g/dL — ABNORMAL LOW (ref 13.0–17.0)
Hemoglobin: 9.3 g/dL — ABNORMAL LOW (ref 13.0–17.0)
Hemoglobin: 9.8 g/dL — ABNORMAL LOW (ref 13.0–17.0)
Hemoglobin: 9.9 g/dL — ABNORMAL LOW (ref 13.0–17.0)
MCHC: 33.4 g/dL (ref 30.0–36.0)
MCHC: 33.4 g/dL (ref 30.0–36.0)
MCHC: 34 g/dL (ref 30.0–36.0)
MCV: 86.3 fL (ref 78.0–100.0)
MCV: 87.6 fL (ref 78.0–100.0)
MCV: 87.8 fL (ref 78.0–100.0)
MCV: 87.8 fL (ref 78.0–100.0)
MCV: 87.9 fL (ref 78.0–100.0)
Platelets: 157 10*3/uL (ref 150–400)
Platelets: 178 10*3/uL (ref 150–400)
Platelets: 203 10*3/uL (ref 150–400)
Platelets: 203 10*3/uL (ref 150–400)
Platelets: 206 10*3/uL (ref 150–400)
RBC: 3.18 MIL/uL — ABNORMAL LOW (ref 4.22–5.81)
RBC: 3.3 MIL/uL — ABNORMAL LOW (ref 4.22–5.81)
RBC: 3.36 MIL/uL — ABNORMAL LOW (ref 4.22–5.81)
RDW: 14 % (ref 11.5–15.5)
RDW: 14.7 % (ref 11.5–15.5)
RDW: 14.8 % (ref 11.5–15.5)
RDW: 15 % (ref 11.5–15.5)
WBC: 5.2 10*3/uL (ref 4.0–10.5)
WBC: 6.1 10*3/uL (ref 4.0–10.5)
WBC: 6.3 10*3/uL (ref 4.0–10.5)
WBC: 6.8 10*3/uL (ref 4.0–10.5)
WBC: 7.3 10*3/uL (ref 4.0–10.5)

## 2011-03-08 LAB — COMPREHENSIVE METABOLIC PANEL
Alkaline Phosphatase: 86 U/L (ref 39–117)
BUN: 29 mg/dL — ABNORMAL HIGH (ref 6–23)
GFR calc non Af Amer: 50 mL/min — ABNORMAL LOW (ref >60)
Glucose, Bld: 302 mg/dL — ABNORMAL HIGH (ref 70–99)
Potassium: 4.7 mEq/L (ref 3.5–5.1)
Total Bilirubin: 0.8 mg/dL (ref 0.3–1.2)
Total Protein: 6.3 g/dL (ref 6.0–8.3)

## 2011-03-08 LAB — URINE MICROSCOPIC-ADD ON

## 2011-03-08 LAB — PHOSPHORUS: Phosphorus: 3.8 mg/dL (ref 2.3–4.6)

## 2011-03-08 LAB — POCT I-STAT, CHEM 8
BUN: 23 mg/dL (ref 6–23)
Chloride: 99 mEq/L (ref 96–112)
Creatinine, Ser: 1.1 mg/dL (ref 0.4–1.5)
Glucose, Bld: 397 mg/dL — ABNORMAL HIGH (ref 70–99)
Potassium: 4.4 mEq/L (ref 3.5–5.1)
Sodium: 131 mEq/L — ABNORMAL LOW (ref 135–145)

## 2011-03-08 LAB — TSH: TSH: 3.135 u[IU]/mL (ref 0.350–4.500)

## 2011-03-08 LAB — OSMOLALITY: Osmolality: 282 mOsm/kg (ref 275–300)

## 2011-03-08 LAB — URINALYSIS, ROUTINE W REFLEX MICROSCOPIC
Bilirubin Urine: NEGATIVE
Nitrite: NEGATIVE
Specific Gravity, Urine: 1.024 (ref 1.005–1.030)
Urobilinogen, UA: 1 mg/dL (ref 0.0–1.0)
pH: 8 (ref 5.0–8.0)

## 2011-03-08 LAB — MAGNESIUM: Magnesium: 2 mg/dL (ref 1.5–2.5)

## 2011-03-08 LAB — RAPID URINE DRUG SCREEN, HOSP PERFORMED: Barbiturates: NOT DETECTED

## 2011-03-08 LAB — HEMOGLOBIN A1C
Hgb A1c MFr Bld: 11.1 % — ABNORMAL HIGH (ref 4.6–6.1)
Mean Plasma Glucose: 272 mg/dL

## 2011-04-10 NOTE — H&P (Signed)
NAME:  Caleb Joseph, Caleb Joseph NO.:  [id number]   MEDICAL RECORD NO.:  [id number]          PATIENT TYPE:  INP   LOCATION:  1311                         FACILITY:  [facility]   PHYSICIAN:  Lonia Blood, M.D.      DATE OF BIRTH:  12-18-1955   DATE OF ADMISSION:  02/06/2009  DATE OF DISCHARGE:                              HISTORY & PHYSICAL   PRIMARY CARE PHYSICIAN:  [facility].   PRESENTING COMPLAINT:  Fever and lower abdominal pain.   HISTORY OF PRESENT ILLNESS:  The patient is a 55 year old gentleman with  multiple medical problems including diabetes that goes to [facility]  for his care.  The patient presented today with some fever and pain  involving his pelvic area.  He was apparently seen by his physician  within the week and was given oral ciprofloxacin on the 10th.  He has  continued to have chills, fever and weakness, so he decided to come to  the emergency room.  He has had nausea but denied any vomiting.  He has  also had some dysuria but denied any diarrhea.  The pain has been going  on since on the 10th.  No radiation.  No aggravating factors and no  relieving factors.  He denied any melena.  No bright red blood per  rectum.  No hematemesis.   His past medical history significant for:  1. Diabetes.  2. Bipolar disorder.  3. Hypertension.  4. Dyslipidemia.  5. Seasonal allergies.   ALLERGIES:  The patient has no known drug allergies.   MEDICATIONS:  Include:  1. Neurontin 600 mg p.o. every night.  2. Lipitor 20 mg daily.  3. Avandia 8 mg daily.  4. Lantus 50 units subcutaneously every night.  5. Lisinopril 40 mg daily.  6. Klor-Con M 10 mEq p.o. b.i.d.  7. Albuterol 2 puffs p.r.n.  8. Trazodone 50 mg every night.  9. Cymbalta 30 mg every night.  10.Vitamin B12 1000 mcg daily.  11.Depakote ER 500 mg every night.  12.Fexofenadine 108 mg daily.  13.Ciprofloxacin 500 mg twice a day since on the 10th.  14.Promethazine 12.5 to 25 mg p.r.n.   SOCIAL  HISTORY:  The patient lives in [place].  He occasionally  smokes tobacco but not on a daily basis.  Denied any alcohol or IV drug  use.   FAMILY HISTORY:  Mainly diabetes and hypertension.   REVIEW OF SYSTEMS:  Mainly weakness.  Otherwise, the rest of the 14-  point review of systems is per HPI.   PHYSICAL EXAMINATION:  Temperature is 102.9, blood pressure 160/91,  pulse 98, respiratory rate 20, saturations 97% on room air.  GENERAL:  The patient looks acutely ill but in no acute distress.  HEENT:  PERRL.  EOMI.  He has poor dentition.  NECK:  Is supple.  No JVD.  No lymphadenopathy.  RESPIRATORY:  He has good air entry bilaterally.  No wheezes.  No rales.  CARDIOVASCULAR SYSTEM:  He has S1 and S2.  No murmur.  ABDOMEN:  Is soft, nontender with positive bowel sounds.  EXTREMITIES:  No edema,  cyanosis or clubbing.   LABORATORY DATA:  Sodium is 131, potassium 4.4, chloride 99, BUN 23,  creatinine 1.1, glucose 397.  Calcium is 1.07.  His urinalysis showed  glucose more than 1000, small hemoglobin, some proteinuria, WBCs 11-20.   ASSESSMENT:  Therefore, this is a 55 year old gentleman with history of  diabetes, hypertension, etc., presenting with lower abdominal pain,  evidence of partially treated urinary tract infection and possibly  cystitis versus prostatitis.  The patient is currently stable but  probably has untreated or fairly treated infection.  He is still spiking  a fever of 102.   PLAN:  Therefore, will be:  1. Urinary tract infection versus prostatitis.  Will admit the      patient.  I will get a CT of the pelvis to rule out any prostate      enlargement.  Start him on IV Zosyn.  Get urine culture and blood      cultures.  Depending on the results, if positive, we will adjust      his antibiotic regimen.  2. Diabetes.  I will check hemoglobin A1c, continue with his home      regimen of insulin and titrate accordingly.  3. Bipolar disorder.  I will continue with his  home medication again      during this hospitalization.  4. Diabetic neuropathy.  I will continue his Neurontin also as part of      his regimen.   Further management will depend on the patient's response to these  measures and results of culture and sensitivity.      Lonia Blood, M.D.  Electronically Signed     LG/MEDQ  D:  02/07/2009  T:  02/07/2009  Job:  [id number]

## 2011-04-10 NOTE — Consult Note (Signed)
NAME:  WASIM, HURLBUT NO.:  [id number]   MEDICAL RECORD NO.:  [id number]          PATIENT TYPE:  INP   LOCATION:  1311                         FACILITY:  [facility]   PHYSICIAN:  Valetta Fuller, M.D.  DATE OF BIRTH:  1955/11/29   DATE OF CONSULTATION:  02/08/2009  DATE OF DISCHARGE:                                 CONSULTATION   REFERRING PHYSICIAN:  Dr. Christella Noa.   REASON FOR CONSULTATION/CHIEF COMPLAINT:  Prostate abscess.   HISTORY OF PRESENT ILLNESS:  This is a 55 year old gentleman with past  medical history significant for bipolar disorder, diabetes mellitus, and  diabetic neuropathy.  He was admitted on February 07, 2009, for treatment  of fever, lower abdominal/pelvic pain, and dysuria accompanied by fever,  chills, nausea, and increasing weakness.  He states the pain began  approximately 6 days ago and worsened.  He saw his primary care Courtenay Hirth  at that time who diagnosed him with acute cystitis and started Cipro.  He felt the symptoms were progressive therefore he did come to the  emergency room.  He denies urgency, vomiting, and hematuria.  He does  have frequency.  He denies any perineal pain or abnormal urethral  discharge.  He denies any significant urological history.   PAST MEDICAL HISTORY:  1. Diabetes mellitus.  2. Bipolar.  3. Hypertension.  4. Dyslipidemia.  5. Seasonal allergic rhinitis.   PAST SURGICAL HISTORY:  I and D for a right foot ulcer.   FAMILY HISTORY:  He has significant family history on his maternal side  for diabetes and hypertension.   SOCIAL HISTORY:  He is divorced and lives in [place].  He works  for the [employer].  He occasionally uses tobacco and  denies any alcohol use.   ALLERGIES:  NO KNOWN MEDICAL ALLERGIES.   MEDICATIONS:  1. Neurontin.  2. Lipitor.  3. Avandia.  4. Lantus.  5. Lisinopril.  6. Klor-Con.  7. Albuterol.  8. Trazodone.  9. Cymbalta.  10.Vitamin B12.   11.Depakote.  12.Fexofenadine.  13.Promethazine.  14.Aspirin.  15.Lovenox.  16.Hydrochlorothiazide.  17.Loratadine.  18.Benicar.  19.Protonix.  20.Zosyn.  21.Vancomycin.   REVIEW OF SYSTEMS:  Review of systems is positive for a fever, chills,  nausea, and back pain.  He denies chest pain, shortness of breath, or  any other significant issues.   PHYSICAL EXAM:  VITAL SIGNS:  Temperature 99.8 (T-max is 101.4).  Pulse  88.  Respirations 20.  Blood pressure 122/67.  CONSTITUTIONAL:  He is a well-developed, well-nourished, white male in  no acute distress.  Alert and oriented.  HEENT:  Normocephalic, atraumatic.  NECK:  Supple.  HEART:  Regular rate and rhythm.  No murmurs, rubs, or gallops.  LUNGS:  Bilateral breath sounds clear to auscultation.  ABDOMEN:  Soft with mild diffuse tenderness.  Positive bowel sounds x4.  No CVA tenderness.  GU:  Circumcised penis with normal meatus.  Bilateral descended testes.  No perineal pain or tenderness with palpation.  No mass or lesions  palpated.  RECTAL:  Deferred.  EXTREMITIES:  Warm, dry.  No ankle edema.  Positive pedal pulses.  No  atrophy.  NEURO:  Grossly intact.   LABORATORY DATA:  Urinalysis showed color of urine is yellow, appearance  is clear, specific gravity 1.024, pH is 8, glucose greater than 1000,  ketones negative, bilirubin negative, blood small, protein 100, nitrite  negative, leukocytes negative.  Urine micro, WBCs 11 to 20, RBCs 0 to 2.   Urine culture pending.   Blood cultures, gram-positive cocci in clusters.   Sodium 129, potassium 4.7, chloride 97, CO2 of 25, BUN 29, creatinine  1.47, glucose 302.   WBC 8.6, hemoglobin 8.8, hematocrit 25.8, platelets 174.   CT pelvis with contrast showed prostatic enlargement with approximately  8-mm prostate abscess.   IMPRESSION/PLAN:  1. Acute cystitis with urine culture pending and positive blood      cultures.  2. Early formation of prostate abscess.   CT of  pelvis is consistent with early prostatic abscess formation  (approximately 18 mm).  We are recommending continuing with intravenous  antibiotic therapy for treatment of a urinary tract infection and  prostatic abscess.  We will recheck in approximately 24 hours for  response to antibiotic therapy.  If little to no response with  medications,  may require transrectal ultrasound-guided imaging for drainage of  abscess.  May also start Flomax once a day for urinary symptoms that may  be associated with an enlarged prostate.  Dr. Isabel Caprice will see this  patient.  Thank you for this consultation.      Delia Chimes, NP      Valetta Fuller, M.D.  Electronically Signed    MA/MEDQ  D:  02/08/2009  T:  02/08/2009  Job:  [id number]

## 2011-04-10 NOTE — Discharge Summary (Signed)
NAME:  Caleb Joseph, Caleb Joseph NO.:  [id number]   MEDICAL RECORD NO.:  [id number]          PATIENT TYPE:  INP   LOCATION:  1311                         FACILITY:  [facility]   PHYSICIAN:  Theodosia Paling, MD    DATE OF BIRTH:  [birth year]   DATE OF ADMISSION:  02/06/2009  DATE OF DISCHARGE:  02/15/2009                               DISCHARGE SUMMARY   See primary care physician [facility].   ADMITTING HISTORY:  Please refer to the admission note dictated by Dr.  Lonia Blood and history of present illness.   DISCHARGE DIAGNOSES:  1. Prostate abscess.  2. Methicillin resistant staph aureus bacteremia secondary to #1.  3. Hyperglycemia.  4. History of diabetes.  5. History of bipolar disorder.  6. History of hypertension.  7. History of dyslipidemia.  8. History of seasonal allergies.   DISCHARGE MEDICATIONS:  New medication added:  Bactrim Double Strength 1  tab p.o. q.12 h. for 4 weeks.  Medication adjusted: Lantus insulin 12 units subcu q.h.s.  Home medications to continue:  1. Neurontin 600 mg p.o. q.h.s.  2. Lipitor 20 mg p.o. daily.  3. Avandia 8 mg p.o. daily.  4. Lisinopril 40 mg p.o. daily.  5. Klor-Con 10 mEq p.o. q.12 h.  6. Albuterol 2 puffs p.o. q.6 h. p.r.n.  7. Trazodone 50 mg p.o. q.h.s.  8. Cymbalta 30 mg p.o. q.h.s.  9. Vitamin B12 1000 mcg p.o. daily.  10.Depakote Extended Release 500 mg every night.  11.Fexofenadine 808 mg p.o. daily.   HOSPITAL COURSE:  The following issues were addressed during the  hospitalization:  1. Prostatic abscess.  The patient presented with the signs and      symptoms suggested of UTI versus prostatitis.  CT scan was      performed which suspected prostatitis.  Patient was initially      started on IV Zosyn, however, new drawn blood culture was positive      for MRSA, therefore it was switched over to vancomycin.  Patient      received vanc during hospitalization.  MRSA was sensitive to      Bactrim.  Therefore,  patient will be needing at least 4 weeks of      p.o. Bactrim to prevent development into chronic prostatitis.  The      patient will be following up with Urology as an outpatient, Dr.      Isabel Caprice as an outpatient with follow up CT scan to confirm the      resolution of prostatic abscess.  2. Diabetes mellitus.  The patient's Lantus insulin was adjusted due      to hypoglycemia which would be the result of infection.  Currently,      he is getting discharged on 12 units of subcu Lantus insulin.  We      have requested social worker set up on outpatient for help with      follow up of his diabetes management to adjust insulin accordingly.  3. History of bipolar disorder.  Home medications to continue.  He did  not have any exacerbation of bipolar disorder.  4. Hypertension.  Lisinopril is continued.  He is hemodynamically      stable.   CONSULTATION PERFORMED:  Dr. Isabel Caprice of Urology on 02/08/2009 for  prostatic abscess.   PROCEDURES PERFORMED:  None.   IMAGING PERFORMED:  CT scan of the pelvis without contrast showing  prostatic enlargement with 1.9 cm oval lower attenuation focus in the  anterior aspect of the prostate gland just to the right of the midline  suspicious for prostatic abscess.   Total time spent on discharge of this patient:  45 minutes.      Theodosia Paling, MD  Electronically Signed     NP/MEDQ  D:  02/15/2009  T:  02/15/2009  Job:  [id number]

## 2011-04-13 NOTE — Discharge Summary (Signed)
NAMEDYLON, CORREA NO.:  [id number]   MEDICAL RECORD NO.:  [id number]          PATIENT TYPE:  INP   LOCATION:  6711                         FACILITY:  [facility]   PHYSICIAN:  Geoffry Paradise, M.D.  DATE OF BIRTH:  04/15/1956   DATE OF ADMISSION:  12/10/2004  DATE OF DISCHARGE:  12/20/2004                                 DISCHARGE SUMMARY   DISCHARGE DIAGNOSES:  1.  Diabetic right foot infection status post incision and drainage.  2.  Diabetes mellitus type 2, poor control.  3.  Diabetic peripheral neuropathy.  4.  Essential hypertension.  5.  Hyperlipidemia.   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  He has a long-standing  history of difficult compliance and difficult control, albeit in the recent  months his A1C has been in the 8 range.  He relates that he had had a small  blister on the outside of his foot in October and paid little attention to  it.  This subsequently ulcerated, began draining, and had increasing pain as  well as fever up to 101.7.  He had not taken any of his pills in recent days  and did not seem terribly concerned about this or the seriousness of his  foot infection.  He is admitted at this time for IV antibiotics and  orthopedic consultation for possible incision and drainage.  For details,  see the dictated summary on the chart.   DATA:  CBC:  Hemoglobin 12.6, hematocrit 37, white blood cell count 13.9,  platelet count 243,000.  Chemistries:  Sodium 139, potassium 3, chloride  104, CO2 25, glucose 198, BUN 8, creatinine 0.9, calcium 8.2.  Prior to  discharge INR is 1.6.  Chemistries:  Sodium 144, potassium 3.6, chloride  106, CO2 30, glucose 103, BUN 7, creatinine 0.6, calcium 8.4.  Blood  cultures:   No growth.  Wound culture:  Multiple organisms.  Foot:  Prominent  soft tissue swelling involved in the right foot with no findings definite  for osteomyelitis.   HOSPITAL COURSE:  Patient was admitted, placed on IV antibiotics and both  infectious disease and Dr. Darrelyn Hillock with orthopedic surgery were consulted.  On January 17 patient underwent incisions and drainage of deep-seeded  infection in the right foot.  All tissue was debrided and subsequently  packed.  Patient underwent hydrotherapy thereafter.  Infectious disease was  consulted for duration of therapy and spectrum of antibiotic therapy.  They  recommended continuing the vancomycin and Unasyn pending cultures with  subsequent conversion to Augmentin which was done.  Patient subsequently  defervesced, was placed on Coumadin for DVT prophylaxis postoperatively.  His increase was subsequently adjusted.  CBGs stabilized nicely and he is  stable at this time.  We did have discussions with him regarding compliance,  potential complications, and ramifications of poor control.  This seemed  marginally impacting, at  best.  Patient is discharged at this time to follow  up with home health for wound care, orthopedic surgery for the wound, and  Dr. Jacky Kindle for further medical follow-up.   DISCHARGE MEDICATIONS:  1.  Lantus insulin 90 units q.h.s. as before.  2.  Avandia 8 mg daily.  3.  Lipitor 20 mg daily.  4.  Lisinopril 20 mg daily.  5.  Neurontin 300 mg q.h.s.  6.  Augmentin per infectious disease 875 p.o. b.i.d. x2 additional weeks.      RA/MEDQ  D:  12/20/2004  T:  12/20/2004  Job:  [id number]

## 2011-04-13 NOTE — H&P (Signed)
NAMETADEUSZ, STAHL NO.:  [id number]   MEDICAL RECORD NO.:  [id number]          PATIENT TYPE:  INP   LOCATION:  1823                         FACILITY:  [facility]   PHYSICIAN:  Gaspar Garbe, M.D.DATE OF BIRTH:  04-Sep-1956   DATE OF ADMISSION:  12/10/2004  DATE OF DISCHARGE:                                HISTORY & PHYSICAL   CHIEF COMPLAINT:  Diabetic foot ulcer.   HISTORY OF PRESENT ILLNESS:  The patient is a 55 year old white male with a  history of diabetes complicated by history of amputation and consistently  poor control who came into the emergency room (was brought in by some  friends who he lives with) because of complaints of high fevers and swelling  over his foot.  The patient indicated that he had a small blister on the  outside of his foot in October, but did not really pay much attention to it,  as far as coming in to see a medical doctor for this.  He had noticed that  there was an area of slight ulceration on which he had been putting  occasionally Band-Aids, occasionally Betadine, and occasionally alcohol.  The area has become more inflamed today, and with these fevers his friends  have brought him in.  He does not seem to be terribly concerned with it.  He  has noticed some drainage coming out of it over the past couple of days.  His temperature at home was 101.7.  He first noticed this on Thursday p.m.,  but did not seek medical attention on Friday, nor did he call our office.  The patient also indicates that he has not been taking his medications as  prescribed, than he has taken his Lantus in the past couple days, but  frequently misses it.  He has not been taking any of his pills.  He seems to  be relatively unconcerned with the seriousness of the wound on his foot, and  when confronted with the fact that he could lose his foot due to  osteomyelitis, he indicated that, quote, well, I guess I won't be able to  kick a football.   ALLERGIES:  No known drug allergies.   MEDICATIONS:  1.  Lantus 90 units subcutaneous q.h.s.  2.  Avandia 8 mg daily (does not take).  3.  Lipitor 20 mg daily (does not take).  4.  Lisinopril, dose unknown (not taking).  5.  Neurontin 300 mg q.h.s. (currently not taking).   PAST MEDICAL HISTORY:  1.  Diabetes mellitus type 2, historically poor control, diagnosed in      approximately 1992.  2.  Hypertension.  3.  Hyperlipidemia.  4.  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 Orson Slick in 2000.   SOCIAL HISTORY:  The patient lives in [place] with 2 friends names Mak  and Almira Coaster, who are at his bedside.  He is single.  No children.  He uses  snuff, but does not smoke.  He does not drink alcohol.  FAMILY HISTORY:  Father died at age 34; he was murdered.  Mother died at age  56; she fell into a coma.  He had 2 brothers - 1 who died of a pneumonia-  related illness; the other one is currently alive.  He does not have any  other relatives with diabetes or cancers.   REVIEW OF SYSTEMS:  The patient has some fevers and chills.  Denies any  sweats.  Denies any headaches.  Denies any particular chest pain, shortness  of breath, dyspnea on exertion.  Although he does state that his lower  abdomen is a little bit sore, he points mostly to the area of redness on his  foot, and has had some recent urinary frequency.  All other systems are  negative.   PHYSICAL EXAMINATION:  VITAL SIGNS:  Temperature 101.2, pulse 86,  respiratory rate 16, blood pressure 146/92, satting 96% on room air.  GENERAL:  In no acute distress.  HEENT:  Normocephalic and atraumatic.  Pupils equal, round and reactive to  light and accommodation.  Extraocular movements are intact.  ENT is within  normal limits.  NECK:  Supple.  No lymphadenopathy, JVD, or bruit.  HEART:  Regular rate and rhythm.  No murmurs, rubs, or gallops.  LUNGS:  Clear to auscultation  bilaterally.  ABDOMEN:  Soft, nontender.  Normoactive bowel sounds.  No hepatosplenomegaly  is appreciated.  EXTREMITIES:  The patient is missing the right great toe.  He has a 1.5 cm  ulcer at the lateral aspect of his fifth digit on the plantar surface.  This  was probed with a sterile probe, indicating that the ulcer reaches a total  of 4 cm.  This reaches all the way up to the base of his fourth and fifth  phalanges.  No pus is seen, although the patient has had exudate from it  prior.  NEUROLOGIC:  Oriented x3.  Cranial nerves II-XII are intact.  The patient  has decrease to light touch and pinprick in his lower extremities; this  would be expected.   LABORATORY DATA:  White count is currently still pending.  Hemoglobin 12.6,  hematocrit 37.  BUN and creatinine are 12 and 1.1 respectively.  The  potassium is slightly low at 3.0, and his sodium is normal at 139.  Glucose  is 207.   ASSESSMENT AND PLAN:  1.  Diabetic foot ulcer.  The rest of his CBC is pending, but he obviously      has an area of considerable erythema, which is approximately 6 x 9 cm in      size.  This was outlined in pen to watch for the progression of this.      Also, given the depth of the foot ulcer, I am somewhat concerned that he      has osteomyelitis.  Will obtain plain films of his foot, and will have      orthopedics see the patient in the morning.  At this time, I am starting      him on Unasyn 3 gm q.6h., and will continue to follow his progress.  I      have indicated to the patient that he is in danger of losing his foot,      and that IV antibiotics alone probably will not help, given the depth of      the probing.  I am not certain as to whether I can probe the bone, but I  cannot visualize the end, as the ulcer depth comes at quite an acute      angle with approximately 180 degrees.  The patient seems to be terrible      non-concerned with losing the rest of his foot.  WIll get orto involved      as debridement will be necessary.  2.  Diabetes mellitus type 2.  Will keep the patient on his 90 units of      Lantus a day, and check an A1C.  I do not find him to be highly      motivated with his treatment, although he indicates that there are some      financial barriers to him being able to afford his medications.  Given      the high cost of Avandia, I would rather him getting to goal on Lantus,      a once a day shot that is more affordable, that he could take, and may      consider monotherapy in the future.  3.  Hyperlipidemia.  Continue his Lipitor for now.  4.  Hypertension.  Will put him on lisinopril 10 mg daily, as his dose is      unknown, and titrate upward as necessary.  5.  Neuropathy.  Will continue his Neurontin.  6.  Hypokalemia.  Not exactly certain what the cause of this is, and his      potassium will likely drop more.  He was given insulin.  I am giving him      40 mEq tonight, and will recheck his potassium in the morning and      replace as necessary.      Rich   RWT/MEDQ  D:  12/11/2004  T:  12/11/2004  Job:  [id number]   cc:   Geoffry Paradise, M.D.  [address]  [place]  [place] [address]  Fax: [phone]

## 2011-04-13 NOTE — Op Note (Signed)
NAMEPASHA, BROAD NO.:  [id number]   MEDICAL RECORD NO.:  [id number]          PATIENT TYPE:  INP   LOCATION:  6711                         FACILITY:  [facility]   PHYSICIAN:  Georges Lynch. Gioffre, M.D.DATE OF BIRTH:  [birth year]   DATE OF PROCEDURE:  12/12/2004  DATE OF DISCHARGE:                                 OPERATIVE REPORT   PREOPERATIVE DIAGNOSIS:  Deep wound infection of the right foot.   POSTOPERATIVE DIAGNOSIS:  Deep wound infection of the right foot.   OPERATION PERFORMED:  Multiple incisions for incision and drainage of a deep  infection in his right foot.   SURGEON:  Georges Lynch. Darrelyn Hillock, M.D.   ASSISTANT:  Nurse.   ANESTHESIA:  General.   DESCRIPTION OF PROCEDURE:  Under general anesthesia, routine orthopedic  prepping and draping of the right foot was carried out.  Following this, I  excised the infected callus over the lateral aspect of the right fifth  metatarsal head.  There was some purulent material present.  I then opened  his foot deep in the plantar aspect as well as the dorsal aspect.  I  utilized a hemostat to spread the soft tissue and drain the foot.  I then  made a separate incision directly over his fifth toe and there was a  definite pus pocket there as well and I thoroughly debrided that and  irrigated that as well. A third incision was made more medially on the foot  and I was able to run a hemostat up under the incision site up under that  area as well.  Culture and sensitivities were taken and sent to the lab.  Note, I thoroughly irrigated out first with saline with antibiotic solution.  I then packed all three wounds open with Furacin gauze and sterile dressings  were applied.   PLAN:  I am going to go ahead and start him on vancomycin because he really  did not appear to be getting much of a response from his previous  antibiotic.       RAG/MEDQ  D:  12/12/2004  T:  12/12/2004  Job:  [id number]   cc:   Geoffry Paradise, M.D.  [address]  [place]  [place] [address]  Fax: [phone]

## 2011-04-13 NOTE — Consult Note (Signed)
[name], [name]:  [id number]   MEDICAL RECORD NO.:  [id number]          PATIENT TYPE:  INP   LOCATION:  6711                         FACILITY:  [facility]   PHYSICIAN:  Georges Lynch. Gioffre, M.D.DATE OF BIRTH:  [birth year]   DATE OF CONSULTATION:  12/12/2004  DATE OF DISCHARGE:                                   CONSULTATION   I was called in consultation to see Mr. Caleb Joseph for the first time on December 11, 2004. At that time, I was called to see him to evaluate an infection in  his right foot. Apparently, he is a diabetic with diabetic neuropathy, and  he has a foot ulcer since October of 2005. He said he developed a blister  over the lateral aspect of his left foot and treated this at home. He  eventually came into the hospital and was admitted because of a full blown  infection on the right. His past history is all well documented.   MEDICATIONS:  1.  Neurontin.  2.  Lisinopril.  3.  Zocor.  4.  Lasix.  5.  Avandia.   PAST MEDICAL HISTORY:  History of diabetes and hypertension.   PAST SURGICAL HISTORY:  He had a right toe amputation in 2000 by Dr. Lebron Conners.   PHYSICAL EXAMINATION:  From an orthopedic standpoint, he had an obvious  infected callous over the lateral aspect of the fifth metatarsal head with a  diffuse cellulitis. There was some purulent material extruding from the  wound site. The circulation appeared to be intact, but his foot was quite  swollen and edematous. Difficult to palpate good pulses on him. He had no  sign of any infection ascending into the calf.   STUDIES:  The x-rays failed to reveal any signs of any osteomyelitis in his  foot.   IMPRESSION:  Deep wound infection, right foot.   TREATMENT:  I elected to schedule him for surgery. I took him to surgery the  following day, and I will dictate that surgical note.       RAG/MEDQ  D:  12/12/2004  T:  12/12/2004  Job:  [id number]

## 2011-06-14 ENCOUNTER — Emergency Department (HOSPITAL_COMMUNITY): Payer: Self-pay

## 2011-06-14 ENCOUNTER — Inpatient Hospital Stay (HOSPITAL_COMMUNITY)
Admission: EM | Admit: 2011-06-14 | Discharge: 2011-06-16 | DRG: 392 | Disposition: A | Discharge status: Home / Self Care | Payer: Self-pay | Attending: Cardiology | Admitting: Cardiology

## 2011-06-14 DIAGNOSIS — E1142 Type 2 diabetes mellitus with diabetic polyneuropathy: Secondary | ICD-10-CM | POA: Diagnosis present

## 2011-06-14 DIAGNOSIS — R0789 Other chest pain: Secondary | ICD-10-CM | POA: Diagnosis present

## 2011-06-14 DIAGNOSIS — Z794 Long term (current) use of insulin: Secondary | ICD-10-CM

## 2011-06-14 DIAGNOSIS — I10 Essential (primary) hypertension: Secondary | ICD-10-CM | POA: Diagnosis present

## 2011-06-14 DIAGNOSIS — E1149 Type 2 diabetes mellitus with other diabetic neurological complication: Secondary | ICD-10-CM | POA: Diagnosis present

## 2011-06-14 DIAGNOSIS — E785 Hyperlipidemia, unspecified: Secondary | ICD-10-CM | POA: Diagnosis present

## 2011-06-14 DIAGNOSIS — F319 Bipolar disorder, unspecified: Secondary | ICD-10-CM | POA: Diagnosis present

## 2011-06-14 DIAGNOSIS — Z8614 Personal history of Methicillin resistant Staphylococcus aureus infection: Secondary | ICD-10-CM

## 2011-06-14 DIAGNOSIS — K219 Gastro-esophageal reflux disease without esophagitis: Principal | ICD-10-CM | POA: Diagnosis present

## 2011-06-14 DIAGNOSIS — F17200 Nicotine dependence, unspecified, uncomplicated: Secondary | ICD-10-CM | POA: Diagnosis present

## 2011-06-14 DIAGNOSIS — S98139A Complete traumatic amputation of one unspecified lesser toe, initial encounter: Secondary | ICD-10-CM

## 2011-06-14 LAB — URINE MICROSCOPIC-ADD ON

## 2011-06-14 LAB — BASIC METABOLIC PANEL
BUN: 20 mg/dL (ref 6–23)
CO2: 27 mEq/L (ref 19–32)
Chloride: 98 mEq/L (ref 96–112)
Glucose, Bld: 291 mg/dL — ABNORMAL HIGH (ref 70–99)
Potassium: 4.1 mEq/L (ref 3.5–5.1)
Sodium: 138 mEq/L (ref 135–145)

## 2011-06-14 LAB — GLUCOSE, CAPILLARY
Glucose-Capillary: 267 mg/dL — ABNORMAL HIGH (ref 70–99)
Glucose-Capillary: 110 mg/dL — ABNORMAL HIGH (ref 70–99)

## 2011-06-14 LAB — CBC
HCT: 37.9 % — ABNORMAL LOW (ref 39.0–52.0)
Hemoglobin: 13.8 g/dL (ref 13.0–17.0)
WBC: 6.6 10*3/uL (ref 4.0–10.5)

## 2011-06-14 LAB — URINALYSIS, ROUTINE W REFLEX MICROSCOPIC
Bilirubin Urine: NEGATIVE
Bilirubin Urine: NEGATIVE
Hgb urine dipstick: NEGATIVE
Hgb urine dipstick: NEGATIVE
Ketones, ur: 15 mg/dL — AB
Ketones, ur: NEGATIVE mg/dL
Nitrite: NEGATIVE
Nitrite: NEGATIVE
Protein, ur: NEGATIVE mg/dL
Specific Gravity, Urine: 1.031 — ABNORMAL HIGH (ref 1.005–1.030)
Urobilinogen, UA: 1 mg/dL (ref 0.0–1.0)
pH: 6 (ref 5.0–8.0)

## 2011-06-14 LAB — COMPREHENSIVE METABOLIC PANEL
ALT: 15 U/L (ref 0–53)
AST: 15 U/L (ref 0–37)
Albumin: 4.6 g/dL (ref 3.5–5.2)
Alkaline Phosphatase: 79 U/L (ref 39–117)
Potassium: 4.4 mEq/L (ref 3.5–5.1)
Sodium: 133 mEq/L — ABNORMAL LOW (ref 135–145)
Total Protein: 8.1 g/dL (ref 6.0–8.3)

## 2011-06-14 LAB — DIFFERENTIAL
Basophils Absolute: 0.1 10*3/uL (ref 0.0–0.1)
Lymphocytes Relative: 21 % (ref 12–46)
Lymphs Abs: 1.4 10*3/uL (ref 0.7–4.0)
Monocytes Absolute: 0.5 10*3/uL (ref 0.1–1.0)
Neutro Abs: 4.5 10*3/uL (ref 1.7–7.7)

## 2011-06-15 LAB — PRO B NATRIURETIC PEPTIDE: Pro B Natriuretic peptide (BNP): 111.2 pg/mL (ref 0–125)

## 2011-06-15 LAB — COMPREHENSIVE METABOLIC PANEL
ALT: 10 U/L (ref 0–53)
Albumin: 3.5 g/dL (ref 3.5–5.2)
Alkaline Phosphatase: 62 U/L (ref 39–117)
Calcium: 9 mg/dL (ref 8.4–10.5)
GFR calc Af Amer: >60 mL/min (ref >60)
Glucose, Bld: 151 mg/dL — ABNORMAL HIGH (ref 70–99)
Potassium: 3.7 mEq/L (ref 3.5–5.1)
Sodium: 138 mEq/L (ref 135–145)
Total Protein: 6.6 g/dL (ref 6.0–8.3)

## 2011-06-15 LAB — MAGNESIUM
Magnesium: 2.4 mg/dL (ref 1.5–2.5)
Magnesium: 2.4 mg/dL (ref 1.5–2.5)

## 2011-06-15 LAB — GLUCOSE, CAPILLARY
Glucose-Capillary: 172 mg/dL — ABNORMAL HIGH (ref 70–99)
Glucose-Capillary: 349 mg/dL — ABNORMAL HIGH (ref 70–99)

## 2011-06-15 LAB — CK TOTAL AND CKMB (NOT AT ARMC)
CK, MB: 12.3 ng/mL (ref 0.3–4.0)
Relative Index: 6.7 — ABNORMAL HIGH (ref 0.0–2.5)

## 2011-06-15 LAB — PROTIME-INR
INR: 1.03 (ref 0.00–1.49)
INR: 1.15 (ref 0.00–1.49)
Prothrombin Time: 13.7 seconds (ref 11.6–15.2)
Prothrombin Time: 14.9 seconds (ref 11.6–15.2)

## 2011-06-15 LAB — CARDIAC PANEL(CRET KIN+CKTOT+MB+TROPI)
CK, MB: 7.9 ng/mL (ref 0.3–4.0)
Relative Index: 4.6 — ABNORMAL HIGH (ref 0.0–2.5)
Total CK: 145 U/L (ref 7–232)

## 2011-06-15 LAB — CBC
HCT: 36.7 % — ABNORMAL LOW (ref 39.0–52.0)
Hemoglobin: 12.6 g/dL — ABNORMAL LOW (ref 13.0–17.0)
MCH: 30.1 pg (ref 26.0–34.0)
MCHC: 34.3 g/dL (ref 30.0–36.0)
RDW: 12.6 % (ref 11.5–15.5)

## 2011-06-15 LAB — MRSA PCR SCREENING: MRSA by PCR: NEGATIVE

## 2011-06-15 LAB — APTT
aPTT: 27 seconds (ref 24–37)
aPTT: 65 seconds — ABNORMAL HIGH (ref 24–37)

## 2011-06-15 LAB — LIPID PANEL
Cholesterol: 116 mg/dL (ref 0–200)
HDL: 30 mg/dL — ABNORMAL LOW (ref >39)
Total CHOL/HDL Ratio: 3.9 RATIO
VLDL: 14 mg/dL (ref 0–40)

## 2011-06-15 LAB — POCT ACTIVATED CLOTTING TIME: Activated Clotting Time: 133 seconds

## 2011-06-15 LAB — HEMOGLOBIN A1C: Hgb A1c MFr Bld: 12.6 % — ABNORMAL HIGH (ref <5.7)

## 2011-06-16 LAB — GLUCOSE, CAPILLARY: Glucose-Capillary: 185 mg/dL — ABNORMAL HIGH (ref 70–99)

## 2011-06-20 NOTE — Cardiovascular Report (Signed)
  NAME:  Caleb Joseph, Caleb Joseph NO.:  [id number]  MEDICAL RECORD NO.:  [id number]  LOCATION:                                 FACILITY:  PHYSICIAN:  Nanetta Batty, M.D.   DATE OF BIRTH:  [birth year]  DATE OF PROCEDURE: DATE OF DISCHARGE:                           CARDIAC CATHETERIZATION   [name] is a 55 year old gentleman with positive risk factors for ischemic heart disease including brittle diabetes, hypertension, dyslipidemia, and tobacco abuse.  He is admitted with unstable angina. Ruled out for myocardial infarction.  Has had right bundle-branch block. He presents now for diagnostic coronary arteriography to define his anatomy and rule out ischemic etiology.  PROCEDURE DESCRIPTION:  The patient was brought to the second floor [facility] cardiac cath lab in postabsorptive state.  He was then premedicated with p.o. Valium, IV Versed, and fentanyl.  His right groin was prepped and shaved in usual sterile fashion.  A 1% Xylocaine was used for local anesthesia.  A 5-French sheath was inserted into the right femoral artery using standard Seldinger technique.  A 5-French right and left Judkins diagnostic catheter as well as 5-French pigtail catheter were used for selective coronary angiography, left ventriculography respectively.  Visipaque dye was used for the entirety of case.  Retrograde aorta, left ventricular, and pullback pressures were recorded.  HEMODYNAMICS: 1. Aortic systolic pressure 118, diastolic pressure 75. 2. Ventricle systolic pressure 115, end-diastolic pressure 13. 3. Selective coronary angiography:     a.     Left main normal.     b.     LAD normal.     c.     Left circumflex normal.     d.     Right coronary artery was dominant and normal. 4. Left ventriculography; RAO left ventriculogram was performed using     25 mL of Visipaque dye at 12 mL per second.  The overall LVEF was     estimated approximately 45% with mild global  hypokinesia.  IMPRESSION:  [name] has essentially normal coronary arteries and mild left ventricular dysfunction probably related to hypertensive heart disease.  The etiology of his chest pain is still unclear.  He will be treated empirically with antireflux measures.  An ACT was measured and the sheath was removed.  Pressure was held in the groin to achieve hemostasis.  The patient left the lab in stable condition.     Nanetta Batty, M.D.     [name]  D:  06/15/2011  T:  06/16/2011  Job:  [id number]  cc:   Second Floor [facility]  Electronically Signed by Nanetta Batty M.D. on 06/20/2011 03:20:45 PM

## 2011-06-26 ENCOUNTER — Inpatient Hospital Stay (INDEPENDENT_AMBULATORY_CARE_PROVIDER_SITE_OTHER)
Admission: RE | Admit: 2011-06-26 | Discharge: 2011-06-26 | Disposition: A | Discharge status: Home / Self Care | Payer: Self-pay | Source: Ambulatory Visit | Attending: Emergency Medicine | Admitting: Emergency Medicine

## 2011-06-26 DIAGNOSIS — T148XXA Other injury of unspecified body region, initial encounter: Secondary | ICD-10-CM

## 2011-07-07 NOTE — Discharge Summary (Signed)
NAMEMarland Joseph  QUINTEL, MCCALLA NO.:  [id number]  MEDICAL RECORD NO.:  [id number]  LOCATION:  2003                         FACILITY:  [facility]  PHYSICIAN:  Landry Corporal, MD DATE OF BIRTH:  [birth year]  DATE OF ADMISSION:  06/14/2011 DATE OF DISCHARGE:  06/16/2011                              DISCHARGE SUMMARY   DISCHARGE DIAGNOSES: 1. Chest pain negative myocardial infarction.     a.     Patent coronary arteries.     b.     Impaired treatment with PPI for probable gastroesophageal      reflux disease. 2. Left ventricular dysfunction, ejection fraction 45%, global     hypokinesis. 3. 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 4. Dyslipidemia. 5. Bipolar disease. 6. Tobacco abuse.  DISCHARGE CONDITION:  Stable and improved.  DISCHARGE INSTRUCTIONS: 1. Activity as tolerated.  Increase activity slowly. 2. May shower.  No lifting for 2 days.  No driving for 2 days. 3. Heart healthy diabetic diet. 4. Wash cath site with soap and water.  Call if any bleeding,     swelling, or drainage. 5. Follow up with Dr. Allyson Sabal at [facility] vascular and the     office will call him with date and time.  Follow up with     [facility] concerning diabetes. 6. Stop smoking.  HOSPITAL COURSE:  A 55 year old white male with history of diabetes mellitus, insulin dependent, hypertension, dyslipidemia who presented to [facility] Emergency Room with complaints of shortness of breath and chest pain, was out working in the field and developed onset suddenly of chest pain and shortness of breath.  He could not take a deep breath. His legs became weak and numb.  He experienced nausea and vomiting.  He took nitroglycerin from a coworker and his pain was relieved and he has been pain free since.  The patient reported a 25-month history of shortness of breath with exertion, but nothing was as bad as the day of admission.   In the ER, sinus rhythm with a right bundle-branch block.  Initial CK-MB was 12.3 with normal troponin and CK.  He was admitted while he was pain free at that time.  PAST MEDICAL HISTORY: 1. Insulin-dependent diabetes mellitus, brittle. 2. Hypertension. 3. Dyslipidemia. 4. History of prostate abscess with MRSA. 5. 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 6. Diabetic neuropathy. 7. Bipolar disorder. 8. Depression. 9. Tobacco use including snuff, pipe and cigar.  The patient was admitted and plans for cardiac catheterization, which he underwent on June 15, 2011.  Fortunately, he had normal coronary arteries and EF was lower at 45% with mild global hypokinesis.  During the hospitalization, he had tobacco cessation consult and he stated he did not wish to quit tobacco at this time.  The patient also had diabetes consult with a diabetic coordinator.  He stated his hemoglobin A1c of 12.6 was actually good, at times it is up to 15.  Recommendations were reviewed with the patient.  He has a new medication to take from his primary care, but has not  yet started it but he stated he did regularly take his Lantus.  They encouraged more frequent CBG checks.  By June 16, 2011, the patient was stable and ambulating in the hall without complications.  His CK-MB were continued to be elevated, but he was working in a field, though the CK itself is not severely elevated, troponins are all negative.  Dr. Tresa Endo added and ACE inhibitor because of his decreased in EF as well as to protect his kidneys, so we added lisinopril 2.5 mg daily.  In the hospital, he had been on a beta-blocker,  but we stopped this at discharge since he has normal coronary arteries and he did get a bradycardic here in the hospital.  He was seen by Dr. Tresa Endo and was ready for discharge.  MEDICATION RECONCILIATION SHEET:  Please see current report.     Darcella Gasman. Annie Paras,  N.P.   ______________________________ Landry Corporal, MD    LRI/MEDQ  D:  06/16/2011  T:  06/16/2011  Job:  [id number]  cc:   Jonathon Bellows, M.D.  Electronically Signed by Nada Boozer N.P. on 06/17/2011 06:56:41 PM Electronically Signed by Bryan Lemma MD on 07/07/2011 12:26:07 AM

## 2011-07-09 ENCOUNTER — Inpatient Hospital Stay (HOSPITAL_COMMUNITY)
Admission: RE | Admit: 2011-07-09 | Discharge: 2011-07-09 | Disposition: A | Discharge status: Home / Self Care | Payer: Self-pay | Source: Ambulatory Visit | Attending: Family Medicine | Admitting: Family Medicine

## 2011-07-11 ENCOUNTER — Inpatient Hospital Stay (HOSPITAL_COMMUNITY)
Admission: RE | Admit: 2011-07-11 | Discharge: 2011-07-11 | Disposition: A | Discharge status: Home / Self Care | Payer: Self-pay | Source: Ambulatory Visit | Attending: Emergency Medicine | Admitting: Emergency Medicine

## 2011-11-18 ENCOUNTER — Emergency Department (HOSPITAL_COMMUNITY): Payer: Medicaid Other

## 2011-11-18 ENCOUNTER — Observation Stay (HOSPITAL_COMMUNITY)
Admission: EM | Admit: 2011-11-18 | Discharge: 2011-11-18 | Disposition: A | Discharge status: Home / Self Care | Payer: Medicaid Other | Attending: Emergency Medicine | Admitting: Emergency Medicine

## 2011-11-18 ENCOUNTER — Encounter: Payer: Self-pay | Admitting: *Deleted

## 2011-11-18 DIAGNOSIS — L03039 Cellulitis of unspecified toe: Principal | ICD-10-CM | POA: Insufficient documentation

## 2011-11-18 DIAGNOSIS — E1149 Type 2 diabetes mellitus with other diabetic neurological complication: Secondary | ICD-10-CM | POA: Insufficient documentation

## 2011-11-18 DIAGNOSIS — L0390 Cellulitis, unspecified: Secondary | ICD-10-CM

## 2011-11-18 DIAGNOSIS — L02619 Cutaneous abscess of unspecified foot: Principal | ICD-10-CM | POA: Insufficient documentation

## 2011-11-18 DIAGNOSIS — E1142 Type 2 diabetes mellitus with diabetic polyneuropathy: Secondary | ICD-10-CM | POA: Insufficient documentation

## 2011-11-18 LAB — BASIC METABOLIC PANEL
Calcium: 9.5 mg/dL (ref 8.4–10.5)
Chloride: 96 mEq/L (ref 96–112)
Creatinine, Ser: 0.83 mg/dL (ref 0.50–1.35)
GFR calc Af Amer: >90 mL/min (ref >90)
GFR calc non Af Amer: >90 mL/min (ref >90)

## 2011-11-18 LAB — CBC
MCHC: 34.8 g/dL (ref 30.0–36.0)
MCV: 88 fL (ref 78.0–100.0)
Platelets: 128 10*3/uL — ABNORMAL LOW (ref 150–400)
RDW: 12.6 % (ref 11.5–15.5)
WBC: 6.9 10*3/uL (ref 4.0–10.5)

## 2011-11-18 MED ORDER — MORPHINE SULFATE 4 MG/ML IJ SOLN: 4.0000 mg | Freq: Once | INTRAMUSCULAR | Status: DC

## 2011-11-18 MED ORDER — CIPROFLOXACIN HCL 500 MG PO TABS: 500.0000 mg | ORAL_TABLET | Freq: Two times a day (BID) | ORAL | Status: AC

## 2011-11-18 MED ORDER — ONDANSETRON HCL 4 MG/2ML IJ SOLN: 4.0000 mg | Freq: Four times a day (QID) | INTRAMUSCULAR | Status: DC | PRN

## 2011-11-18 MED ORDER — DOXYCYCLINE HYCLATE 100 MG PO TABS
100.0000 mg | ORAL_TABLET | Freq: Once | ORAL | Status: AC
  Administered 2011-11-18: 100 mg via ORAL
  Filled 2011-11-18: qty 1

## 2011-11-18 MED ORDER — DOXYCYCLINE HYCLATE 100 MG PO CAPS: 100.0000 mg | ORAL_CAPSULE | Freq: Two times a day (BID) | ORAL | Status: AC

## 2011-11-18 MED ORDER — ACETAMINOPHEN 325 MG PO TABS: 650.0000 mg | ORAL_TABLET | ORAL | Status: DC | PRN

## 2011-11-18 MED ORDER — CIPROFLOXACIN HCL 500 MG PO TABS
500.0000 mg | ORAL_TABLET | Freq: Once | ORAL | Status: AC
  Administered 2011-11-18: 500 mg via ORAL
  Filled 2011-11-18: qty 1

## 2011-11-18 MED ORDER — ONDANSETRON HCL 4 MG/2ML IJ SOLN: 4.0000 mg | Freq: Once | INTRAMUSCULAR | Status: DC

## 2011-11-18 MED ORDER — MORPHINE SULFATE 4 MG/ML IJ SOLN: 4.0000 mg | INTRAMUSCULAR | Status: DC | PRN

## 2011-11-18 MED ORDER — VANCOMYCIN HCL IN DEXTROSE 1-5 GM/200ML-% IV SOLN
1000.0000 mg | Freq: Once | INTRAVENOUS | Status: AC
  Administered 2011-11-18: 1000 mg via INTRAVENOUS
  Filled 2011-11-18: qty 200

## 2011-11-18 MED ORDER — VANCOMYCIN HCL IN DEXTROSE 1-5 GM/200ML-% IV SOLN: 1000.0000 mg | Freq: Two times a day (BID) | INTRAVENOUS | Status: DC

## 2011-11-18 MED ORDER — SODIUM CHLORIDE 0.9 % IV BOLUS (SEPSIS)
1000.0000 mL | Freq: Once | INTRAVENOUS | Status: AC
  Administered 2011-11-18: 1000 mL via INTRAVENOUS

## 2011-11-18 MED ORDER — SODIUM CHLORIDE 0.9 % IV SOLN
999.0000 mL | Freq: Once | INTRAVENOUS | Status: AC
  Administered 2011-11-18: 1000 mL via INTRAVENOUS

## 2011-11-18 MED ORDER — SODIUM CHLORIDE 0.9 % IV SOLN
Freq: Once | INTRAVENOUS | Status: AC
  Administered 2011-11-18: 16:00:00 via INTRAVENOUS

## 2011-11-18 MED ORDER — ZOLPIDEM TARTRATE 5 MG PO TABS: 5.0000 mg | ORAL_TABLET | Freq: Every evening | ORAL | Status: DC | PRN

## 2011-11-18 MED ORDER — CLINDAMYCIN PHOSPHATE 600 MG/50ML IV SOLN
600.0000 mg | Freq: Once | INTRAVENOUS | Status: AC
  Administered 2011-11-18: 600 mg via INTRAVENOUS
  Filled 2011-11-18 (×2): qty 50

## 2011-11-18 NOTE — ED Provider Notes (Signed)
History     CSN: [id number]  Arrival date & time 11/18/11  1017   First MD Initiated Contact with Patient 11/18/11 1056      Chief Complaint  Patient presents with  . Foot Pain    (Consider location/radiation/quality/duration/timing/severity/associated sxs/prior treatment) The history is provided by the patient.    Pt presents to the Emergency Department from home with complaints of toe pain. Pt is a diabetic with Neuropathy and stepped on a nail into his left Great Toe on Wednesday. He is unsure of how long the nail was in his toe. He was not seen after the incident. He noticed it started to get infected on Thursday when he started having severe pain and chills, feverish. He states that since then it has only started to get worse and he feels that it is started to go up on the the top side of his foot towards his ankle and it hurts when he walks.  Past Medical History  Diagnosis Date  . Diabetes mellitus     Past Surgical History  Procedure Date  . Toe amputation     History reviewed. No pertinent family history.  History  Substance Use Topics  . Smoking status: Not on file  . Smokeless tobacco: Not on file  . Alcohol Use:       Review of Systems  All other systems reviewed and are negative.    Allergies  Review of patient's allergies indicates no known allergies.  Home Medications   Current Outpatient Rx  Name Route Sig Dispense Refill  . ALBUTEROL SULFATE HFA 108 (90 BASE) MCG/ACT IN AERS Inhalation Inhale 2 puffs into the lungs every 6 (six) hours as needed. For cold symptoms     . LIPITOR PO Oral Take 1 tablet by mouth daily.      Marland Kitchen DEPAKOTE PO Oral Take by mouth.      . DULOXETINE HCL 30 MG PO CPEP Oral Take 30 mg by mouth daily.      Marland Kitchen GABAPENTIN 300 MG PO CAPS Oral Take 600 mg by mouth daily.      . INSULIN GLARGINE 100 UNIT/ML [place] SOLN Subcutaneous Inject 60 Units into the skin at bedtime.      . OMEGA-3-ACID ETHYL ESTERS 1 G PO CAPS Oral Take 2 g  by mouth 2 (two) times daily.      . ACTOS PO Oral Take 1 tablet by mouth daily.      Marland Kitchen PRESCRIPTION MEDICATION Oral Take 1 tablet by mouth daily. For blood pressure       BP 155/76  Pulse 88  Temp(Src) 98.1 F (36.7 C) (Oral)  Resp 20  SpO2 100%  Physical Exam  Nursing note and vitals reviewed. Constitutional: He is oriented to person, place, and time. He appears well-developed and well-nourished.  HENT:  Head: Normocephalic and atraumatic.  Eyes: Pupils are equal, round, and reactive to light.  Neck: Normal range of motion.  Cardiovascular: Normal rate and regular rhythm.   Pulmonary/Chest: Effort normal and breath sounds normal.  Musculoskeletal: Normal range of motion.       Feet:  Neurological: He is alert and oriented to person, place, and time.  Skin: Skin is warm and dry.    ED Course  Procedures (including critical care time)  Labs Reviewed  CBC - Abnormal; Notable for the following:    RBC 3.92 (*)    Hemoglobin 12.0 (*)    HCT 34.5 (*)    Platelets 128 (*)  All other components within normal limits  BASIC METABOLIC PANEL - Abnormal; Notable for the following:    Sodium 132 (*)    Glucose, Bld 289 (*)    All other components within normal limits  WOUND CULTURE  CULTURE, BLOOD (ROUTINE X 2)  CULTURE, BLOOD (ROUTINE X 2)   Dg Toe Great Left  11/18/2011  *RADIOLOGY REPORT*  Clinical Data: Swelling post puncture wound  LEFT TOE - 2+ VIEW  Comparison: None.  Findings: No radiodense foreign body or subcutaneous gas.  No focal cortical destruction.  Patchy arterial calcifications. Negative for fracture, dislocation, or other acute abnormality.  Normal alignment and mineralization. No significant degenerative change. Regional soft tissues unremarkable.  IMPRESSION:  Negative  Original Report Authenticated By: Thora Lance III, M.D.     1. Cellulitis       MDM  Pt is to be on Cellulitis Protocol to get IV  Vancomycin. If significant improvement, pt  can be D/C'd with close follow-up with Dr. Alden Benjamin. If not, pt may need to be admitted.        Dorthula Matas, PA 11/18/11 1235  Dorthula Matas, [place] 11/19/11 [phone]

## 2011-11-18 NOTE — ED Provider Notes (Signed)
Patient has completed initial antibiotic.  On reassessment, patient states discomfort in foot has subsided.  Redness is not extending beyond marked borders.  Patient would like to be discharged home.  Discussed patient with Dr. Bertis Ruddy d/c with cipro and doxy, have patient return for recheck in 24 hours.  Jimmye Norman, NP 11/18/11 1946

## 2011-11-18 NOTE — ED Notes (Signed)
Meal tray ordered - CHO modified.

## 2011-11-18 NOTE — ED Notes (Signed)
Pt NAD, resp e/u, AOx4 with steady ambulatory gait at time of discharge. Pt states understanding of discharge instructions and denies questions.

## 2011-11-18 NOTE — ED Provider Notes (Signed)
Medical screening examination/treatment/procedure(s) were conducted as a shared visit with non-physician practitioner(s) and myself.  I personally evaluated the patient during the encounter  Patient seen by me, left great toe with significant cellulitis. Treated with clindamycin we'll add in vancomycin as well. Patient will remove to CDU under cellulitis protocol. If toe does not respond to CDU treatment will require formal admission.      Shelda Jakes, MD 11/18/11 1250

## 2011-11-18 NOTE — ED Notes (Signed)
Vancomycin infusion completed.

## 2011-11-18 NOTE — ED Notes (Signed)
Pt advised last Td was last year.

## 2011-11-18 NOTE — ED Notes (Signed)
Caleb Morn, NP, at bedside assessed pt's left foot/toe.

## 2011-11-18 NOTE — ED Notes (Signed)
Pt states he stepped on a nail 3 days ago now redness and pain is radiating up left leg. Pt has had toes amputated by dr supple in past due to infection

## 2011-11-19 NOTE — ED Provider Notes (Signed)
Medical screening examination/treatment/procedure(s) were performed by non-physician practitioner and as supervising physician I was immediately available for consultation/collaboration.   Lyanne Co, MD 11/19/11 [phone]

## 2011-12-26 ENCOUNTER — Emergency Department (HOSPITAL_COMMUNITY): Payer: Medicaid Other

## 2011-12-26 ENCOUNTER — Encounter (HOSPITAL_COMMUNITY): Payer: Self-pay | Admitting: *Deleted

## 2011-12-26 ENCOUNTER — Inpatient Hospital Stay (HOSPITAL_COMMUNITY)
Admission: EM | Admit: 2011-12-26 | Discharge: 2012-01-01 | DRG: 617 | Disposition: A | Discharge status: Home / Self Care | Payer: Medicaid Other | Attending: Internal Medicine | Admitting: Internal Medicine

## 2011-12-26 DIAGNOSIS — E11311 Type 2 diabetes mellitus with unspecified diabetic retinopathy with macular edema: Secondary | ICD-10-CM

## 2011-12-26 DIAGNOSIS — F411 Generalized anxiety disorder: Secondary | ICD-10-CM | POA: Diagnosis present

## 2011-12-26 DIAGNOSIS — J209 Acute bronchitis, unspecified: Secondary | ICD-10-CM

## 2011-12-26 DIAGNOSIS — R609 Edema, unspecified: Secondary | ICD-10-CM

## 2011-12-26 DIAGNOSIS — E785 Hyperlipidemia, unspecified: Secondary | ICD-10-CM

## 2011-12-26 DIAGNOSIS — Z8619 Personal history of other infectious and parasitic diseases: Secondary | ICD-10-CM

## 2011-12-26 DIAGNOSIS — I959 Hypotension, unspecified: Secondary | ICD-10-CM

## 2011-12-26 DIAGNOSIS — L97909 Non-pressure chronic ulcer of unspecified part of unspecified lower leg with unspecified severity: Secondary | ICD-10-CM

## 2011-12-26 DIAGNOSIS — R0602 Shortness of breath: Secondary | ICD-10-CM

## 2011-12-26 DIAGNOSIS — M9080 Osteopathy in diseases classified elsewhere, unspecified site: Secondary | ICD-10-CM | POA: Diagnosis present

## 2011-12-26 DIAGNOSIS — E875 Hyperkalemia: Secondary | ICD-10-CM

## 2011-12-26 DIAGNOSIS — E1159 Type 2 diabetes mellitus with other circulatory complications: Secondary | ICD-10-CM

## 2011-12-26 DIAGNOSIS — L089 Local infection of the skin and subcutaneous tissue, unspecified: Secondary | ICD-10-CM

## 2011-12-26 DIAGNOSIS — E860 Dehydration: Secondary | ICD-10-CM

## 2011-12-26 DIAGNOSIS — M869 Osteomyelitis, unspecified: Secondary | ICD-10-CM

## 2011-12-26 DIAGNOSIS — Z87440 Personal history of urinary (tract) infections: Secondary | ICD-10-CM

## 2011-12-26 DIAGNOSIS — D518 Other vitamin B12 deficiency anemias: Secondary | ICD-10-CM

## 2011-12-26 DIAGNOSIS — Z794 Long term (current) use of insulin: Secondary | ICD-10-CM

## 2011-12-26 DIAGNOSIS — D638 Anemia in other chronic diseases classified elsewhere: Secondary | ICD-10-CM | POA: Diagnosis present

## 2011-12-26 DIAGNOSIS — R634 Abnormal weight loss: Secondary | ICD-10-CM

## 2011-12-26 DIAGNOSIS — R109 Unspecified abdominal pain: Secondary | ICD-10-CM

## 2011-12-26 DIAGNOSIS — E11628 Type 2 diabetes mellitus with other skin complications: Secondary | ICD-10-CM

## 2011-12-26 DIAGNOSIS — B9789 Other viral agents as the cause of diseases classified elsewhere: Secondary | ICD-10-CM

## 2011-12-26 DIAGNOSIS — E1142 Type 2 diabetes mellitus with diabetic polyneuropathy: Secondary | ICD-10-CM | POA: Diagnosis present

## 2011-12-26 DIAGNOSIS — E1140 Type 2 diabetes mellitus with diabetic neuropathy, unspecified: Secondary | ICD-10-CM | POA: Diagnosis present

## 2011-12-26 DIAGNOSIS — N412 Abscess of prostate: Secondary | ICD-10-CM

## 2011-12-26 DIAGNOSIS — D649 Anemia, unspecified: Secondary | ICD-10-CM | POA: Diagnosis present

## 2011-12-26 DIAGNOSIS — R6510 Systemic inflammatory response syndrome (SIRS) of non-infectious origin without acute organ dysfunction: Secondary | ICD-10-CM | POA: Diagnosis present

## 2011-12-26 DIAGNOSIS — I951 Orthostatic hypotension: Secondary | ICD-10-CM

## 2011-12-26 DIAGNOSIS — F329 Major depressive disorder, single episode, unspecified: Secondary | ICD-10-CM

## 2011-12-26 DIAGNOSIS — A4901 Methicillin susceptible Staphylococcus aureus infection, unspecified site: Secondary | ICD-10-CM

## 2011-12-26 DIAGNOSIS — N4611 Organic oligospermia: Secondary | ICD-10-CM

## 2011-12-26 DIAGNOSIS — I739 Peripheral vascular disease, unspecified: Secondary | ICD-10-CM

## 2011-12-26 DIAGNOSIS — Z833 Family history of diabetes mellitus: Secondary | ICD-10-CM

## 2011-12-26 DIAGNOSIS — R5381 Other malaise: Secondary | ICD-10-CM

## 2011-12-26 DIAGNOSIS — R079 Chest pain, unspecified: Secondary | ICD-10-CM

## 2011-12-26 DIAGNOSIS — R350 Frequency of micturition: Secondary | ICD-10-CM

## 2011-12-26 DIAGNOSIS — N390 Urinary tract infection, site not specified: Secondary | ICD-10-CM

## 2011-12-26 DIAGNOSIS — I10 Essential (primary) hypertension: Secondary | ICD-10-CM

## 2011-12-26 DIAGNOSIS — E1169 Type 2 diabetes mellitus with other specified complication: Principal | ICD-10-CM | POA: Diagnosis present

## 2011-12-26 DIAGNOSIS — R55 Syncope and collapse: Secondary | ICD-10-CM

## 2011-12-26 DIAGNOSIS — K029 Dental caries, unspecified: Secondary | ICD-10-CM

## 2011-12-26 DIAGNOSIS — E1149 Type 2 diabetes mellitus with other diabetic neurological complication: Secondary | ICD-10-CM

## 2011-12-26 DIAGNOSIS — N179 Acute kidney failure, unspecified: Secondary | ICD-10-CM

## 2011-12-26 DIAGNOSIS — E1101 Type 2 diabetes mellitus with hyperosmolarity with coma: Secondary | ICD-10-CM

## 2011-12-26 DIAGNOSIS — N508 Other specified disorders of male genital organs: Secondary | ICD-10-CM

## 2011-12-26 DIAGNOSIS — F3289 Other specified depressive episodes: Secondary | ICD-10-CM

## 2011-12-26 DIAGNOSIS — E876 Hypokalemia: Secondary | ICD-10-CM

## 2011-12-26 DIAGNOSIS — A4102 Sepsis due to Methicillin resistant Staphylococcus aureus: Secondary | ICD-10-CM

## 2011-12-26 DIAGNOSIS — F458 Other somatoform disorders: Secondary | ICD-10-CM

## 2011-12-26 DIAGNOSIS — G5620 Lesion of ulnar nerve, unspecified upper limb: Secondary | ICD-10-CM

## 2011-12-26 DIAGNOSIS — R9431 Abnormal electrocardiogram [ECG] [EKG]: Secondary | ICD-10-CM

## 2011-12-26 HISTORY — DX: Hyperlipidemia, unspecified: E78.5

## 2011-12-26 HISTORY — DX: Type 2 diabetes mellitus with other skin complications: E11.628

## 2011-12-26 HISTORY — DX: Local infection of the skin and subcutaneous tissue, unspecified: L08.9

## 2011-12-26 HISTORY — DX: Local infection of the skin and subcutaneous tissue, unspecified: E11.628

## 2011-12-26 HISTORY — DX: Sepsis due to methicillin resistant Staphylococcus aureus: A41.02

## 2011-12-26 HISTORY — DX: Type 2 diabetes mellitus with other diabetic neurological complication: E11.49

## 2011-12-26 LAB — DIFFERENTIAL
Basophils Absolute: 0 10*3/uL (ref 0.0–0.1)
Basophils Relative: 0 % (ref 0–1)
Eosinophils Absolute: 0.1 10*3/uL (ref 0.0–0.7)
Neutro Abs: 4.1 10*3/uL (ref 1.7–7.7)
Neutrophils Relative %: 67 % (ref 43–77)

## 2011-12-26 LAB — CBC
HCT: 29.4 % — ABNORMAL LOW (ref 39.0–52.0)
Hemoglobin: 9.8 g/dL — ABNORMAL LOW (ref 13.0–17.0)
MCH: 30.1 pg (ref 26.0–34.0)
MCH: 30.1 pg (ref 26.0–34.0)
MCHC: 34.3 g/dL (ref 30.0–36.0)
MCV: 87.8 fL (ref 78.0–100.0)
Platelets: 132 10*3/uL — ABNORMAL LOW (ref 150–400)
RBC: 3.35 MIL/uL — ABNORMAL LOW (ref 4.22–5.81)
RDW: 13.1 % (ref 11.5–15.5)
RDW: 13.1 % (ref 11.5–15.5)
WBC: 6.9 10*3/uL (ref 4.0–10.5)

## 2011-12-26 LAB — COMPREHENSIVE METABOLIC PANEL
AST: 14 U/L (ref 0–37)
Albumin: 3.2 g/dL — ABNORMAL LOW (ref 3.5–5.2)
Alkaline Phosphatase: 61 U/L (ref 39–117)
Chloride: 97 mEq/L (ref 96–112)
Potassium: 5.7 mEq/L — ABNORMAL HIGH (ref 3.5–5.1)
Sodium: 132 mEq/L — ABNORMAL LOW (ref 135–145)
Total Bilirubin: 0.7 mg/dL (ref 0.3–1.2)
Total Protein: 7 g/dL (ref 6.0–8.3)

## 2011-12-26 LAB — LACTIC ACID, PLASMA: Lactic Acid, Venous: 1.2 mmol/L (ref 0.5–2.2)

## 2011-12-26 LAB — CREATININE, SERUM: GFR calc Af Amer: 57 mL/min — ABNORMAL LOW (ref >90)

## 2011-12-26 LAB — HEMOGLOBIN A1C: Hgb A1c MFr Bld: 11.7 % — ABNORMAL HIGH (ref <5.7)

## 2011-12-26 LAB — PROTIME-INR: Prothrombin Time: 15 seconds (ref 11.6–15.2)

## 2011-12-26 LAB — GLUCOSE, CAPILLARY: Glucose-Capillary: 81 mg/dL (ref 70–99)

## 2011-12-26 LAB — MRSA PCR SCREENING: MRSA by PCR: NEGATIVE

## 2011-12-26 MED ORDER — SODIUM CHLORIDE 0.9 % IV SOLN: INTRAVENOUS | Status: AC

## 2011-12-26 MED ORDER — VANCOMYCIN HCL IN DEXTROSE 1-5 GM/200ML-% IV SOLN
1000.0000 mg | Freq: Once | INTRAVENOUS | Status: AC
  Administered 2011-12-26: 1000 mg via INTRAVENOUS
  Filled 2011-12-26: qty 200

## 2011-12-26 MED ORDER — ONDANSETRON HCL 4 MG PO TABS: 4.0000 mg | ORAL_TABLET | Freq: Four times a day (QID) | ORAL | Status: DC | PRN

## 2011-12-26 MED ORDER — SODIUM POLYSTYRENE SULFONATE 15 GM/60ML PO SUSP
45.0000 g | Freq: Once | ORAL | Status: AC
  Administered 2011-12-26: 45 g via ORAL
  Filled 2011-12-26: qty 180

## 2011-12-26 MED ORDER — DULOXETINE HCL 30 MG PO CPEP
30.0000 mg | ORAL_CAPSULE | Freq: Every day | ORAL | Status: DC
  Administered 2011-12-26 – 2012-01-01 (×7): 30 mg via ORAL
  Filled 2011-12-26 (×7): qty 1

## 2011-12-26 MED ORDER — SIMVASTATIN 20 MG PO TABS
20.0000 mg | ORAL_TABLET | Freq: Every day | ORAL | Status: DC
Start: 2011-12-26 — End: 2011-12-27
  Administered 2011-12-27: 20 mg via ORAL
  Filled 2011-12-26 (×2): qty 1

## 2011-12-26 MED ORDER — SODIUM CHLORIDE 0.9 % IV SOLN
1500.0000 mg | INTRAVENOUS | Status: DC
  Filled 2011-12-26: qty 1500

## 2011-12-26 MED ORDER — SODIUM CHLORIDE 0.9 % IV SOLN
1.0000 g | Freq: Two times a day (BID) | INTRAVENOUS | Status: DC
  Administered 2011-12-26 – 2011-12-27 (×2): 1 g via INTRAVENOUS
  Filled 2011-12-26 (×4): qty 1

## 2011-12-26 MED ORDER — GABAPENTIN 300 MG PO CAPS
600.0000 mg | ORAL_CAPSULE | Freq: Every day | ORAL | Status: DC
  Administered 2011-12-26 – 2012-01-01 (×7): 600 mg via ORAL
  Filled 2011-12-26 (×7): qty 2

## 2011-12-26 MED ORDER — HYDROCODONE-ACETAMINOPHEN 5-325 MG PO TABS
1.0000 | ORAL_TABLET | ORAL | Status: DC | PRN
  Administered 2011-12-26 – 2011-12-27 (×2): 2 via ORAL
  Administered 2011-12-30 – 2011-12-31 (×4): 1 via ORAL
  Filled 2011-12-26 (×3): qty 1
  Filled 2011-12-26: qty 2
  Filled 2011-12-26: qty 1
  Filled 2011-12-26 (×2): qty 2

## 2011-12-26 MED ORDER — INSULIN ASPART 100 UNIT/ML ~~LOC~~ SOLN: 0.0000 [IU] | Freq: Every day | SUBCUTANEOUS | Status: DC

## 2011-12-26 MED ORDER — SODIUM CHLORIDE 0.9 % IV SOLN
INTRAVENOUS | Status: DC
  Administered 2011-12-26 – 2011-12-27 (×2): via INTRAVENOUS

## 2011-12-26 MED ORDER — VITAMIN D3 25 MCG (1000 UNIT) PO TABS
1000.0000 [IU] | ORAL_TABLET | Freq: Two times a day (BID) | ORAL | Status: DC
  Administered 2011-12-26 – 2012-01-01 (×12): 1000 [IU] via ORAL
  Filled 2011-12-26 (×13): qty 1

## 2011-12-26 MED ORDER — GUAIFENESIN-DM 100-10 MG/5ML PO SYRP
5.0000 mL | ORAL_SOLUTION | ORAL | Status: DC | PRN
  Filled 2011-12-26: qty 5

## 2011-12-26 MED ORDER — VANCOMYCIN HCL 500 MG IV SOLR
500.0000 mg | Freq: Once | INTRAVENOUS | Status: AC
  Administered 2011-12-26: 500 mg via INTRAVENOUS
  Filled 2011-12-26: qty 500

## 2011-12-26 MED ORDER — DIVALPROEX SODIUM ER 500 MG PO TB24
1000.0000 mg | ORAL_TABLET | Freq: Every day | ORAL | Status: DC
  Administered 2011-12-26 – 2011-12-31 (×6): 1000 mg via ORAL
  Filled 2011-12-26 (×7): qty 2

## 2011-12-26 MED ORDER — INSULIN GLARGINE 100 UNIT/ML ~~LOC~~ SOLN
60.0000 [IU] | Freq: Every day | SUBCUTANEOUS | Status: DC
  Administered 2011-12-26 – 2011-12-27 (×2): 60 [IU] via SUBCUTANEOUS
  Filled 2011-12-26: qty 0.6
  Filled 2011-12-26: qty 1
  Filled 2011-12-26: qty 3

## 2011-12-26 MED ORDER — SIMVASTATIN 20 MG PO TABS
20.0000 mg | ORAL_TABLET | Freq: Every day | ORAL | Status: DC
  Administered 2011-12-26 – 2012-01-01 (×6): 20 mg via ORAL
  Filled 2011-12-26 (×7): qty 1

## 2011-12-26 MED ORDER — DIVALPROEX SODIUM ER 500 MG PO TB24
1000.0000 mg | ORAL_TABLET | Freq: Every day | ORAL | Status: DC
  Administered 2011-12-26 – 2011-12-27 (×2): 1000 mg via ORAL
  Filled 2011-12-26 (×2): qty 2

## 2011-12-26 MED ORDER — HEPARIN SODIUM (PORCINE) 5000 UNIT/ML IJ SOLN
5000.0000 [IU] | Freq: Three times a day (TID) | INTRAMUSCULAR | Status: DC
  Administered 2011-12-26 – 2012-01-01 (×17): 5000 [IU] via SUBCUTANEOUS
  Filled 2011-12-26 (×22): qty 1

## 2011-12-26 MED ORDER — SODIUM CHLORIDE 0.9 % IJ SOLN
3.0000 mL | Freq: Two times a day (BID) | INTRAMUSCULAR | Status: DC
  Administered 2011-12-27 – 2012-01-01 (×10): 3 mL via INTRAVENOUS

## 2011-12-26 MED ORDER — MORPHINE SULFATE 4 MG/ML IJ SOLN
4.0000 mg | Freq: Once | INTRAMUSCULAR | Status: AC
  Administered 2011-12-26: 4 mg via INTRAVENOUS
  Filled 2011-12-26: qty 1

## 2011-12-26 MED ORDER — SODIUM CHLORIDE 0.9 % IV SOLN: INTRAVENOUS | Status: DC

## 2011-12-26 MED ORDER — ONDANSETRON HCL 4 MG/2ML IJ SOLN: 4.0000 mg | Freq: Four times a day (QID) | INTRAMUSCULAR | Status: DC | PRN

## 2011-12-26 MED ORDER — ALBUTEROL SULFATE (5 MG/ML) 0.5% IN NEBU: 2.5000 mg | INHALATION_SOLUTION | RESPIRATORY_TRACT | Status: DC | PRN

## 2011-12-26 MED ORDER — PIPERACILLIN-TAZOBACTAM 3.375 G IVPB
3.3750 g | Freq: Once | INTRAVENOUS | Status: AC
  Administered 2011-12-26: 3.375 g via INTRAVENOUS
  Filled 2011-12-26: qty 50

## 2011-12-26 MED ORDER — ONDANSETRON HCL 4 MG/2ML IJ SOLN
4.0000 mg | Freq: Once | INTRAMUSCULAR | Status: AC
  Administered 2011-12-26: 4 mg via INTRAVENOUS
  Filled 2011-12-26: qty 2

## 2011-12-26 MED ORDER — SODIUM CHLORIDE 0.9 % IV BOLUS (SEPSIS)
1000.0000 mL | Freq: Once | INTRAVENOUS | Status: AC
  Administered 2011-12-26: 1000 mL via INTRAVENOUS

## 2011-12-26 MED ORDER — INSULIN ASPART 100 UNIT/ML ~~LOC~~ SOLN
0.0000 [IU] | Freq: Three times a day (TID) | SUBCUTANEOUS | Status: DC
  Administered 2011-12-26: 11 [IU] via SUBCUTANEOUS
  Administered 2011-12-27 (×2): 8 [IU] via SUBCUTANEOUS
  Administered 2011-12-28: 2 [IU] via SUBCUTANEOUS
  Administered 2011-12-28: 3 [IU] via SUBCUTANEOUS
  Administered 2011-12-29: 5 [IU] via SUBCUTANEOUS
  Administered 2011-12-29: 3 [IU] via SUBCUTANEOUS
  Administered 2011-12-31: 2 [IU] via SUBCUTANEOUS
  Filled 2011-12-26: qty 1

## 2011-12-26 NOTE — ED Provider Notes (Signed)
History     CSN: [id number]  Arrival date & time 12/26/11  1312   First MD Initiated Contact with Patient 12/26/11 1345      Chief Complaint  Patient presents with  . Nail Problem    (Consider location/radiation/quality/duration/timing/severity/associated sxs/prior treatment) HPI Comments: Patient presents with left great toe pain and swelling for the past 3 days. He is a diabetic with neuropathy. He had an injury in December from a nail and was treated for cellulitis here. He saw his podiatrist Dr. Izola Price 3 days ago was put on Cipro and Bactrim. He says the redness has now extended greatly up his toe and foot. He says subjective fevers and chills. He denies any nausea or vomiting. There is an ulceration of the medial great toe that is draining. Reports on Monday there was no redness but now the toes are swollen and erythematous  The history is provided by the patient.    Past Medical History  Diagnosis Date  . Diabetes mellitus     Past Surgical History  Procedure Date  . Toe amputation     History reviewed. No pertinent family history.  History  Substance Use Topics  . Smoking status: Not on file  . Smokeless tobacco: Not on file  . Alcohol Use:       Review of Systems  Constitutional: Positive for fever and chills. Negative for activity change and appetite change.  HENT: Negative for congestion and rhinorrhea.   Eyes: Negative for photophobia and visual disturbance.  Respiratory: Negative for cough and shortness of breath.   Cardiovascular: Negative for chest pain.  Gastrointestinal: Negative for nausea, vomiting and abdominal pain.  Genitourinary: Negative for dysuria and hematuria.  Musculoskeletal: Positive for gait problem.  Skin: Negative for rash.  Neurological: Negative for headaches.  Hematological: Negative.   Psychiatric/Behavioral: Negative.     Allergies  Review of patient's allergies indicates no known allergies.  Home Medications   Current  Outpatient Rx  Name Route Sig Dispense Refill  . ALBUTEROL SULFATE HFA 108 (90 BASE) MCG/ACT IN AERS Inhalation Inhale 2 puffs into the lungs every 6 (six) hours as needed. For cold symptoms     . AMLODIPINE BESYLATE 5 MG PO TABS Oral Take 5 mg by mouth daily.    . ATORVASTATIN CALCIUM 20 MG PO TABS Oral Take 20 mg by mouth daily.    Marland Kitchen VITAMIN D 1000 UNITS PO TABS Oral Take 1,000 Units by mouth 2 (two) times daily.    Marland Kitchen CIPROFLOXACIN HCL 750 MG PO TABS Oral Take 750 mg by mouth 2 (two) times daily.    Marland Kitchen DIVALPROEX SODIUM ER 500 MG PO TB24 Oral Take 1,000 mg by mouth daily.    . DULOXETINE HCL 30 MG PO CPEP Oral Take 30 mg by mouth daily.      . IRON 325 (65 FE) MG PO TABS Oral Take 1 tablet by mouth daily.    Marland Kitchen GABAPENTIN 300 MG PO CAPS Oral Take 600 mg by mouth daily.      . INSULIN GLARGINE 100 UNIT/ML [place] SOLN Subcutaneous Inject 60 Units into the skin at bedtime.      Marland Kitchen PIOGLITAZONE HCL 45 MG PO TABS Oral Take 45 mg by mouth daily.    . SULFAMETHOXAZOLE-TMP DS 800-160 MG PO TABS Oral Take 1 tablet by mouth 2 (two) times daily.      BP 87/34  Pulse 79  Temp(Src) 98.3 F (36.8 C) (Oral)  Resp 20  SpO2 100%  Physical Exam  Constitutional: He is oriented to person, place, and time. He appears well-developed and well-nourished. No distress.  HENT:  Head: Normocephalic and atraumatic.  Mouth/Throat: Oropharynx is clear and moist. No oropharyngeal exudate.  Eyes: Conjunctivae are normal. Pupils are equal, round, and reactive to light.  Neck: Normal range of motion. Neck supple.  Cardiovascular: Normal rate, regular rhythm and normal heart sounds.   Pulmonary/Chest: Effort normal and breath sounds normal. No respiratory distress.  Abdominal: Soft. There is no tenderness. There is no rebound and no guarding.  Musculoskeletal: He exhibits edema and tenderness.       Left great toe erythematous and tender to palpation. Ulcerations the medial surface of the great toe with clear drainage +2  DP and PT pulse No paronychia  Neurological: He is alert and oriented to person, place, and time. No cranial nerve deficit.  Skin: Skin is warm.    ED Course  Procedures (including critical care time)  Labs Reviewed  CBC - Abnormal; Notable for the following:    RBC 3.26 (*)    Hemoglobin 9.8 (*)    HCT 28.6 (*)    Platelets 132 (*)    All other components within normal limits  COMPREHENSIVE METABOLIC PANEL - Abnormal; Notable for the following:    Sodium 132 (*)    Potassium 5.7 (*)    Glucose, Bld 356 (*)    BUN 27 (*)    Creatinine, Ser 1.61 (*)    Albumin 3.2 (*)    GFR calc non Af Amer 46 (*)    GFR calc Af Amer 54 (*)    All other components within normal limits  DIFFERENTIAL  LACTIC ACID, PLASMA  PROCALCITONIN  CULTURE, BLOOD (ROUTINE X 2)  CULTURE, BLOOD (ROUTINE X 2)   Dg Foot Complete Left  12/26/2011  *RADIOLOGY REPORT*  Clinical Data: Great toe infection.  Ulceration.  LEFT FOOT - COMPLETE 3+ VIEW  Comparison: 11/18/2011.  Findings: Diabetic type small vessel atherosclerotic calcifications.  Soft tissue swelling is present over the great toe.  There is osteolysis of the volar aspect of the proximal phalanx compatible with active osteomyelitis.  Rare faction of bone is seen on both the frontal and oblique views.  IMPRESSION: Proximal aspect terminal phalanx great toe active osteomyelitis.  Original Report Authenticated By: Andreas Newport, M.D.     1. Osteomyelitis   2. Diabetic foot infection       MDM  Diabetic foot infection with ulcer. Hypotension without tachycardia. Afebrile. Normal mentation.  Broad spectrum antibiotics begun. Labs and cultures obtained. X-ray shows evidence of osteomyelitis.  Acute renal failure with hyperkalemia. Treated with Kayexalate  Admission to stepdown unit discussed with Dr. Thedore Mins.  CRITICAL CARE Performed by: Glynn Octave   Total critical care time: 30  Critical care time was exclusive of separately billable  procedures and treating other patients.  Critical care was necessary to treat or prevent imminent or life-threatening deterioration.  Critical care was time spent personally by me on the following activities: development of treatment plan with patient and/or surrogate as well as nursing, discussions with consultants, evaluation of patient's response to treatment, examination of patient, obtaining history from patient or surrogate, ordering and performing treatments and interventions, ordering and review of laboratory studies, ordering and review of radiographic studies, pulse oximetry and re-evaluation of patient's condition.       Glynn Octave, MD 12/26/11 [phone]

## 2011-12-26 NOTE — Consult Note (Signed)
Reason for Consult:osteomylitis and ulcer and cellulitis left great toe  Referring Physician:Dr. Usher Caleb Joseph is an 56 y.o. male.  HPI: stepped on a nail recently and started developing draining ulcer and ascending cellulitis left great toe  Past Medical History  Diagnosis Date  . Diabetes mellitus   . 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 HYPERLIPIDEMIA 08/26/2007  . Diabetic foot infection 12/26/2011    Past Surgical History  Procedure Date  . Toe amputation     History reviewed. No pertinent family history.  Social History:  does not have a smoking history on file. He does not have any smokeless tobacco history on file. His alcohol and drug histories not on file.  Allergies: No Known Allergies  Medications: I have reviewed the patient's current medications.  Results for orders placed during the hospital encounter of 12/26/11 (from the past 48 hour(s))  CBC     Status: Abnormal   Collection Time   12/26/11  2:31 PM      Component Value Range Comment   WBC 6.2  4.0 - 10.5 (K/uL)    RBC 3.26 (*) 4.22 - 5.81 (MIL/uL)    Hemoglobin 9.8 (*) 13.0 - 17.0 (g/dL)    HCT 40.9 (*) 81.1 - 52.0 (%)    MCV 87.7  78.0 - 100.0 (fL)    MCH 30.1  26.0 - 34.0 (pg)    MCHC 34.3  30.0 - 36.0 (g/dL)    RDW 91.4  78.2 - 95.6 (%)    Platelets 132 (*) 150 - 400 (K/uL)   DIFFERENTIAL     Status: Normal   Collection Time   12/26/11  2:31 PM      Component Value Range Comment   Neutrophils Relative 67  43 - 77 (%)    Neutro Abs 4.1  1.7 - 7.7 (K/uL)    Lymphocytes Relative 23  12 - 46 (%)    Lymphs Abs 1.4  0.7 - 4.0 (K/uL)    Monocytes Relative 9  3 - 12 (%)    Monocytes Absolute 0.6  0.1 - 1.0 (K/uL)    Eosinophils Relative 1  0 - 5 (%)    Eosinophils Absolute 0.1  0.0 - 0.7 (K/uL)    Basophils Relative 0  0 - 1 (%)    Basophils Absolute 0.0  0.0 - 0.1 (K/uL)   COMPREHENSIVE METABOLIC PANEL     Status: Abnormal   Collection Time   12/26/11  2:31 PM      Component Value Range Comment   Sodium 132 (*) 135 - 145 (mEq/L)    Potassium 5.7 (*) 3.5 - 5.1 (mEq/L)    Chloride 97  96 - 112 (mEq/L)    CO2 26  19 - 32 (mEq/L)    Glucose, Bld 356 (*) 70 - 99 (mg/dL)    BUN 27 (*) 6 - 23 (mg/dL)    Creatinine, Ser 2.13 (*) 0.50 - 1.35 (mg/dL)    Calcium 9.2  8.4 - 10.5 (mg/dL)    Total Protein 7.0  6.0 - 8.3 (g/dL)    Albumin 3.2 (*) 3.5 - 5.2 (g/dL)    AST 14  0 - 37 (U/L)    ALT 13  0 - 53 (U/L)    Alkaline Phosphatase 61  39 - 117 (U/L)    Total Bilirubin 0.7  0.3 - 1.2 (mg/dL)    GFR calc non Af Amer 46 (*) >90 (mL/min)  GFR calc Af Amer 54 (*) >90 (mL/min)   GLUCOSE, CAPILLARY     Status: Abnormal   Collection Time   12/26/11  3:29 PM      Component Value Range Comment   Glucose-Capillary 324 (*) 70 - 99 (mg/dL)    Comment 1 Documented in Chart     LACTIC ACID, PLASMA     Status: Normal   Collection Time   12/26/11  4:25 PM      Component Value Range Comment   Lactic Acid, Venous 1.2  0.5 - 2.2 (mmol/L)   PROCALCITONIN     Status: Normal   Collection Time   12/26/11  4:27 PM      Component Value Range Comment   Procalcitonin <0.10       Dg Foot Complete Left  12/26/2011  *RADIOLOGY REPORT*  Clinical Data: Great toe infection.  Ulceration.  LEFT FOOT - COMPLETE 3+ VIEW  Comparison: 11/18/2011.  Findings: Diabetic type small vessel atherosclerotic calcifications.  Soft tissue swelling is present over the great toe.  There is osteolysis of the volar aspect of the proximal phalanx compatible with active osteomyelitis.  Rare faction of bone is seen on both the frontal and oblique views.  IMPRESSION: Proximal aspect terminal phalanx great toe active osteomyelitis.  Original Report Authenticated By: Andreas Newport, M.D.    Review of Systems  All other systems reviewed and are negative.   Blood pressure 117/76, pulse 78, temperature 98.3 F (36.8 C), temperature source Oral, resp. rate 16, height  6\' 3"  (1.905 m), weight 100.699 kg (222 lb), SpO2 98.00%. Physical ExamWagner grade III ulcer left great toe, with sausage digit swelling, cellulitis, and osteomylitis  Assessment/Plan: Osteomylitis, ulcer, cellulitis left great toe. Plan for ABI, and IV antibiotics, will discuss with Dr. Magnus Ivan for surgery Friday, to amputate left great toe at MTP joint.  I'll be out of town tomorrow AM.  Aldean Baker V 12/26/2011, 6:50 PM

## 2011-12-26 NOTE — ED Notes (Signed)
[id number] Ready 

## 2011-12-26 NOTE — Progress Notes (Signed)
Pharmacy: Med Rec  I have personally re-verified patient's home medications with him.  His Depakote PTA dose (1000mg  qhs) is correct per med rec completed by our pharmacy technician.  I have also update comments for his antibiotics. Please refer to updated med rec.  Will resume his home depokote ER dose of 1000mg  qhs for now.  Dorna Leitz, PharmD, BCPS

## 2011-12-26 NOTE — ED Notes (Signed)
Lab to bedside

## 2011-12-26 NOTE — ED Notes (Signed)
Talked with pharmacy about pt's dosing times for vancomycin and merrom. Pt still has vancomycin running from 1st 1000mg  of vancomycin. Will hang 500mg  dose of vancomycin asap and when it finishes will hang merrem.

## 2011-12-26 NOTE — ED Notes (Signed)
Reports hx of infection to left big toe and reports it has reoccurred and having pain. Pt is ambulatory at triage.

## 2011-12-26 NOTE — H&P (Signed)
Nilda Calamity CSN:[id number],MRN:[id number]  Outpatient Primary MD for the patient is Julieanne Manson, MD, MD  With History of -  Past Medical History  Diagnosis Date  . Diabetes mellitus     diabetic neuropathy Left first toe infection Dyslipidemia Anemia of chronic disease  Past Surgical History  Procedure Date  . Toe amputation     in for   Chief Complaint  Patient presents with  . Nail Problem     HPI  Caleb Joseph  is a 56 y.o. male, with history of diabetes mellitus type 2, diabetic neuropathy, recent left great toe infection for which he recently finished oral Bactrim and Cipro treatment, comes in with 3 to four-day history of fever chills, increased swelling and discharge from his left great toe, he also claims that he missed a few doses of his Lantus and diabetic medications, presents to the ER where his initial workup and x-ray was suggestive of left great toe was to mellitus, he was found to be slightly hypotensive, however his blood pressures improved after he received some IV fluid bolus, score to admit the patient for left great toe is to mellitus.    Review of Systems    In addition to the HPI above,  Positive Fever-chills, No Headache, No changes with Vision or hearing, No problems swallowing food or Liquids, No Chest pain, Cough or Shortness of Breath, No Abdominal pain, No Nausea or Vommitting, Bowel movements are regular, No Blood in stool or Urine, No dysuria, No new skin rashes or bruises, No new weakness, tingling, numbness in any extremity, No recent weight gain or loss, No polyuria, polydypsia or polyphagia, No significant Mental Stressors.  A full 10 point Review of Systems was done, except as stated above, all other Review of Systems were negative.   Social History History  Substance Use Topics  . Smoking status: Not on file  . Smokeless tobacco: Not on file  . Alcohol Use:      Family History Diabetes mellitus in mother  Prior to  Admission medications   Medication Sig Start Date End Date Taking? Authorizing Provider  albuterol (PROVENTIL HFA;VENTOLIN HFA) 108 (90 BASE) MCG/ACT inhaler Inhale 2 puffs into the lungs every 6 (six) hours as needed. For cold symptoms    Yes Historical Provider, MD  amLODipine (NORVASC) 5 MG tablet Take 5 mg by mouth daily.   Yes Historical Provider, MD  atorvastatin (LIPITOR) 20 MG tablet Take 20 mg by mouth daily.   Yes Historical Provider, MD  cholecalciferol (VITAMIN D) 1000 UNITS tablet Take 1,000 Units by mouth 2 (two) times daily.   Yes Historical Provider, MD  ciprofloxacin (CIPRO) 750 MG tablet Take 750 mg by mouth 2 (two) times daily.   Yes Historical Provider, MD  divalproex (DEPAKOTE ER) 500 MG 24 hr tablet Take 1,000 mg by mouth daily.   Yes Historical Provider, MD  DULoxetine (CYMBALTA) 30 MG capsule Take 30 mg by mouth daily.     Yes Historical Provider, MD  Ferrous Sulfate (IRON) 325 (65 FE) MG TABS Take 1 tablet by mouth daily.   Yes Historical Provider, MD  gabapentin (NEURONTIN) 300 MG capsule Take 600 mg by mouth daily.     Yes Historical Provider, MD  insulin glargine (LANTUS) 100 UNIT/ML injection Inject 60 Units into the skin at bedtime.     Yes Historical Provider, MD  pioglitazone (ACTOS) 45 MG tablet Take 45 mg by mouth daily.   Yes Historical Provider, MD  sulfamethoxazole-trimethoprim (BACTRIM DS) 800-160 MG  per tablet Take 1 tablet by mouth 2 (two) times daily.   Yes Historical Provider, MD    No Known Allergies  Physical Exam  Vitals  Blood pressure 117/72, pulse 78, temperature 98.3 F (36.8 C), temperature source Oral, resp. rate 16, SpO2 98.00%.   1. General middle-aged male in poor general hygiene lying in bed in NAD,   2. Normal affect and insight, Not Suicidal or Homicidal, Awake Alert, Oriented *3.  3. No F.N deficits, ALL C.Nerves Intact, Strength 5/5 all 4 extremities, Sensation intact all 4 extremities, Plantars down going.  4. Ears and Eyes  appear Normal, Conjunctivae clear, PERRLA. Moist Oral Mucosa.  5. Supple Neck, No JVD, No cervical lymphadenopathy appriciated, No Carotid Bruits.  6. Symmetrical Chest wall movement, Good air movement bilaterally, CTAB.  7. RRR, No Gallops, Rubs or Murmurs, No Parasternal Heave.  8. Positive Bowel Sounds, Abdomen Soft, Non tender, No organomegaly appriciated,       No rebound -guarding or rigidity.  9.  No Cyanosis, Normal Skin Turgor, No Skin Rash or Bruise.  10. Good muscle tone,  joints appear normal , no effusions, Normal ROM. Except left great toe is severely swollen with surrounding ostomy that is and a small open ulcer on top of the anterior aspect of terminal phalanx  11. No Palpable Lymph Nodes in Neck or Axillae     Data Review  CBC  Lab 12/26/11 1431  WBC 6.2  HGB 9.8*  HCT 28.6*  PLT 132*  MCV 87.7  MCH 30.1  MCHC 34.3  RDW 13.1  LYMPHSABS 1.4  MONOABS 0.6  EOSABS 0.1  BASOSABS 0.0  BANDABS --   ------------------------------------------------------------------------------------------------------------------ Chemistries   Lab 12/26/11 1431  NA 132*  K 5.7*  CL 97  CO2 26  GLUCOSE 356*  BUN 27*  CREATININE 1.61*  CALCIUM 9.2  MG --  AST 14  ALT 13  ALKPHOS 61  BILITOT 0.7   ------------------------------------------------------------------------------------------------------------------ CrCl is unknown because both a height and weight (above a minimum accepted value) are required for this calculation. ------------------------------------------------------------------------------------------------------------------ No results found for this basename: TSH,T4TOTAL,FREET3,T3FREE,THYROIDAB in the last 72 hours  Coagulation profile No results found for this basename: INR:5,PROTIME:5 in the last 168 hours ------------------------------------------------------------------------------------------------------------------- No results found for this  basename: DDIMER:2 in the last 72 hours ------------------------------------------------------------------------------------------------------------------- Cardiac Enzymes No results found for this basename: CK:3,CKMB:3,TROPONINI:3,MYOGLOBIN:3 in the last 168 hours ------------------------------------------------------------------------------------------------------------------ No components found with this basename: POCBNP:3 ------------------------------------------------------------------------------------------------------------------  Imaging results:   Dg Foot Complete Left  12/26/2011  *RADIOLOGY REPORT*  Clinical Data: Great toe infection.  Ulceration.  LEFT FOOT - COMPLETE 3+ VIEW  Comparison: 11/18/2011.  Findings: Diabetic type small vessel atherosclerotic calcifications.  Soft tissue swelling is present over the great toe.  There is osteolysis of the volar aspect of the proximal phalanx compatible with active osteomyelitis.  Rare faction of bone is seen on both the frontal and oblique views.  IMPRESSION: Proximal aspect terminal phalanx great toe active osteomyelitis.  Original Report Authenticated By: Andreas Newport, M.D.     Assessment & Plan  #1. 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 Lajoyce Corners orthopedics will see the, obtain ABIs.   #2. Diabetes metastatic to into a controlled-  patient admits to missing his last few doses of Lantus and diabetic medications, will check A1c, will resume his Lantus along with moderate sliding scale insulin, for now will hold his Actos as he is in acute renal insufficiency.   #3. Acute renal failure- likely due to poorly controlled that it is mellitus causing dehydration plus #1  above, will hydrate him will try to avoid any ACE ARBA of pending medications, pain BMP in the morning.   #4. Hyperkalemia secondary to #3 above, patient will get Kayexalate, glycemic control and IV fluids, will repeat potassium in a few hours and monitor the trend.   #5. History of dyslipidemia, anemia of chronic disease - no acute issues outpatient followup recommended we'll continue home medications.   DVT Prophylaxis Heparin   AM Labs Ordered, also please review Full Orders  Admission, patients condition and plan of care including tests being ordered have been discussed with the patient  who indicates understanding and agree with the plan and Code Status.  Code Status Full  Condition GUARDED   Leroy Sea M.D on 12/26/2011 at 3:50 PM  [facility] Office  [phone]

## 2011-12-26 NOTE — ED Notes (Signed)
Received report from Vickey Huger, RN and pt is coming to room#20. Pt will be on contact isolation for hx of MRSA in the past.

## 2011-12-26 NOTE — Progress Notes (Signed)
ANTIBIOTIC CONSULT NOTE - INITIAL  Pharmacy Consult for meropenem and vancomycin Indication: diabetic foot infection,osteo,h/o MRSA  No Known Allergies  Patient Measurements:   Adjusted Body Weight: est weight 105 kg, CrCl ~36 ml/min  Vital Signs: Temp: 98.3 F (36.8 C) (01/30 1537) Temp src: Oral (01/30 1537) BP: 117/72 mmHg (01/30 1537) Pulse Rate: 78  (01/30 1537) Intake/Output from previous day:   Intake/Output from this shift:    Labs:  Basename 12/26/11 1431  WBC 6.2  HGB 9.8*  PLT 132*  LABCREA --  CREATININE 1.61*   The CrCl is unknown because both a height and weight (above a minimum accepted value) are required for this calculation. No results found for this basename: VANCOTROUGH:2,VANCOPEAK:2,VANCORANDOM:2,GENTTROUGH:2,GENTPEAK:2,GENTRANDOM:2,TOBRATROUGH:2,TOBRAPEAK:2,TOBRARND:2,AMIKACINPEAK:2,AMIKACINTROU:2,AMIKACIN:2, in the last 72 hours   Microbiology: No results found for this or any previous visit (from the past 720 hour(s)).  Medical History: Past Medical History  Diagnosis Date  . Diabetes mellitus     Medications:   (Not in a hospital admission) Assessment: 56 yo man with h/o MRSA infection who present today with 3 day h/o L great toe swelling and pain.  He was treated as outpt with Cipro and Bactrim but redness has extended.  Goal of Therapy:  Vancomycin trough level 15-20 mcg/ml  Plan:  Vancomycin 1500 mg X 1 then 1500 mg IV q24 hours.  Meropenem 1 gm IV q12 hours.  Will f/u renal function, check vanc trough when appropriate, f/u cultures.  Reymundo Winship Poteet 12/26/2011,3:59 PM

## 2011-12-26 NOTE — ED Notes (Signed)
Glucose 324

## 2011-12-27 DIAGNOSIS — I739 Peripheral vascular disease, unspecified: Secondary | ICD-10-CM

## 2011-12-27 LAB — CBC
HCT: 30.2 % — ABNORMAL LOW (ref 39.0–52.0)
Hemoglobin: 10.1 g/dL — ABNORMAL LOW (ref 13.0–17.0)
MCH: 29.5 pg (ref 26.0–34.0)
RBC: 3.42 MIL/uL — ABNORMAL LOW (ref 4.22–5.81)

## 2011-12-27 LAB — BASIC METABOLIC PANEL
Chloride: 100 mEq/L (ref 96–112)
GFR calc non Af Amer: 62 mL/min — ABNORMAL LOW (ref >90)
Glucose, Bld: 92 mg/dL (ref 70–99)
Potassium: 4.3 mEq/L (ref 3.5–5.1)
Sodium: 139 mEq/L (ref 135–145)

## 2011-12-27 MED ORDER — VANCOMYCIN HCL IN DEXTROSE 1-5 GM/200ML-% IV SOLN
1000.0000 mg | Freq: Two times a day (BID) | INTRAVENOUS | Status: DC
  Administered 2011-12-27 – 2012-01-01 (×10): 1000 mg via INTRAVENOUS
  Filled 2011-12-27 (×12): qty 200

## 2011-12-27 MED ORDER — SODIUM CHLORIDE 0.9 % IV SOLN
1.0000 g | Freq: Three times a day (TID) | INTRAVENOUS | Status: DC
  Administered 2011-12-27 – 2012-01-01 (×14): 1 g via INTRAVENOUS
  Filled 2011-12-27 (×19): qty 1

## 2011-12-27 NOTE — Progress Notes (Signed)
Utilization Review Completed.  Caleb Joseph T  12/27/2011   

## 2011-12-27 NOTE — Progress Notes (Signed)
ANTIBIOTIC CONSULT NOTE - FOLLOW UP  Pharmacy Consult for Merrem and Vancomycin Indication: L great toe osteomylitis  No Known Allergies  Patient Measurements: Height: 6\' 3"  (190.5 cm) Weight: 222 lb (100.699 kg) IBW/kg (Calculated) : 84.5   Vital Signs: Temp: 98.5 F (36.9 C) (01/31 1000) Temp src: Oral (01/31 1000) BP: 136/86 mmHg (01/31 1000) Pulse Rate: 75  (01/31 1000) Intake/Output from previous day: 01/30 0701 - 01/31 0700 In: 480 [P.O.:480] Out: -  Intake/Output from this shift: Total I/O In: 720 [P.O.:720] Out: 1 [Urine:1]  Labs:  [facility] 12/27/11 0600 12/26/11 2113 12/26/11 1431  WBC 6.7 6.9 6.2  HGB 10.1* 10.1* 9.8*  PLT 167 137* 132*  LABCREA -- -- --  CREATININE 1.27 1.54* 1.61*   Estimated Creatinine Clearance: 77.6 ml/min (by C-G formula based on Cr of 1.27).   Microbiology: Recent Results (from the past 720 hour(s))  CULTURE, BLOOD (ROUTINE X 2)     Status: Normal (Preliminary result)   Collection Time   12/26/11  4:27 PM      Component Value Range Status Comment   Specimen Description BLOOD HAND RIGHT   Final    Special Requests BOTTLES DRAWN AEROBIC ONLY 10CC   Final    Culture  Setup Time [id number]   Final    Culture     Final    Value:        BLOOD CULTURE RECEIVED NO GROWTH TO DATE CULTURE WILL BE HELD FOR 5 DAYS BEFORE ISSUING A FINAL NEGATIVE REPORT   Report Status PENDING   Incomplete   MRSA PCR SCREENING     Status: Normal   Collection Time   12/26/11  6:58 PM      Component Value Range Status Comment   MRSA by PCR NEGATIVE  NEGATIVE  Final     Anti-infectives     Start     Dose/Rate Route Frequency Ordered Stop   12/27/11 1630   vancomycin (VANCOCIN) 1,500 mg in sodium chloride 0.9 % 500 mL IVPB  Status:  Discontinued        1,500 mg 250 mL/hr over 120 Minutes Intravenous Every 24 hours 12/26/11 1616 12/27/11 1301   12/27/11 1600   vancomycin (VANCOCIN) IVPB 1000 mg/200 mL premix        1,000 mg 200 mL/hr over 60 Minutes  Intravenous Every 12 hours 12/27/11 1302     12/27/11 1400   meropenem (MERREM) 1 g in sodium chloride 0.9 % 100 mL IVPB        1 g 200 mL/hr over 30 Minutes Intravenous 3 times per day 12/27/11 1303     12/26/11 1700   meropenem (MERREM) 1 g in sodium chloride 0.9 % 100 mL IVPB  Status:  Discontinued        1 g 200 mL/hr over 30 Minutes Intravenous Every 12 hours 12/26/11 1617 12/27/11 1303   12/26/11 1630   vancomycin (VANCOCIN) 500 mg in sodium chloride 0.9 % 100 mL IVPB        500 mg 100 mL/hr over 60 Minutes Intravenous  Once 12/26/11 1616 12/26/11 1820   12/26/11 1400   vancomycin (VANCOCIN) IVPB 1000 mg/200 mL premix        1,000 mg 200 mL/hr over 60 Minutes Intravenous  Once 12/26/11 1358 12/26/11 1659   12/26/11 1400  piperacillin-tazobactam (ZOSYN) IVPB 3.375 g       3.375 g 12.5 mL/hr over 240 Minutes Intravenous  Once 12/26/11 1358 12/26/11 1916  Assessment: 56 yo M admitted with worsening L great toe infection.  Xray confirmed osteo.  Started on IV antibiotics.  Renal function has improved after hydration to SCr 1.3, CrCl ~ 77.  Will adjust antibiotics appropriately.  Goal of Therapy:  Vancomycin trough level 15-20 mcg/ml  Plan:  Increase Merrem to 1gm IV Q8h - next dose due at 1400.  Change Vancomycin to 1gm IV q12h - next dose due at 1600. Follow up renal funcation and culture data.  [employer], Pharm.D., BCPS Clinical Pharmacist Pager [phone]  12/27/2011,1:08 PM

## 2011-12-27 NOTE — Progress Notes (Signed)
Inpatient Diabetes Program Recommendations  AACE/ADA: New Consensus Statement on Inpatient Glycemic Control (2009)  Target Ranges:  Prepandial:   less than 140 mg/dL      Peak postprandial:   less than 180 mg/dL (1-2 hours)      Critically ill patients:  140 - 180 mg/dL   Reason for Visit: Results for Caleb Joseph, Caleb Joseph (MRN [id number]) as of 12/27/2011 13:47  Ref. Range 12/27/2011 07:46 12/27/2011 11:34  Glucose-Capillary Latest Range: 70-99 mg/dL 91 562 (H)    Inpatient Diabetes Program Recommendations Insulin - Meal Coverage: Consider adding Novolog meal coverage 6 units tid with meals.  Note: Will follow.

## 2011-12-27 NOTE — Progress Notes (Signed)
ABI completed.  Preliminary report is >1.0 bilaterally. 

## 2011-12-27 NOTE — Progress Notes (Signed)
PATIENT DETAILS Name: Caleb Joseph Age: 56 y.o. Sex: male Date of Birth: 07-Dec-1955 Admit Date: 12/26/2011 [id number], MD, MD  Subjective: No major complaints Claims pain and swelling of left great toe significantly better  Objective: Vital signs in last 24 hours: Filed Vitals:   12/26/11 2241 12/27/11 0518 12/27/11 1000 12/27/11 1400  BP: 125/72 121/74 136/86 106/67  Pulse: 93 78 75 74  Temp: 97.3 F (36.3 C) 98.9 F (37.2 C) 98.5 F (36.9 C) 98 F (36.7 C)  TempSrc: Oral Oral Oral Oral  Resp: 18 18 18 18   Height:      Weight:      SpO2: 93% 96% 98% 98%    Weight change:   Body mass index is 27.75 kg/(m^2).  Intake/Output from previous day:  Intake/Output Summary (Last 24 hours) at 12/27/11 1441 Last data filed at 12/27/11 1410  Gross per 24 hour  Intake   1440 ml  Output      1 ml  Net   1439 ml    PHYSICAL EXAM: Gen Exam: Awake and alert with clear speech.   Neck: Supple, No JVD.   Chest: B/L Clear.   CVS: S1 S2 Regular, no murmurs.  Abdomen: soft, BS +, non tender, non distended.  Extremities: no edema, lower extremities warm to touch.Left great toe-swollen with mild erythema, swelling and open ulcer Neurologic: Non Focal.  Skin: No Rash.   Wounds: N/A.    CONSULTS:  orthopedic surgery  LAB RESULTS: CBC  Lab 12/27/11 0600 12/26/11 2113 12/26/11 1431  WBC 6.7 6.9 6.2  HGB 10.1* 10.1* 9.8*  HCT 30.2* 29.4* 28.6*  PLT 167 137* 132*  MCV 88.3 87.8 87.7  MCH 29.5 30.1 30.1  MCHC 33.4 34.4 34.3  RDW 13.1 13.1 13.1  LYMPHSABS -- -- 1.4  MONOABS -- -- 0.6  EOSABS -- -- 0.1  BASOSABS -- -- 0.0  BANDABS -- -- --    Chemistries   Lab 12/27/11 0600 12/26/11 2113 12/26/11 1431  NA 139 -- 132*  K 4.3 3.5 5.7*  CL 100 -- 97  CO2 28 -- 26  GLUCOSE 92 -- 356*  BUN 22 -- 27*  CREATININE 1.27 1.54* 1.61*  CALCIUM 9.0 -- 9.2  MG -- -- --    GFR Estimated Creatinine Clearance: 77.6 ml/min (by C-G formula based on Cr of  1.27).  Coagulation profile  Lab 12/26/11 2113  INR 1.16  PROTIME --    Cardiac Enzymes No results found for this basename: CK:3,CKMB:3,TROPONINI:3,MYOGLOBIN:3 in the last 168 hours  No components found with this basename: POCBNP:3 No results found for this basename: DDIMER:2 in the last 72 hours  Basename 12/26/11 1627  HGBA1C 11.7*   No results found for this basename: CHOL:2,HDL:2,LDLCALC:2,TRIG:2,CHOLHDL:2,LDLDIRECT:2 in the last 72 hours No results found for this basename: TSH,T4TOTAL,FREET3,T3FREE,THYROIDAB in the last 72 hours No results found for this basename: VITAMINB12:2,FOLATE:2,FERRITIN:2,TIBC:2,IRON:2,RETICCTPCT:2 in the last 72 hours No results found for this basename: LIPASE:2,AMYLASE:2 in the last 72 hours  Urine Studies No results found for this basename: UACOL:2,UAPR:2,USPG:2,UPH:2,UTP:2,UGL:2,UKET:2,UBIL:2,UHGB:2,UNIT:2,UROB:2,ULEU:2,UEPI:2,UWBC:2,URBC:2,UBAC:2,CAST:2,CRYS:2,UCOM:2,BILUA:2 in the last 72 hours  MICROBIOLOGY: Recent Results (from the past 240 hour(s))  CULTURE, BLOOD (ROUTINE X 2)     Status: Normal (Preliminary result)   Collection Time   12/26/11  4:27 PM      Component Value Range Status Comment   Specimen Description BLOOD HAND RIGHT   Final    Special Requests BOTTLES DRAWN AEROBIC ONLY 10CC   Final    Culture  Setup Time  [id number]   Final    Culture     Final    Value:        BLOOD CULTURE RECEIVED NO GROWTH TO DATE CULTURE WILL BE HELD FOR 5 DAYS BEFORE ISSUING A FINAL NEGATIVE REPORT   Report Status PENDING   Incomplete   MRSA PCR SCREENING     Status: Normal   Collection Time   12/26/11  6:58 PM      Component Value Range Status Comment   MRSA by PCR NEGATIVE  NEGATIVE  Final     RADIOLOGY STUDIES/RESULTS: Dg Foot Complete Left  12/26/2011  *RADIOLOGY REPORT*  Clinical Data: Great toe infection.  Ulceration.  LEFT FOOT - COMPLETE 3+ VIEW  Comparison: 11/18/2011.  Findings: Diabetic type small vessel atherosclerotic  calcifications.  Soft tissue swelling is present over the great toe.  There is osteolysis of the volar aspect of the proximal phalanx compatible with active osteomyelitis.  Rare faction of bone is seen on both the frontal and oblique views.  IMPRESSION: Proximal aspect terminal phalanx great toe active osteomyelitis.  Original Report Authenticated By: Andreas Newport, M.D.    MEDICATIONS: Scheduled Meds:   . cholecalciferol  1,000 Units Oral BID  . divalproex  1,000 mg Oral QHS  . DULoxetine  30 mg Oral Daily  . gabapentin  600 mg Oral Daily  . heparin  5,000 Units Subcutaneous Q8H  . insulin aspart  0-15 Units Subcutaneous TID WC  . insulin aspart  0-5 Units Subcutaneous QHS  . insulin glargine  60 Units Subcutaneous Daily  . meropenem (MERREM) IV  1 g Intravenous Q8H  .  morphine injection  4 mg Intravenous Once  . ondansetron (ZOFRAN) IV  4 mg Intravenous Once  . piperacillin-tazobactam (ZOSYN)  IV  3.375 g Intravenous Once  . simvastatin  20 mg Oral Daily  . sodium chloride  1,000 mL Intravenous Once  . sodium chloride  3 mL Intravenous Q12H  . sodium polystyrene  45 g Oral Once  . vancomycin  500 mg Intravenous Once  . vancomycin  1,000 mg Intravenous Once  . vancomycin  1,000 mg Intravenous Q12H  . DISCONTD: divalproex  1,000 mg Oral Daily  . DISCONTD: meropenem (MERREM) IV  1 g Intravenous Q12H  . DISCONTD: simvastatin  20 mg Oral Daily  . DISCONTD: vancomycin  1,500 mg Intravenous Q24H   Continuous Infusions:   . sodium chloride 1,000 mL/hr at 12/27/11 1128  . sodium chloride 100 mL/hr at 12/26/11 1830  . DISCONTD: sodium chloride     PRN Meds:.albuterol, guaiFENesin-dextromethorphan, HYDROcodone-acetaminophen, ondansetron (ZOFRAN) IV, ondansetron  Antibiotics: Anti-infectives     Start     Dose/Rate Route Frequency Ordered Stop   12/27/11 1630   vancomycin (VANCOCIN) 1,500 mg in sodium chloride 0.9 % 500 mL IVPB  Status:  Discontinued        1,500 mg 250 mL/hr over  120 Minutes Intravenous Every 24 hours 12/26/11 1616 12/27/11 1301   12/27/11 1600   vancomycin (VANCOCIN) IVPB 1000 mg/200 mL premix        1,000 mg 200 mL/hr over 60 Minutes Intravenous Every 12 hours 12/27/11 1302     12/27/11 1400   meropenem (MERREM) 1 g in sodium chloride 0.9 % 100 mL IVPB        1 g 200 mL/hr over 30 Minutes Intravenous 3 times per day 12/27/11 1303     12/26/11 1700   meropenem (MERREM) 1 g in sodium chloride 0.9 % 100  mL IVPB  Status:  Discontinued        1 g 200 mL/hr over 30 Minutes Intravenous Every 12 hours 12/26/11 1617 12/27/11 1303   12/26/11 1630   vancomycin (VANCOCIN) 500 mg in sodium chloride 0.9 % 100 mL IVPB        500 mg 100 mL/hr over 60 Minutes Intravenous  Once 12/26/11 1616 12/26/11 1820   12/26/11 1400   vancomycin (VANCOCIN) IVPB 1000 mg/200 mL premix        1,000 mg 200 mL/hr over 60 Minutes Intravenous  Once 12/26/11 1358 12/26/11 1659   12/26/11 1400  piperacillin-tazobactam (ZOSYN) IVPB 3.375 g       3.375 g 12.5 mL/hr over 240 Minutes Intravenous  Once 12/26/11 1358 12/26/11 1916          Assessment/Plan: Patient Active Hospital Problem List: Diabetic foot infection  -clinically better, afebrile with no leukocytosis -continue with Vancomycin and Meropenem -ABI's  Within normal limits -Appreciate Ortho input  Diabetes Mellitus -Continue with Lantus and SSI  DIABETIC PERIPHERAL NEUROPATHY  -continue with Neurontin  ARF -resolved with IVF -likely pre-renal-given diabetic foot  HYPERLIPIDEMIA  -Continue with Statin  HYPERKALEMIA -resolved   HTN -Antihypertensives held on admission for relative hypotension -BP currently controlled  -monitor off BP meds for now  ANEMIA -likely secondary to chronic disease -monitor  Disposition: Remain inpatient  DVT Prophylaxis: Subcutaneous Heparin  Code Status: Full Code  Maretta Bees,  MD. 12/27/2011, 2:41 PM

## 2011-12-27 NOTE — Progress Notes (Signed)
Patient ID: Caleb Joseph, male   DOB: 03-17-1956, 56 y.o.   MRN: [id number] Dr. Lajoyce Corners asked me to stop by to check on Caleb Joseph and assess his great toe infection.  Caleb Joseph states that the swelling is down dramatically over the last 24 hours and that the drainage has been clear and minimal.  His peripheral WBC is normal.  He says that he is walking better as well.  The Hospitalist's note today also describes improvement.  Exam: The left great toe is swollen and there is a medial wound with drainage.  I can still express purulence from this.  Rec: Caleb Joseph would prefer no operation at the moment.  I told him that I believe that he will still likely need to have surgery in the near future.  He wants to hold off on surgery for now and give the IV antibiotics more time. He will not agree to surgery at the moment.  He is stable clinically, so there is no harm in waiting.

## 2011-12-28 DIAGNOSIS — M869 Osteomyelitis, unspecified: Secondary | ICD-10-CM

## 2011-12-28 LAB — SEDIMENTATION RATE: Sed Rate: 70 mm/hr — ABNORMAL HIGH (ref 0–16)

## 2011-12-28 LAB — BASIC METABOLIC PANEL
BUN: 17 mg/dL (ref 6–23)
CO2: 25 mEq/L (ref 19–32)
Calcium: 8.4 mg/dL (ref 8.4–10.5)
Creatinine, Ser: 0.92 mg/dL (ref 0.50–1.35)
Glucose, Bld: 258 mg/dL — ABNORMAL HIGH (ref 70–99)

## 2011-12-28 LAB — CBC
HCT: 27.3 % — ABNORMAL LOW (ref 39.0–52.0)
Hemoglobin: 9.1 g/dL — ABNORMAL LOW (ref 13.0–17.0)
MCH: 30 pg (ref 26.0–34.0)
MCV: 90.1 fL (ref 78.0–100.0)
RBC: 3.03 MIL/uL — ABNORMAL LOW (ref 4.22–5.81)

## 2011-12-28 LAB — GLUCOSE, CAPILLARY: Glucose-Capillary: 113 mg/dL — ABNORMAL HIGH (ref 70–99)

## 2011-12-28 MED ORDER — INSULIN GLARGINE 100 UNIT/ML ~~LOC~~ SOLN
65.0000 [IU] | Freq: Every day | SUBCUTANEOUS | Status: DC
  Administered 2011-12-29 – 2011-12-31 (×4): 65 [IU] via SUBCUTANEOUS
  Filled 2011-12-28: qty 3

## 2011-12-28 MED ORDER — INSULIN ASPART 100 UNIT/ML ~~LOC~~ SOLN
4.0000 [IU] | Freq: Three times a day (TID) | SUBCUTANEOUS | Status: DC
  Administered 2011-12-28 – 2011-12-31 (×7): 4 [IU] via SUBCUTANEOUS

## 2011-12-28 NOTE — Progress Notes (Signed)
PATIENT DETAILS Name: Caleb Joseph Age: 56 y.o. Sex: male Date of Birth: 28-Sep-1956 Admit Date: 12/26/2011 ZOX:[id number], MD, MD  Subjective: No major complaints-toe essentially same  Objective: Vital signs in last 24 hours: Filed Vitals:   12/27/11 2051 12/28/11 0434 12/28/11 1000 12/28/11 1400  BP: 110/68 110/62 101/60 126/70  Pulse: 73 71 70 73  Temp: 97.5 F (36.4 C) 98.3 F (36.8 C) 98 F (36.7 C) 98.6 F (37 C)  TempSrc: Oral Oral Oral Oral  Resp: 20 20 20 20   Height:      Weight:      SpO2: 95% 95% 96% 96%    Weight change:   Body mass index is 27.75 kg/(m^2).  Intake/Output from previous day:  Intake/Output Summary (Last 24 hours) at 12/28/11 1602 Last data filed at 12/28/11 1500  Gross per 24 hour  Intake    480 ml  Output      0 ml  Net    480 ml    PHYSICAL EXAM: Gen Exam: Awake and alert with clear speech.   Neck: Supple, No JVD.   Chest: B/L Clear.   CVS: S1 S2 Regular, no murmurs.  Abdomen: soft, BS +, non tender, non distended.  Extremities: no edema, lower extremities warm to touch.Left great toe-swollen with mild erythema, swelling and open ulcer Neurologic: Non Focal.  Skin: No Rash.   Wounds: N/A.    CONSULTS:  orthopedic surgery  LAB RESULTS: CBC  Lab 12/28/11 0550 12/27/11 0600 12/26/11 2113 12/26/11 1431  WBC 4.3 6.7 6.9 6.2  HGB 9.1* 10.1* 10.1* 9.8*  HCT 27.3* 30.2* 29.4* 28.6*  PLT 151 167 137* 132*  MCV 90.1 88.3 87.8 87.7  MCH 30.0 29.5 30.1 30.1  MCHC 33.3 33.4 34.4 34.3  RDW 13.2 13.1 13.1 13.1  LYMPHSABS -- -- -- 1.4  MONOABS -- -- -- 0.6  EOSABS -- -- -- 0.1  BASOSABS -- -- -- 0.0  BANDABS -- -- -- --    Chemistries   Lab 12/28/11 0550 12/27/11 0600 12/26/11 2113 12/26/11 1431  NA 136 139 -- 132*  K 4.0 4.3 3.5 5.7*  CL 101 100 -- 97  CO2 25 28 -- 26  GLUCOSE 258* 92 -- 356*  BUN 17 22 -- 27*  CREATININE 0.92 1.27 1.54* 1.61*  CALCIUM 8.4 9.0 -- 9.2  MG -- -- -- --    GFR Estimated  Creatinine Clearance: 107.2 ml/min (by C-G formula based on Cr of 0.92).  Coagulation profile  Lab 12/26/11 2113  INR 1.16  PROTIME --    Cardiac Enzymes No results found for this basename: CK:3,CKMB:3,TROPONINI:3,MYOGLOBIN:3 in the last 168 hours  No components found with this basename: POCBNP:3 No results found for this basename: DDIMER:2 in the last 72 hours  Basename 12/26/11 1627  HGBA1C 11.7*   No results found for this basename: CHOL:2,HDL:2,LDLCALC:2,TRIG:2,CHOLHDL:2,LDLDIRECT:2 in the last 72 hours No results found for this basename: TSH,T4TOTAL,FREET3,T3FREE,THYROIDAB in the last 72 hours No results found for this basename: VITAMINB12:2,FOLATE:2,FERRITIN:2,TIBC:2,IRON:2,RETICCTPCT:2 in the last 72 hours No results found for this basename: LIPASE:2,AMYLASE:2 in the last 72 hours  Urine Studies No results found for this basename: UACOL:2,UAPR:2,USPG:2,UPH:2,UTP:2,UGL:2,UKET:2,UBIL:2,UHGB:2,UNIT:2,UROB:2,ULEU:2,UEPI:2,UWBC:2,URBC:2,UBAC:2,CAST:2,CRYS:2,UCOM:2,BILUA:2 in the last 72 hours  MICROBIOLOGY: Recent Results (from the past 240 hour(s))  CULTURE, BLOOD (ROUTINE X 2)     Status: Normal (Preliminary result)   Collection Time   12/26/11  3:50 PM      Component Value Range Status Comment   Specimen Description BLOOD ARM RIGHT   Final  Special Requests BOTTLES DRAWN AEROBIC AND ANAEROBIC B 10CC R 7CC   Final    Culture  Setup Time [id number]   Final    Culture     Final    Value:        BLOOD CULTURE RECEIVED NO GROWTH TO DATE CULTURE WILL BE HELD FOR 5 DAYS BEFORE ISSUING A FINAL NEGATIVE REPORT   Report Status PENDING   Incomplete   CULTURE, BLOOD (ROUTINE X 2)     Status: Normal (Preliminary result)   Collection Time   12/26/11  4:27 PM      Component Value Range Status Comment   Specimen Description BLOOD HAND RIGHT   Final    Special Requests BOTTLES DRAWN AEROBIC ONLY 10CC   Final    Culture  Setup Time [id number]   Final    Culture     Final     Value:        BLOOD CULTURE RECEIVED NO GROWTH TO DATE CULTURE WILL BE HELD FOR 5 DAYS BEFORE ISSUING A FINAL NEGATIVE REPORT   Report Status PENDING   Incomplete   MRSA PCR SCREENING     Status: Normal   Collection Time   12/26/11  6:58 PM      Component Value Range Status Comment   MRSA by PCR NEGATIVE  NEGATIVE  Final     RADIOLOGY STUDIES/RESULTS: Dg Foot Complete Left  12/26/2011  *RADIOLOGY REPORT*  Clinical Data: Great toe infection.  Ulceration.  LEFT FOOT - COMPLETE 3+ VIEW  Comparison: 11/18/2011.  Findings: Diabetic type small vessel atherosclerotic calcifications.  Soft tissue swelling is present over the great toe.  There is osteolysis of the volar aspect of the proximal phalanx compatible with active osteomyelitis.  Rare faction of bone is seen on both the frontal and oblique views.  IMPRESSION: Proximal aspect terminal phalanx great toe active osteomyelitis.  Original Report Authenticated By: Andreas Newport, M.D.    MEDICATIONS: Scheduled Meds:    . cholecalciferol  1,000 Units Oral BID  . divalproex  1,000 mg Oral QHS  . DULoxetine  30 mg Oral Daily  . gabapentin  600 mg Oral Daily  . heparin  5,000 Units Subcutaneous Q8H  . insulin aspart  0-15 Units Subcutaneous TID WC  . insulin aspart  0-5 Units Subcutaneous QHS  . insulin glargine  60 Units Subcutaneous Daily  . meropenem (MERREM) IV  1 g Intravenous Q8H  . simvastatin  20 mg Oral Daily  . sodium chloride  3 mL Intravenous Q12H  . vancomycin  1,000 mg Intravenous Q12H   Continuous Infusions:  PRN Meds:.albuterol, guaiFENesin-dextromethorphan, HYDROcodone-acetaminophen, ondansetron (ZOFRAN) IV, ondansetron  Antibiotics: Anti-infectives     Start     Dose/Rate Route Frequency Ordered Stop   12/27/11 1630   vancomycin (VANCOCIN) 1,500 mg in sodium chloride 0.9 % 500 mL IVPB  Status:  Discontinued        1,500 mg 250 mL/hr over 120 Minutes Intravenous Every 24 hours 12/26/11 1616 12/27/11 1301   12/27/11 1600    vancomycin (VANCOCIN) IVPB 1000 mg/200 mL premix        1,000 mg 200 mL/hr over 60 Minutes Intravenous Every 12 hours 12/27/11 1302     12/27/11 1400   meropenem (MERREM) 1 g in sodium chloride 0.9 % 100 mL IVPB        1 g 200 mL/hr over 30 Minutes Intravenous 3 times per day 12/27/11 1303     12/26/11 1700   meropenem (  MERREM) 1 g in sodium chloride 0.9 % 100 mL IVPB  Status:  Discontinued        1 g 200 mL/hr over 30 Minutes Intravenous Every 12 hours 12/26/11 1617 12/27/11 1303   12/26/11 1630   vancomycin (VANCOCIN) 500 mg in sodium chloride 0.9 % 100 mL IVPB        500 mg 100 mL/hr over 60 Minutes Intravenous  Once 12/26/11 1616 12/26/11 1820   12/26/11 1400   vancomycin (VANCOCIN) IVPB 1000 mg/200 mL premix        1,000 mg 200 mL/hr over 60 Minutes Intravenous  Once 12/26/11 1358 12/26/11 1659   12/26/11 1400   piperacillin-tazobactam (ZOSYN) IVPB 3.375 g        3.375 g 12.5 mL/hr over 240 Minutes Intravenous  Once 12/26/11 1358 12/26/11 1916          Assessment/Plan: Patient Active Hospital Problem List: Diabetic foot infection  -clinically better, afebrile with no leukocytosis -continue with Vancomycin and Meropenem -ABI's  Within normal limits -Appreciate Ortho-patient refused amputation-will get ID to see to recommend outpatient antibiotic regimen -check ESR  Diabetes Mellitus -increase Lantus to 65 units -start scheduled premeal novolog  DIABETIC PERIPHERAL NEUROPATHY  -continue with Neurontin  ARF -resolved with IVF -likely pre-renal-given diabetic foot  HYPERLIPIDEMIA  -Continue with Statin  HYPERKALEMIA -resolved   HTN -Antihypertensives held on admission for relative hypotension -BP currently controlled  -monitor off BP meds for now  ANEMIA -likely secondary to chronic disease -monitor  Disposition: Remain inpatient  DVT Prophylaxis: Subcutaneous Heparin  Code Status: Full Code  Maretta Bees,  MD. 12/28/2011, 4:02 PM

## 2011-12-28 NOTE — Consult Note (Signed)
Date of Admission:  12/26/2011  Date of Consult:  12/28/2011  Reason for Consult:Osteomyelitis Referring Physician: Jerral Ralph  Impression/Recommendation Osteomyelitis of L great toe Poorly controlled DM2  Would- continue vanco and merrem Diabetic control. He states he is now amenable to surgery, proccede as he allows.  Await BCx.  Check HIV test.    Caleb Joseph is an 56 y.o. male.  HPI: 56 year old male with a history of type 2 diabetes diagnosed in 1993. 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 Due to an injury, he received tetanus vaccine in July 2012.   He states he stepped on a nail on 11/14/2011. He did not feel this. As an outpatient, he states he was treated with doxycycline and his foot and great toe improved. However, after completing this therapy, his left great toe began to swell and he also developed swelling in his foot and calf. He developed fevers and chills and came to the emergency room. He was found to have a glucose of 356. He has been evaluated by surgery and has deferred surgery at this point.  Past Medical History  Diagnosis Date  . Diabetes mellitus   . 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 HYPERLIPIDEMIA 08/26/2007  . Diabetic foot infection 12/26/2011    Past Surgical History  Procedure Date  . Toe amputation   ergies:   No Known Allergies  Medications: I have reviewed the patient's current medications.  Social History:  reports that he has been passively smoking.  His smokeless tobacco use includes Chew. His alcohol and drug histories not on file.  History reviewed. No pertinent family history.  General ROS: negative for - d/c from his toe.  Ophthalmic ROS: positive for - decreased vision, diabetic retinopathy, and cataracts. Had ophtho eval last year and was set up for further eval but could not afford Gastrointestinal  ROS: negative for - constipation or diarrhea Genito-Urinary ROS: positive for - incontinence negative for - dysuria  Blood pressure 126/70, pulse 73, temperature 98.6 F (37 C), temperature source Oral, resp. rate 20, height 6\' 3"  (1.905 m), weight 100.699 kg (222 lb), SpO2 96.00%. BP 111/71  Pulse 77  Temp(Src) 99.1 F (37.3 C) (Oral)  Resp 20  Ht 6\' 3"  (1.905 m)  Wt 100.699 kg (222 lb)  BMI 27.75 kg/m2  SpO2 98% General appearance: alert, cooperative, appears stated age and no distress Eyes: negative findings: pupils equal, round, reactive to light and accomodation Throat: abnormal findings: markedly poor dentition Lungs: clear to auscultation bilaterally Heart: regular rate and rhythm Abdomen: normal findings: bowel sounds normal and soft, non-tender Extremities: his L great toe is markedly swollen/edematous with skin breakdown. there is a wound on the medial aspect. no d/c. his foot is warm, red and tender.  Neurologic: Sensory: reduced tactile sense decreased light touch bilateral LE   Results for orders placed during the hospital encounter of 12/26/11 (from the past 48 hour(s))  MRSA PCR SCREENING     Status: Normal   Collection Time   12/26/11  6:58 PM      Component Value Range Comment   MRSA by PCR NEGATIVE  NEGATIVE    GLUCOSE, CAPILLARY     Status: Normal   Collection Time   12/26/11  7:27 PM      Component Value Range Comment   Glucose-Capillary 81  70 - 99 (mg/dL)   GLUCOSE, CAPILLARY  Status: Normal   Collection Time   12/26/11  9:08 PM      Component Value Range Comment   Glucose-Capillary 75  70 - 99 (mg/dL)   POTASSIUM     Status: Normal   Collection Time   12/26/11  9:13 PM      Component Value Range Comment   Potassium 3.5  3.5 - 5.1 (mEq/L)   CBC     Status: Abnormal   Collection Time   12/26/11  9:13 PM      Component Value Range Comment   WBC 6.9  4.0 - 10.5 (K/uL)    RBC 3.35 (*) 4.22 - 5.81 (MIL/uL)    Hemoglobin 10.1 (*) 13.0 - 17.0 (g/dL)      HCT 16.1 (*) 09.6 - 52.0 (%)    MCV 87.8  78.0 - 100.0 (fL)    MCH 30.1  26.0 - 34.0 (pg)    MCHC 34.4  30.0 - 36.0 (g/dL)    RDW 04.5  40.9 - 81.1 (%)    Platelets 137 (*) 150 - 400 (K/uL)   CREATININE, SERUM     Status: Abnormal   Collection Time   12/26/11  9:13 PM      Component Value Range Comment   Creatinine, Ser 1.54 (*) 0.50 - 1.35 (mg/dL)    GFR calc non Af Amer 49 (*) >90 (mL/min)    GFR calc Af Amer 57 (*) >90 (mL/min)   PROTIME-INR     Status: Normal   Collection Time   12/26/11  9:13 PM      Component Value Range Comment   Prothrombin Time 15.0  11.6 - 15.2 (seconds)    INR 1.16  0.00 - 1.49    BASIC METABOLIC PANEL     Status: Abnormal   Collection Time   12/27/11  6:00 AM      Component Value Range Comment   Sodium 139  135 - 145 (mEq/L)    Potassium 4.3  3.5 - 5.1 (mEq/L)    Chloride 100  96 - 112 (mEq/L)    CO2 28  19 - 32 (mEq/L)    Glucose, Bld 92  70 - 99 (mg/dL)    BUN 22  6 - 23 (mg/dL)    Creatinine, Ser 9.14  0.50 - 1.35 (mg/dL)    Calcium 9.0  8.4 - 10.5 (mg/dL)    GFR calc non Af Amer 62 (*) >90 (mL/min)    GFR calc Af Amer 71 (*) >90 (mL/min)   CBC     Status: Abnormal   Collection Time   12/27/11  6:00 AM      Component Value Range Comment   WBC 6.7  4.0 - 10.5 (K/uL)    RBC 3.42 (*) 4.22 - 5.81 (MIL/uL)    Hemoglobin 10.1 (*) 13.0 - 17.0 (g/dL)    HCT 78.2 (*) 95.6 - 52.0 (%)    MCV 88.3  78.0 - 100.0 (fL)    MCH 29.5  26.0 - 34.0 (pg)    MCHC 33.4  30.0 - 36.0 (g/dL)    RDW 21.3  08.6 - 57.8 (%)    Platelets 167  150 - 400 (K/uL)   GLUCOSE, CAPILLARY     Status: Normal   Collection Time   12/27/11  7:46 AM      Component Value Range Comment   Glucose-Capillary 91  70 - 99 (mg/dL)   GLUCOSE, CAPILLARY     Status: Abnormal   Collection Time  12/27/11 11:34 AM      Component Value Range Comment   Glucose-Capillary 268 (*) 70 - 99 (mg/dL)   GLUCOSE, CAPILLARY     Status: Abnormal   Collection Time   12/27/11  5:07 PM      Component  Value Range Comment   Glucose-Capillary 287 (*) 70 - 99 (mg/dL)   GLUCOSE, CAPILLARY     Status: Abnormal   Collection Time   12/27/11  8:49 PM      Component Value Range Comment   Glucose-Capillary 322 (*) 70 - 99 (mg/dL)   CBC     Status: Abnormal   Collection Time   12/28/11  5:50 AM      Component Value Range Comment   WBC 4.3  4.0 - 10.5 (K/uL)    RBC 3.03 (*) 4.22 - 5.81 (MIL/uL)    Hemoglobin 9.1 (*) 13.0 - 17.0 (g/dL)    HCT 40.9 (*) 81.1 - 52.0 (%)    MCV 90.1  78.0 - 100.0 (fL)    MCH 30.0  26.0 - 34.0 (pg)    MCHC 33.3  30.0 - 36.0 (g/dL)    RDW 91.4  78.2 - 95.6 (%)    Platelets 151  150 - 400 (K/uL)   BASIC METABOLIC PANEL     Status: Abnormal   Collection Time   12/28/11  5:50 AM      Component Value Range Comment   Sodium 136  135 - 145 (mEq/L)    Potassium 4.0  3.5 - 5.1 (mEq/L)    Chloride 101  96 - 112 (mEq/L)    CO2 25  19 - 32 (mEq/L)    Glucose, Bld 258 (*) 70 - 99 (mg/dL)    BUN 17  6 - 23 (mg/dL)    Creatinine, Ser 2.13  0.50 - 1.35 (mg/dL)    Calcium 8.4  8.4 - 10.5 (mg/dL)    GFR calc non Af Amer >90  >90 (mL/min)    GFR calc Af Amer >90  >90 (mL/min)   GLUCOSE, CAPILLARY     Status: Abnormal   Collection Time   12/28/11  7:49 AM      Component Value Range Comment   Glucose-Capillary 140 (*) 70 - 99 (mg/dL)    Comment 1 Documented in Chart      Comment 2 Notify RN     GLUCOSE, CAPILLARY     Status: Abnormal   Collection Time   12/28/11 11:35 AM      Component Value Range Comment   Glucose-Capillary 113 (*) 70 - 99 (mg/dL)    Comment 1 Documented in Chart      Comment 2 Notify RN     SEDIMENTATION RATE     Status: Abnormal   Collection Time   12/28/11  4:11 PM      Component Value Range Comment   Sed Rate 70 (*) 0 - 16 (mm/hr)   GLUCOSE, CAPILLARY     Status: Abnormal   Collection Time   12/28/11  4:58 PM      Component Value Range Comment   Glucose-Capillary 190 (*) 70 - 99 (mg/dL)    Comment 1 Documented in Chart      Comment 2 Notify RN          Component Value Date/Time   SDES BLOOD HAND RIGHT 12/26/2011 1627   SPECREQUEST BOTTLES DRAWN AEROBIC ONLY 10CC 12/26/2011 1627   CULT        BLOOD CULTURE RECEIVED NO  GROWTH TO DATE CULTURE WILL BE HELD FOR 5 DAYS BEFORE ISSUING A FINAL NEGATIVE REPORT 12/26/2011 1627   REPTSTATUS PENDING 12/26/2011 1627   No results found.  Thank you so much for this interesting consult,   Johny Sax [phone] 12/28/2011, 6:16 PM

## 2011-12-28 NOTE — Progress Notes (Signed)
CARE MANAGEMENT NOTE 12/28/2011  Patient:  Caleb Joseph, Caleb Joseph   Account Number:  [id number]  Date Initiated:  12/27/2011  Documentation initiated by:  Junius Creamer  Subjective/Objective Assessment:   adm w diabetic foot infection, ortho seeing and poss toe removal on 2-1     Action/Plan:   lives alone, pcp listed as dr Julieanne Manson   Anticipated DC Date:  12/30/2011   Anticipated DC Plan:  HOME W HOME HEALTH SERVICES      DC Planning Services  CM consult      [facility] Choice  DURABLE MEDICAL EQUIPMENT  HOME HEALTH   Choice offered to / List presented to:  C-1 Patient   DME arranged  IV PUMP/EQUIPMENT      DME agency  [facility]     [facility] arranged  HH-1 RN      [facility] agency  [facility]   Status of service:   Medicare Important Message given?   (If response is "NO", the following Medicare IM given date fields will be blank) Date Medicare IM given:   Date Additional Medicare IM given:    Discharge Disposition:  HOME W HOME HEALTH SERVICES  Per UR Regulation:    Comments:  2/1 pt ambul in room. gave pt [facility] agency list. he has used [facility] in past for iv antibitotics and wound care and would like them again. ref to [facility] w [facility] for rn for iv antibiotics and wound care again. poss dc later today. Suella [place] [phone]  1/31 debbie Margaree Sandhu rn,bsn [phone] HOME HEALTH AGENCIES SERVING [facility]   Agencies that are Medicare-Certified and are affiliated with [name]  Telephone Number Address  [facility]   The [facility] has ownership interest in this company; however, you are under no obligation to use this agency. [phone] or  [phone] [address] [place], [place] [address]   Agencies that are Medicare-Certified and are not affiliated with [name]  Agency Telephone Number Address  [facility] [phone] Fax [phone] [address], [address] [place], [place]  [address]  [facility] [phone] or [phone] Fax [phone] [address] [place], [place] [address]  [facility] [phone] Fax [phone] [address] [place], [place] [address]  [facility] [phone] Fax [phone] [address] [place], [place]  [address]  [facility] [phone] Fax [phone] [address] Suite [place], [place] [address]  [facility] [phone] [address] [place], [place] [address]  [facility] [phone]  [address] [place], [place] [address]  [facility] [phone] or [phone] Fax [phone] [address] [place], [place]  [address]  [facility] [phone] Fax [phone] [address] [place], [place]  [address]  [facility]  [phone] Fax [phone]  [address] [place], [place] [address]

## 2011-12-28 NOTE — Consult Note (Signed)
WOC consult Note Reason for Consult: Consult requested for toe wound.  Pt has osteomyelitis.  This is beyond WOCN scope of practice.  Ortho is now following for assessment and plan of care. Please refer to this team if further questions regarding wound care. Will not plan to follow further unless re-consulted.  [address] RN, MSN, [employer]  [phone]

## 2011-12-29 LAB — GLUCOSE, CAPILLARY
Glucose-Capillary: 60 mg/dL — ABNORMAL LOW (ref 70–99)
Glucose-Capillary: 98 mg/dL (ref 70–99)
Glucose-Capillary: 154 mg/dL — ABNORMAL HIGH (ref 70–99)
Glucose-Capillary: 185 mg/dL — ABNORMAL HIGH (ref 70–99)

## 2011-12-29 NOTE — Consult Note (Signed)
Reason for Consult:left great toe osteomyelitis Referring Physician: Windell Norfolk      MD  Caleb Joseph is an 56 y.o. male.  HPI: IDDM stepped on a nail that went thru shoe with left great toe injection.  Peripheral neuropathy, MRSA Hx   Sed rate increasing despite IV ABX  Past Medical History  Diagnosis Date  . Diabetes mellitus   . 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 HYPERLIPIDEMIA 08/26/2007  . Diabetic foot infection 12/26/2011    Past Surgical History  Procedure Date  . Toe amputation     History reviewed. No pertinent family history.  Social History:  reports that he has been passively smoking.  His smokeless tobacco use includes Chew. His alcohol and drug histories not on file.  Allergies: No Known Allergies  Medications: I have reviewed the patient's current medications.  Results for orders placed during the hospital encounter of 12/26/11 (from the past 48 hour(s))  GLUCOSE, CAPILLARY     Status: Abnormal   Collection Time   12/27/11 11:34 AM      Component Value Range Comment   Glucose-Capillary 268 (*) 70 - 99 (mg/dL)   GLUCOSE, CAPILLARY     Status: Abnormal   Collection Time   12/27/11  5:07 PM      Component Value Range Comment   Glucose-Capillary 287 (*) 70 - 99 (mg/dL)   GLUCOSE, CAPILLARY     Status: Abnormal   Collection Time   12/27/11  8:49 PM      Component Value Range Comment   Glucose-Capillary 322 (*) 70 - 99 (mg/dL)   CBC     Status: Abnormal   Collection Time   12/28/11  5:50 AM      Component Value Range Comment   WBC 4.3  4.0 - 10.5 (K/uL)    RBC 3.03 (*) 4.22 - 5.81 (MIL/uL)    Hemoglobin 9.1 (*) 13.0 - 17.0 (g/dL)    HCT 04.5 (*) 40.9 - 52.0 (%)    MCV 90.1  78.0 - 100.0 (fL)    MCH 30.0  26.0 - 34.0 (pg)    MCHC 33.3  30.0 - 36.0 (g/dL)    RDW 81.1  91.4 - 78.2 (%)    Platelets 151  150 - 400 (K/uL)   BASIC METABOLIC PANEL     Status: Abnormal   Collection Time   12/28/11   5:50 AM      Component Value Range Comment   Sodium 136  135 - 145 (mEq/L)    Potassium 4.0  3.5 - 5.1 (mEq/L)    Chloride 101  96 - 112 (mEq/L)    CO2 25  19 - 32 (mEq/L)    Glucose, Bld 258 (*) 70 - 99 (mg/dL)    BUN 17  6 - 23 (mg/dL)    Creatinine, Ser 9.56  0.50 - 1.35 (mg/dL)    Calcium 8.4  8.4 - 10.5 (mg/dL)    GFR calc non Af Amer >90  >90 (mL/min)    GFR calc Af Amer >90  >90 (mL/min)   GLUCOSE, CAPILLARY     Status: Abnormal   Collection Time   12/28/11  7:49 AM      Component Value Range Comment   Glucose-Capillary 140 (*) 70 - 99 (mg/dL)    Comment 1 Documented in Chart      Comment 2 Notify RN     GLUCOSE, CAPILLARY     Status: Abnormal  Collection Time   12/28/11 11:35 AM      Component Value Range Comment   Glucose-Capillary 113 (*) 70 - 99 (mg/dL)    Comment 1 Documented in Chart      Comment 2 Notify RN     SEDIMENTATION RATE     Status: Abnormal   Collection Time   12/28/11  4:11 PM      Component Value Range Comment   Sed Rate 70 (*) 0 - 16 (mm/hr)   GLUCOSE, CAPILLARY     Status: Abnormal   Collection Time   12/28/11  4:58 PM      Component Value Range Comment   Glucose-Capillary 190 (*) 70 - 99 (mg/dL)    Comment 1 Documented in Chart      Comment 2 Notify RN     HIV ANTIBODY (ROUTINE TESTING)     Status: Normal   Collection Time   12/28/11  7:15 PM      Component Value Range Comment   HIV NON REACTIVE  NON REACTIVE    GLUCOSE, CAPILLARY     Status: Abnormal   Collection Time   12/28/11  9:06 PM      Component Value Range Comment   Glucose-Capillary 137 (*) 70 - 99 (mg/dL)   GLUCOSE, CAPILLARY     Status: Abnormal   Collection Time   12/29/11  8:15 AM      Component Value Range Comment   Glucose-Capillary 60 (*) 70 - 99 (mg/dL)   GLUCOSE, CAPILLARY     Status: Normal   Collection Time   12/29/11  9:02 AM      Component Value Range Comment   Glucose-Capillary 98  70 - 99 (mg/dL)     No results found.  Review of Systems  Constitutional: Positive  for fever.       Admitted fever and infection left great toe  Cardiovascular: Negative.   Skin:       Left toe medial draining wound  Positive MRSA  Neurological: Positive for tingling.       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 rate 20, height 6\' 3"  (1.905 m), weight 100.699 kg (222 lb), SpO2 97.00%. Physical Exam  Constitutional: He appears well-developed.  Eyes: Pupils are equal, round, and reactive to light.  Neck: Normal range of motion.  Respiratory: Effort normal.  GI: Soft.  Musculoskeletal:       Left great toe 3 mm draining medial wound at IP joint with purulence. Toe swollen  Psychiatric: He has a normal mood and affect. His behavior is normal.    Assessment/Plan: Left great toe osteomyelitis . Discussed procedure, risks of nonhealing, possible more proximal amputation in future. He has right toe amputated in past for infection. All ?'s answered he understands and agrees to proceed.   Kue Fox C 12/29/2011, 10:10 AM

## 2011-12-29 NOTE — Progress Notes (Signed)
PATIENT DETAILS Name: Caleb Joseph Age: 56 y.o. Sex: male Date of Birth: 1956/05/18 Admit Date: 12/26/2011 ZOX:[id number],JWJXBJYNW, MD, MD  Subjective: No major complaints-now agreeable to proceed with amputation  Objective: Vital signs in last 24 hours: Filed Vitals:   12/28/11 2108 12/29/11 0423 12/29/11 1144 12/29/11 1322  BP: 114/67 119/64 114/70 133/73  Pulse: 72 78 76 72  Temp: 99.1 F (37.3 C) 98.6 F (37 C) 98.2 F (36.8 C) 98.5 F (36.9 C)  TempSrc: Oral Oral Oral Oral  Resp: 20 20 18 19   Height:      Weight:      SpO2: 96% 97% 97% 99%    Weight change:   Body mass index is 27.75 kg/(m^2).  Intake/Output from previous day:  Intake/Output Summary (Last 24 hours) at 12/29/11 1339 Last data filed at 12/29/11 1300  Gross per 24 hour  Intake   1323 ml  Output      0 ml  Net   1323 ml    PHYSICAL EXAM: Gen Exam: Awake and alert with clear speech.   Neck: Supple, No JVD.   Chest: B/L Clear.   CVS: S1 S2 Regular, no murmurs.  Abdomen: soft, BS +, non tender, non distended.  Extremities: no edema, lower extremities warm to touch.Left great toe-swollen with mild erythema, swelling and open ulcer Neurologic: Non Focal.  Skin: No Rash.   Wounds: N/A.    CONSULTS:  orthopedic surgery  LAB RESULTS: CBC  Lab 12/28/11 0550 12/27/11 0600 12/26/11 2113 12/26/11 1431  WBC 4.3 6.7 6.9 6.2  HGB 9.1* 10.1* 10.1* 9.8*  HCT 27.3* 30.2* 29.4* 28.6*  PLT 151 167 137* 132*  MCV 90.1 88.3 87.8 87.7  MCH 30.0 29.5 30.1 30.1  MCHC 33.3 33.4 34.4 34.3  RDW 13.2 13.1 13.1 13.1  LYMPHSABS -- -- -- 1.4  MONOABS -- -- -- 0.6  EOSABS -- -- -- 0.1  BASOSABS -- -- -- 0.0  BANDABS -- -- -- --    Chemistries   Lab 12/28/11 0550 12/27/11 0600 12/26/11 2113 12/26/11 1431  NA 136 139 -- 132*  K 4.0 4.3 3.5 5.7*  CL 101 100 -- 97  CO2 25 28 -- 26  GLUCOSE 258* 92 -- 356*  BUN 17 22 -- 27*  CREATININE 0.92 1.27 1.54* 1.61*  CALCIUM 8.4 9.0 -- 9.2  MG -- -- -- --     GFR Estimated Creatinine Clearance: 107.2 ml/min (by C-G formula based on Cr of 0.92).  Coagulation profile  Lab 12/26/11 2113  INR 1.16  PROTIME --    Cardiac Enzymes No results found for this basename: CK:3,CKMB:3,TROPONINI:3,MYOGLOBIN:3 in the last 168 hours  No components found with this basename: POCBNP:3 No results found for this basename: DDIMER:2 in the last 72 hours  Basename 12/26/11 1627  HGBA1C 11.7*   No results found for this basename: CHOL:2,HDL:2,LDLCALC:2,TRIG:2,CHOLHDL:2,LDLDIRECT:2 in the last 72 hours No results found for this basename: TSH,T4TOTAL,FREET3,T3FREE,THYROIDAB in the last 72 hours No results found for this basename: VITAMINB12:2,FOLATE:2,FERRITIN:2,TIBC:2,IRON:2,RETICCTPCT:2 in the last 72 hours No results found for this basename: LIPASE:2,AMYLASE:2 in the last 72 hours  Urine Studies No results found for this basename: UACOL:2,UAPR:2,USPG:2,UPH:2,UTP:2,UGL:2,UKET:2,UBIL:2,UHGB:2,UNIT:2,UROB:2,ULEU:2,UEPI:2,UWBC:2,URBC:2,UBAC:2,CAST:2,CRYS:2,UCOM:2,BILUA:2 in the last 72 hours  MICROBIOLOGY: Recent Results (from the past 240 hour(s))  CULTURE, BLOOD (ROUTINE X 2)     Status: Normal (Preliminary result)   Collection Time   12/26/11  3:50 PM      Component Value Range Status Comment   Specimen Description BLOOD ARM RIGHT  Final    Special Requests BOTTLES DRAWN AEROBIC AND ANAEROBIC B 10CC R 7CC   Final    Culture  Setup Time [id number]   Final    Culture     Final    Value:        BLOOD CULTURE RECEIVED NO GROWTH TO DATE CULTURE WILL BE HELD FOR 5 DAYS BEFORE ISSUING A FINAL NEGATIVE REPORT   Report Status PENDING   Incomplete   CULTURE, BLOOD (ROUTINE X 2)     Status: Normal (Preliminary result)   Collection Time   12/26/11  4:27 PM      Component Value Range Status Comment   Specimen Description BLOOD HAND RIGHT   Final    Special Requests BOTTLES DRAWN AEROBIC ONLY 10CC   Final    Culture  Setup Time [id number]   Final     Culture     Final    Value:        BLOOD CULTURE RECEIVED NO GROWTH TO DATE CULTURE WILL BE HELD FOR 5 DAYS BEFORE ISSUING A FINAL NEGATIVE REPORT   Report Status PENDING   Incomplete   MRSA PCR SCREENING     Status: Normal   Collection Time   12/26/11  6:58 PM      Component Value Range Status Comment   MRSA by PCR NEGATIVE  NEGATIVE  Final     RADIOLOGY STUDIES/RESULTS: Dg Foot Complete Left  12/26/2011  *RADIOLOGY REPORT*  Clinical Data: Great toe infection.  Ulceration.  LEFT FOOT - COMPLETE 3+ VIEW  Comparison: 11/18/2011.  Findings: Diabetic type small vessel atherosclerotic calcifications.  Soft tissue swelling is present over the great toe.  There is osteolysis of the volar aspect of the proximal phalanx compatible with active osteomyelitis.  Rare faction of bone is seen on both the frontal and oblique views.  IMPRESSION: Proximal aspect terminal phalanx great toe active osteomyelitis.  Original Report Authenticated By: Andreas Newport, M.D.    MEDICATIONS: Scheduled Meds:    . cholecalciferol  1,000 Units Oral BID  . divalproex  1,000 mg Oral QHS  . DULoxetine  30 mg Oral Daily  . gabapentin  600 mg Oral Daily  . heparin  5,000 Units Subcutaneous Q8H  . insulin aspart  0-15 Units Subcutaneous TID WC  . insulin aspart  0-5 Units Subcutaneous QHS  . insulin aspart  4 Units Subcutaneous TID WC  . insulin glargine  65 Units Subcutaneous Daily  . meropenem (MERREM) IV  1 g Intravenous Q8H  . simvastatin  20 mg Oral Daily  . sodium chloride  3 mL Intravenous Q12H  . vancomycin  1,000 mg Intravenous Q12H  . DISCONTD: insulin glargine  60 Units Subcutaneous Daily   Continuous Infusions:  PRN Meds:.albuterol, guaiFENesin-dextromethorphan, HYDROcodone-acetaminophen, ondansetron (ZOFRAN) IV, ondansetron  Antibiotics: Anti-infectives     Start     Dose/Rate Route Frequency Ordered Stop   12/27/11 1630   vancomycin (VANCOCIN) 1,500 mg in sodium chloride 0.9 % 500 mL IVPB  Status:   Discontinued        1,500 mg 250 mL/hr over 120 Minutes Intravenous Every 24 hours 12/26/11 1616 12/27/11 1301   12/27/11 1600   vancomycin (VANCOCIN) IVPB 1000 mg/200 mL premix        1,000 mg 200 mL/hr over 60 Minutes Intravenous Every 12 hours 12/27/11 1302     12/27/11 1400   meropenem (MERREM) 1 g in sodium chloride 0.9 % 100 mL IVPB  1 g 200 mL/hr over 30 Minutes Intravenous 3 times per day 12/27/11 1303     12/26/11 1700   meropenem (MERREM) 1 g in sodium chloride 0.9 % 100 mL IVPB  Status:  Discontinued        1 g 200 mL/hr over 30 Minutes Intravenous Every 12 hours 12/26/11 1617 12/27/11 1303   12/26/11 1630   vancomycin (VANCOCIN) 500 mg in sodium chloride 0.9 % 100 mL IVPB        500 mg 100 mL/hr over 60 Minutes Intravenous  Once 12/26/11 1616 12/26/11 1820   12/26/11 1400   vancomycin (VANCOCIN) IVPB 1000 mg/200 mL premix        1,000 mg 200 mL/hr over 60 Minutes Intravenous  Once 12/26/11 1358 12/26/11 1659   12/26/11 1400   piperacillin-tazobactam (ZOSYN) IVPB 3.375 g        3.375 g 12.5 mL/hr over 240 Minutes Intravenous  Once 12/26/11 1358 12/26/11 1916          Assessment/Plan: Patient Active Hospital Problem List: Diabetic foot infection  - afebrile with no leukocytosis -continue with Vancomycin and Meropenem -ABI's  Within normal limits -Appreciate Ortho input-patient now agreeable for amputation -check ESR  Diabetes Mellitus -increase Lantus to 65 units -start scheduled premeal novolog  DIABETIC PERIPHERAL NEUROPATHY  -continue with Neurontin  ARF -resolved with IVF -likely pre-renal-given diabetic foot -Monitor periodically  HYPERLIPIDEMIA  -Continue with Statin  HYPERKALEMIA -resolved   HTN -Antihypertensives held on admission for relative hypotension -BP currently controlled  -monitor off BP meds for now  ANEMIA -likely secondary to chronic disease -monitor  Disposition: Remain inpatient-for amputation likely  tomorrow  DVT Prophylaxis: Subcutaneous Heparin  Code Status: Full Code  Maretta Bees,  MD. 12/29/2011, 1:39 PM

## 2011-12-29 NOTE — Progress Notes (Signed)
CBG: 60  Treatment:  breakfast  Symptoms:  none  Follow-up CBG: Time: 0900 CBG Result: 98  Possible Reasons for Event:  none  Comments/MD notified: no    Babacar Haycraft, Charlann Noss

## 2011-12-30 ENCOUNTER — Inpatient Hospital Stay (HOSPITAL_COMMUNITY): Payer: Medicaid Other | Admitting: Anesthesiology

## 2011-12-30 ENCOUNTER — Encounter (HOSPITAL_COMMUNITY): Payer: Self-pay | Admitting: Anesthesiology

## 2011-12-30 ENCOUNTER — Encounter (HOSPITAL_COMMUNITY)
Admission: EM | Disposition: A | Discharge status: Home / Self Care | Payer: Self-pay | Source: Home / Self Care | Attending: Internal Medicine

## 2011-12-30 ENCOUNTER — Other Ambulatory Visit: Payer: Self-pay | Admitting: Orthopaedic Surgery

## 2011-12-30 HISTORY — PX: AMPUTATION: SHX166

## 2011-12-30 LAB — GLUCOSE, CAPILLARY: Glucose-Capillary: 93 mg/dL (ref 70–99)

## 2011-12-30 SURGERY — AMPUTATION DIGIT
Anesthesia: General | Site: Toe | Laterality: Left | Wound class: Dirty or Infected

## 2011-12-30 MED ORDER — LACTATED RINGERS IV SOLN
INTRAVENOUS | Status: DC | PRN
  Administered 2011-12-30: 07:00:00 via INTRAVENOUS

## 2011-12-30 MED ORDER — GABAPENTIN 300 MG PO CAPS: 600.0000 mg | ORAL_CAPSULE | Freq: Every day | ORAL | Status: DC

## 2011-12-30 MED ORDER — SUFENTANIL CITRATE 50 MCG/ML IV SOLN
INTRAVENOUS | Status: DC | PRN
  Administered 2011-12-30 (×2): 5 ug via INTRAVENOUS

## 2011-12-30 MED ORDER — PROMETHAZINE HCL 25 MG/ML IJ SOLN: 6.2500 mg | INTRAMUSCULAR | Status: DC | PRN

## 2011-12-30 MED ORDER — MIDAZOLAM HCL 5 MG/5ML IJ SOLN
INTRAMUSCULAR | Status: DC | PRN
  Administered 2011-12-30: 1 mg via INTRAVENOUS

## 2011-12-30 MED ORDER — DULOXETINE HCL 30 MG PO CPEP: 30.0000 mg | ORAL_CAPSULE | Freq: Every day | ORAL | Status: DC

## 2011-12-30 MED ORDER — MORPHINE SULFATE 2 MG/ML IJ SOLN: 1.0000 mg | INTRAMUSCULAR | Status: DC | PRN

## 2011-12-30 MED ORDER — PROPOFOL 10 MG/ML IV EMUL
INTRAVENOUS | Status: DC | PRN
  Administered 2011-12-30: 200 mg via INTRAVENOUS
  Administered 2011-12-30: 20 mg via INTRAVENOUS

## 2011-12-30 MED ORDER — MEPERIDINE HCL 25 MG/ML IJ SOLN: 6.2500 mg | INTRAMUSCULAR | Status: DC | PRN

## 2011-12-30 MED ORDER — 0.9 % SODIUM CHLORIDE (POUR BTL) OPTIME
TOPICAL | Status: DC | PRN
  Administered 2011-12-30: 1000 mL

## 2011-12-30 MED ORDER — DIVALPROEX SODIUM ER 500 MG PO TB24: 1000.0000 mg | ORAL_TABLET | Freq: Every day | ORAL | Status: DC

## 2011-12-30 SURGICAL SUPPLY — 55 items
APL SKNCLS STERI-STRIP NONHPOA (GAUZE/BANDAGES/DRESSINGS)
BANDAGE CONFORM 2  STR LF (GAUZE/BANDAGES/DRESSINGS) IMPLANT
BANDAGE CONFORM 3  STR LF (GAUZE/BANDAGES/DRESSINGS) IMPLANT
BANDAGE ELASTIC 4 VELCRO ST LF (GAUZE/BANDAGES/DRESSINGS) IMPLANT
BANDAGE GAUZE ELAST BULKY 4 IN (GAUZE/BANDAGES/DRESSINGS) ×2 IMPLANT
BENZOIN TINCTURE PRP APPL 2/3 (GAUZE/BANDAGES/DRESSINGS) IMPLANT
BLADE SURG ROTATE 9660 (MISCELLANEOUS) IMPLANT
BNDG CMPR MD 5X2 ELC HKLP STRL (GAUZE/BANDAGES/DRESSINGS)
BNDG COHESIVE 1X5 TAN STRL LF (GAUZE/BANDAGES/DRESSINGS) IMPLANT
BNDG ELASTIC 2 VLCR STRL LF (GAUZE/BANDAGES/DRESSINGS) IMPLANT
CLOTH BEACON ORANGE TIMEOUT ST (SAFETY) ×2 IMPLANT
CORDS BIPOLAR (ELECTRODE) IMPLANT
COVER SURGICAL LIGHT HANDLE (MISCELLANEOUS) ×2 IMPLANT
CUFF TOURNIQUET SINGLE 18IN (TOURNIQUET CUFF) IMPLANT
CUFF TOURNIQUET SINGLE 24IN (TOURNIQUET CUFF) IMPLANT
DRSG EMULSION OIL 3X3 NADH (GAUZE/BANDAGES/DRESSINGS) IMPLANT
DURAPREP 26ML APPLICATOR (WOUND CARE) ×2 IMPLANT
ELECT REM PT RETURN 9FT ADLT (ELECTROSURGICAL) ×2
ELECTRODE REM PT RTRN 9FT ADLT (ELECTROSURGICAL) ×1 IMPLANT
GAUZE SPONGE 2X2 8PLY STRL LF (GAUZE/BANDAGES/DRESSINGS) IMPLANT
GAUZE SPONGE 4X4 12PLY STRL LF (GAUZE/BANDAGES/DRESSINGS) ×2 IMPLANT
GAUZE XEROFORM 1X8 LF (GAUZE/BANDAGES/DRESSINGS) ×2 IMPLANT
GLOVE BIOGEL PI IND STRL 7.5 (GLOVE) ×1 IMPLANT
GLOVE BIOGEL PI IND STRL 8 (GLOVE) ×1 IMPLANT
GLOVE BIOGEL PI INDICATOR 7.5 (GLOVE) ×1
GLOVE BIOGEL PI INDICATOR 8 (GLOVE) ×1
GLOVE ECLIPSE 7.0 STRL STRAW (GLOVE) ×2 IMPLANT
GLOVE ORTHO TXT STRL SZ7.5 (GLOVE) ×2 IMPLANT
GOWN PREVENTION PLUS LG XLONG (DISPOSABLE) IMPLANT
GOWN STRL NON-REIN LRG LVL3 (GOWN DISPOSABLE) ×4 IMPLANT
KIT BASIN OR (CUSTOM PROCEDURE TRAY) ×2 IMPLANT
KIT ROOM TURNOVER OR (KITS) ×2 IMPLANT
MANIFOLD NEPTUNE II (INSTRUMENTS) ×2 IMPLANT
NS IRRIG 1000ML POUR BTL (IV SOLUTION) ×2 IMPLANT
PACK ORTHO EXTREMITY (CUSTOM PROCEDURE TRAY) ×2 IMPLANT
PAD ARMBOARD 7.5X6 YLW CONV (MISCELLANEOUS) ×2 IMPLANT
SPECIMEN JAR SMALL (MISCELLANEOUS) ×2 IMPLANT
SPONGE GAUZE 2X2 STER 10/PKG (GAUZE/BANDAGES/DRESSINGS)
SPONGE GAUZE 4X4 12PLY (GAUZE/BANDAGES/DRESSINGS) ×2 IMPLANT
STRIP CLOSURE SKIN 1/2X4 (GAUZE/BANDAGES/DRESSINGS) IMPLANT
SUCTION FRAZIER TIP 10 FR DISP (SUCTIONS) IMPLANT
SUT ETHIBOND 4 0 TF (SUTURE) IMPLANT
SUT ETHIBOND 5 0 P 3 (SUTURE)
SUT ETHILON 2 0 PSLX (SUTURE) ×2 IMPLANT
SUT ETHILON 4 0 P 3 18 (SUTURE) IMPLANT
SUT ETHILON 5 0 P 3 18 (SUTURE)
SUT MERSILENE 6 0 S14 DA (SUTURE) IMPLANT
SUT NYLON ETHILON 5-0 P-3 1X18 (SUTURE) IMPLANT
SUT POLY ETHIBOND 5-0 P-3 1X18 (SUTURE) IMPLANT
SUT PROLENE 4 0 P 3 18 (SUTURE) IMPLANT
SUT SILK 4 0 PS 2 (SUTURE) IMPLANT
TOWEL OR 17X24 6PK STRL BLUE (TOWEL DISPOSABLE) ×2 IMPLANT
TOWEL OR 17X26 10 PK STRL BLUE (TOWEL DISPOSABLE) ×2 IMPLANT
TUBE CONNECTING 12X1/4 (SUCTIONS) ×2 IMPLANT
WATER STERILE IRR 1000ML POUR (IV SOLUTION) IMPLANT

## 2011-12-30 NOTE — Progress Notes (Signed)
ANTIBIOTIC CONSULT NOTE - FOLLOW UP  Pharmacy Consult for Merrem and Vancomycin Indication: L great toe osteomylitis  No Known Allergies  Patient Measurements: Height: 6\' 3"  (190.5 cm) Weight: 236 lb 5.3 oz (107.2 kg) IBW/kg (Calculated) : 84.5   Vital Signs: Temp: 98.2 F (36.8 C) (02/03 1327) Temp src: Oral (02/03 1327) BP: 113/77 mmHg (02/03 1327) Pulse Rate: 76  (02/03 1327) Intake/Output from previous day: 02/02 0701 - 02/03 0700 In: 1623 [P.O.:1320; I.V.:3; IV Piggyback:300] Out: -  Intake/Output from this shift: Total I/O In: 1180 [P.O.:480; I.V.:700] Out: 0   Labs:  [facility] 12/28/11 0550  WBC 4.3  HGB 9.1*  PLT 151  LABCREA --  CREATININE 0.92   Estimated Creatinine Clearance: 118.7 ml/min (by C-G formula based on Cr of 0.92).   Microbiology: Recent Results (from the past 720 hour(s))  CULTURE, BLOOD (ROUTINE X 2)     Status: Normal (Preliminary result)   Collection Time   12/26/11  3:50 PM      Component Value Range Status Comment   Specimen Description BLOOD ARM RIGHT   Final    Special Requests BOTTLES DRAWN AEROBIC AND ANAEROBIC B 10CC R 7CC   Final    Culture  Setup Time [id number]   Final    Culture     Final    Value:        BLOOD CULTURE RECEIVED NO GROWTH TO DATE CULTURE WILL BE HELD FOR 5 DAYS BEFORE ISSUING A FINAL NEGATIVE REPORT   Report Status PENDING   Incomplete   CULTURE, BLOOD (ROUTINE X 2)     Status: Normal (Preliminary result)   Collection Time   12/26/11  4:27 PM      Component Value Range Status Comment   Specimen Description BLOOD HAND RIGHT   Final    Special Requests BOTTLES DRAWN AEROBIC ONLY 10CC   Final    Culture  Setup Time [id number]   Final    Culture     Final    Value:        BLOOD CULTURE RECEIVED NO GROWTH TO DATE CULTURE WILL BE HELD FOR 5 DAYS BEFORE ISSUING A FINAL NEGATIVE REPORT   Report Status PENDING   Incomplete   MRSA PCR SCREENING     Status: Normal   Collection Time   12/26/11  6:58 PM   Component Value Range Status Comment   MRSA by PCR NEGATIVE  NEGATIVE  Final     Anti-infectives     Start     Dose/Rate Route Frequency Ordered Stop   12/27/11 1630   vancomycin (VANCOCIN) 1,500 mg in sodium chloride 0.9 % 500 mL IVPB  Status:  Discontinued        1,500 mg 250 mL/hr over 120 Minutes Intravenous Every 24 hours 12/26/11 1616 12/27/11 1301   12/27/11 1600   vancomycin (VANCOCIN) IVPB 1000 mg/200 mL premix        1,000 mg 200 mL/hr over 60 Minutes Intravenous Every 12 hours 12/27/11 1302     12/27/11 1400   meropenem (MERREM) 1 g in sodium chloride 0.9 % 100 mL IVPB        1 g 200 mL/hr over 30 Minutes Intravenous 3 times per day 12/27/11 1303     12/26/11 1700   meropenem (MERREM) 1 g in sodium chloride 0.9 % 100 mL IVPB  Status:  Discontinued        1 g 200 mL/hr over 30 Minutes Intravenous Every 12 hours 12/26/11  1617 12/27/11 1303   12/26/11 1630   vancomycin (VANCOCIN) 500 mg in sodium chloride 0.9 % 100 mL IVPB        500 mg 100 mL/hr over 60 Minutes Intravenous  Once 12/26/11 1616 12/26/11 1820   12/26/11 1400   vancomycin (VANCOCIN) IVPB 1000 mg/200 mL premix        1,000 mg 200 mL/hr over 60 Minutes Intravenous  Once 12/26/11 1358 12/26/11 1659   12/26/11 1400   piperacillin-tazobactam (ZOSYN) IVPB 3.375 g        3.375 g 12.5 mL/hr over 240 Minutes Intravenous  Once 12/26/11 1358 12/26/11 1916          Assessment: 56 yo M admitted with worsening L great toe infection, osteo confirmed, now s/p toe amputation.  Pt on D#5 Meropenem and Vancomycin.   Renal function has improved -CrCl 118 ml/min.  Doses abx appropriate.  Pt afebrile, WBC WNL.     Goal of Therapy:  Vancomycin trough level 15-20 mcg/ml  Plan: 1. Continue Vanc and meropenem at current doses.   2. F/u duration of therapy.  3. Follow up renal funcation and culture data.   Haynes Hoehn, PharmD 12/30/2011 2:26 PM  Pager: [phone]

## 2011-12-30 NOTE — Brief Op Note (Addendum)
12/26/2011 - 12/30/2011  8:28 AM  PATIENT:  Caleb Joseph  56 y.o. male  PRE-OPERATIVE DIAGNOSIS:  lt great toe osteomyelitis  POST-OPERATIVE DIAGNOSIS:  Left great toe osteomyelitis  PROCEDURE:  Procedure(s): AMPUTATION LEFT GREAT TOE  SURGEON:  Surgeon(s): Eldred Manges, MD  PHYSICIAN ASSISTANT:   ASSISTANTS: none   ANESTHESIA:   General LMA  EBL:   minimal  BLOOD ADMINISTERED:none  DRAINS: none   LOCAL MEDICATIONS USED:  NONE  SPECIMEN:  Source of Specimen:  toe  DISPOSITION OF SPECIMEN:  PATHOLOGY  COUNTS:  YES  TOURNIQUET:   Total Tourniquet Time Documented: Calf (Left) - 8 minutes  DICTATION: .Other Dictation: Dictation Number [id number]  PLAN OF CARE: still inpatient  PATIENT DISPOSITION:  PACU - hemodynamically stable.   Delay start of Pharmacological VTE agent (>24hrs) due to surgical blood loss or risk of bleeding:  {YES/NO/NOT APPLICABLE:20182

## 2011-12-30 NOTE — Progress Notes (Signed)
PATIENT DETAILS Name: Caleb Joseph Age: 56 y.o. Sex: male Date of Birth: 05/06/1956 Admit Date: 12/26/2011 ZOX:[id number],JWJXBJYNW, MD, MD  Subjective: Status post left great toe amputation today  Objective: Vital signs in last 24 hours: Filed Vitals:   12/30/11 0911 12/30/11 0912 12/30/11 0913 12/30/11 1008  BP:    133/79  Pulse: 69 70 69 70  Temp:   97.8 F (36.6 C) 97.6 F (36.4 C)  TempSrc:    Oral  Resp: 17 15 15 18   Height:      Weight:      SpO2: 99% 99% 98% 99%    Weight change:   Body mass index is 29.54 kg/(m^2).  Intake/Output from previous day:  Intake/Output Summary (Last 24 hours) at 12/30/11 1201 Last data filed at 12/30/11 1009  Gross per 24 hour  Intake   2200 ml  Output      0 ml  Net   2200 ml    PHYSICAL EXAM: Gen Exam: Awake and alert with clear speech.   Neck: Supple, No JVD.   Chest: B/L Clear.   CVS: S1 S2 Regular, no murmurs.  Abdomen: soft, BS +, non tender, non distended.  Extremities: no edema, lower extremities warm to touch.Left foot bandaged Neurologic: Non Focal.  Skin: No Rash.   Wounds: N/A.    CONSULTS:  orthopedic surgery  LAB RESULTS: CBC  Lab 12/28/11 0550 12/27/11 0600 12/26/11 2113 12/26/11 1431  WBC 4.3 6.7 6.9 6.2  HGB 9.1* 10.1* 10.1* 9.8*  HCT 27.3* 30.2* 29.4* 28.6*  PLT 151 167 137* 132*  MCV 90.1 88.3 87.8 87.7  MCH 30.0 29.5 30.1 30.1  MCHC 33.3 33.4 34.4 34.3  RDW 13.2 13.1 13.1 13.1  LYMPHSABS -- -- -- 1.4  MONOABS -- -- -- 0.6  EOSABS -- -- -- 0.1  BASOSABS -- -- -- 0.0  BANDABS -- -- -- --    Chemistries   Lab 12/28/11 0550 12/27/11 0600 12/26/11 2113 12/26/11 1431  NA 136 139 -- 132*  K 4.0 4.3 3.5 5.7*  CL 101 100 -- 97  CO2 25 28 -- 26  GLUCOSE 258* 92 -- 356*  BUN 17 22 -- 27*  CREATININE 0.92 1.27 1.54* 1.61*  CALCIUM 8.4 9.0 -- 9.2  MG -- -- -- --    GFR Estimated Creatinine Clearance: 118.7 ml/min (by C-G formula based on Cr of 0.92).  Coagulation profile  Lab  12/26/11 2113  INR 1.16  PROTIME --    Cardiac Enzymes No results found for this basename: CK:3,CKMB:3,TROPONINI:3,MYOGLOBIN:3 in the last 168 hours  No components found with this basename: POCBNP:3 No results found for this basename: DDIMER:2 in the last 72 hours No results found for this basename: HGBA1C:2 in the last 72 hours No results found for this basename: CHOL:2,HDL:2,LDLCALC:2,TRIG:2,CHOLHDL:2,LDLDIRECT:2 in the last 72 hours No results found for this basename: TSH,T4TOTAL,FREET3,T3FREE,THYROIDAB in the last 72 hours No results found for this basename: VITAMINB12:2,FOLATE:2,FERRITIN:2,TIBC:2,IRON:2,RETICCTPCT:2 in the last 72 hours No results found for this basename: LIPASE:2,AMYLASE:2 in the last 72 hours  Urine Studies No results found for this basename: UACOL:2,UAPR:2,USPG:2,UPH:2,UTP:2,UGL:2,UKET:2,UBIL:2,UHGB:2,UNIT:2,UROB:2,ULEU:2,UEPI:2,UWBC:2,URBC:2,UBAC:2,CAST:2,CRYS:2,UCOM:2,BILUA:2 in the last 72 hours  MICROBIOLOGY: Recent Results (from the past 240 hour(s))  CULTURE, BLOOD (ROUTINE X 2)     Status: Normal (Preliminary result)   Collection Time   12/26/11  3:50 PM      Component Value Range Status Comment   Specimen Description BLOOD ARM RIGHT   Final    Special Requests BOTTLES DRAWN AEROBIC AND ANAEROBIC  Julieanne Manson [facility]   Final    Culture  Setup Time [id number]   Final    Culture     Final    Value:        BLOOD CULTURE RECEIVED NO GROWTH TO DATE CULTURE WILL BE HELD FOR 5 DAYS BEFORE ISSUING A FINAL NEGATIVE REPORT   Report Status PENDING   Incomplete   CULTURE, BLOOD (ROUTINE X 2)     Status: Normal (Preliminary result)   Collection Time   12/26/11  4:27 PM      Component Value Range Status Comment   Specimen Description BLOOD HAND RIGHT   Final    Special Requests BOTTLES DRAWN AEROBIC ONLY 10CC   Final    Culture  Setup Time [id number]   Final    Culture     Final    Value:        BLOOD CULTURE RECEIVED NO GROWTH TO DATE CULTURE WILL BE HELD FOR 5  DAYS BEFORE ISSUING A FINAL NEGATIVE REPORT   Report Status PENDING   Incomplete   MRSA PCR SCREENING     Status: Normal   Collection Time   12/26/11  6:58 PM      Component Value Range Status Comment   MRSA by PCR NEGATIVE  NEGATIVE  Final     RADIOLOGY STUDIES/RESULTS: Dg Foot Complete Left  12/26/2011  *RADIOLOGY REPORT*  Clinical Data: Great toe infection.  Ulceration.  LEFT FOOT - COMPLETE 3+ VIEW  Comparison: 11/18/2011.  Findings: Diabetic type small vessel atherosclerotic calcifications.  Soft tissue swelling is present over the great toe.  There is osteolysis of the volar aspect of the proximal phalanx compatible with active osteomyelitis.  Rare faction of bone is seen on both the frontal and oblique views.  IMPRESSION: Proximal aspect terminal phalanx great toe active osteomyelitis.  Original Report Authenticated By: Andreas Newport, M.D.    MEDICATIONS: Scheduled Meds:    . cholecalciferol  1,000 Units Oral BID  . divalproex  1,000 mg Oral QHS  . DULoxetine  30 mg Oral Daily  . gabapentin  600 mg Oral Daily  . heparin  5,000 Units Subcutaneous Q8H  . insulin aspart  0-15 Units Subcutaneous TID WC  . insulin aspart  0-5 Units Subcutaneous QHS  . insulin aspart  4 Units Subcutaneous TID WC  . insulin glargine  65 Units Subcutaneous Daily  . meropenem (MERREM) IV  1 g Intravenous Q8H  . simvastatin  20 mg Oral Daily  . sodium chloride  3 mL Intravenous Q12H  . vancomycin  1,000 mg Intravenous Q12H   Continuous Infusions:  PRN Meds:.albuterol, guaiFENesin-dextromethorphan, HYDROcodone-acetaminophen, morphine, ondansetron (ZOFRAN) IV, ondansetron, DISCONTD: 0.9 % irrigation (POUR BTL), DISCONTD: meperidine, DISCONTD: promethazine  Antibiotics: Anti-infectives     Start     Dose/Rate Route Frequency Ordered Stop   12/27/11 1630   vancomycin (VANCOCIN) 1,500 mg in sodium chloride 0.9 % 500 mL IVPB  Status:  Discontinued        1,500 mg 250 mL/hr over 120 Minutes Intravenous  Every 24 hours 12/26/11 1616 12/27/11 1301   12/27/11 1600   vancomycin (VANCOCIN) IVPB 1000 mg/200 mL premix        1,000 mg 200 mL/hr over 60 Minutes Intravenous Every 12 hours 12/27/11 1302     12/27/11 1400   meropenem (MERREM) 1 g in sodium chloride 0.9 % 100 mL IVPB        1 g 200 mL/hr over 30 Minutes Intravenous 3  times per day 12/27/11 1303     12/26/11 1700   meropenem (MERREM) 1 g in sodium chloride 0.9 % 100 mL IVPB  Status:  Discontinued        1 g 200 mL/hr over 30 Minutes Intravenous Every 12 hours 12/26/11 1617 12/27/11 1303   12/26/11 1630   vancomycin (VANCOCIN) 500 mg in sodium chloride 0.9 % 100 mL IVPB        500 mg 100 mL/hr over 60 Minutes Intravenous  Once 12/26/11 1616 12/26/11 1820   12/26/11 1400   vancomycin (VANCOCIN) IVPB 1000 mg/200 mL premix        1,000 mg 200 mL/hr over 60 Minutes Intravenous  Once 12/26/11 1358 12/26/11 1659   12/26/11 1400   piperacillin-tazobactam (ZOSYN) IVPB 3.375 g        3.375 g 12.5 mL/hr over 240 Minutes Intravenous  Once 12/26/11 1358 12/26/11 1916          Assessment/Plan: Patient Active Hospital Problem List: Diabetic foot infection  -Status post left great toe amputation - afebrile  -continue with Vancomycin and Meropenem -ABI's  Within normal limits  Diabetes Mellitus -increase Lantus to 65 units -start scheduled premeal novolog  DIABETIC PERIPHERAL NEUROPATHY  -continue with Neurontin  ARF -resolved with IVF -likely pre-renal-given diabetic foot -Monitor periodically  HYPERLIPIDEMIA  -Continue with Statin  HYPERKALEMIA -resolved   HTN -Antihypertensives held on admission for relative hypotension -BP currently controlled  -monitor off BP meds for now  ANEMIA -likely secondary to chronic disease -monitor  Disposition: Remain inpatient-for amputation likely tomorrow  DVT Prophylaxis: Subcutaneous Heparin  Code Status: Full Code  Maretta Bees, MD. 12/30/2011, 12:01 PM

## 2011-12-30 NOTE — Transfer of Care (Signed)
Immediate Anesthesia Transfer of Care Note  Patient: Caleb Joseph  Procedure(s) Performed:  AMPUTATION DIGIT - Left Great  Patient Location: PACU  Anesthesia Type: General  Level of Consciousness: awake  Airway & Oxygen Therapy: Patient Spontanous Breathing and Patient connected to nasal cannula oxygen  Post-op Assessment: Report given to PACU RN, Post -op Vital signs reviewed and stable and Patient moving all extremities  Post vital signs: Reviewed and stable  Complications: No apparent anesthesia complications

## 2011-12-30 NOTE — Preoperative (Signed)
Beta Blockers   Reason not to administer Beta Blockers:Not Applicable 

## 2011-12-30 NOTE — Op Note (Signed)
NAMESELMA, RODELO NO.:  [id number]  MEDICAL RECORD NO.:  [id number]  LOCATION:  6715                         FACILITY:  [facility]  PHYSICIAN:  Rishika Mccollom C. Ophelia Charter, M.D.    DATE OF BIRTH:  1956-07-21  DATE OF PROCEDURE:  12/30/2011 DATE OF DISCHARGE:                              OPERATIVE REPORT   PREOPERATIVE DIAGNOSIS:  Left great toe osteomyelitis.  POSTOPERATIVE DIAGNOSIS:  Left great toe osteomyelitis.  PROCEDURE:  Left great toe amputation at metacarpophalangeal joint.  ANESTHESIA:  General, LMA.  TOURNIQUET:  Esmarch less than 30 minutes.  SPECIMENS:  Great toe.  BRIEF HISTORY:  This patient is long-term diabetic, has had a draining wound of the great toe, toe enlargement, spinous infectious disease.  He had been seen by Orthopedics in the past.  Amputation was recommended and he had refused.  X-rays 2 days ago showed osteomyelitis involving the IP joint involving the distal phalanx at the proximal portion.  The patient now decided to proceed with amputation.  PROCEDURE:  After induction of anesthesia, he had been on antibiotics already including vancomycin, LMA tube was placed.  Standard prepping with DuraPrep, extremity sheets and drapes, and Esmarch.  Tourniquet after time-out procedure was applied.  A fishmouth incision was made making the posterior flap little bit larger the anterior flap due to the quality of the skin and the skin was elevated.  Flexor extensors were divided at the MP joint level.  Sesamoids were left.  There was no evidence of infection at the MP joint, just some clear joint fluid. Esmarch was removed.  Hemostasis was obtained.  Digital arteries were cauterized with cautery.  Venous bleeders were controlled.  There was good blood supply to the skin flaps and closure with 2-0 horizontal mattress simple sutures, Xeroform, 4 x 4's and soft dressing was applied with 4 x 4's, Kerlix, and Ace wrap.  The patient tolerated the  procedure well.     Keller Mikels C. Ophelia Charter, M.D.     MCY/MEDQ  D:  12/30/2011  T:  12/30/2011  Job:  [id number]

## 2011-12-30 NOTE — Anesthesia Preprocedure Evaluation (Addendum)
Anesthesia Evaluation  Patient identified by MRN, date of birth, ID band Patient awake    Reviewed: Allergy & Precautions, H&P , NPO status   Airway Mallampati: II TM Distance: >3 FB Neck ROM: Full    Dental  (+) Dental Advisory Given Poor Dentition:   Pulmonary shortness of breath and with exertion,  Smokeless Tobacco use, and snuf use clear to auscultation        Cardiovascular hypertension, Pt. on medications + DOE Regular Normal    Neuro/Psych PSYCHIATRIC DISORDERS Anxiety Depression Diabetic Neuorpathy  Neuromuscular disease    GI/Hepatic   Endo/Other  Diabetes mellitus-, Insulin Dependent  Renal/GU      Musculoskeletal   Abdominal   Peds  Hematology   Anesthesia Other Findings   Reproductive/Obstetrics                         Anesthesia Physical Anesthesia Plan  ASA: III  Anesthesia Plan: General   Post-op Pain Management:    Induction:   Airway Management Planned: Oral ETT  Additional Equipment:   Intra-op Plan:   Post-operative Plan: Extubation in OR  Informed Consent: I have reviewed the patients History and Physical, chart, labs and discussed the procedure including the risks, benefits and alternatives for the proposed anesthesia with the patient or authorized representative who has indicated his/her understanding and acceptance.   Dental advisory given  Plan Discussed with: CRNA and Surgeon  Anesthesia Plan Comments:        Anesthesia Quick Evaluation

## 2011-12-30 NOTE — Anesthesia Postprocedure Evaluation (Signed)
  Anesthesia Post-op Note  Patient: Caleb Joseph  Procedure(s) Performed:  AMPUTATION DIGIT - Left Great  Patient Location: PACU  Anesthesia Type: General  Level of Consciousness: awake  Airway and Oxygen Therapy: Patient Spontanous Breathing  Post-op Pain: mild  Post-op Assessment: Post-op Vital signs reviewed  Post-op Vital Signs: stable  Complications: No apparent anesthesia complications

## 2011-12-31 ENCOUNTER — Encounter (HOSPITAL_COMMUNITY): Payer: Self-pay | Admitting: Orthopaedic Surgery

## 2011-12-31 LAB — CBC
HCT: 28.2 % — ABNORMAL LOW (ref 39.0–52.0)
Hemoglobin: 9.3 g/dL — ABNORMAL LOW (ref 13.0–17.0)
MCV: 90.4 fL (ref 78.0–100.0)
RBC: 3.12 MIL/uL — ABNORMAL LOW (ref 4.22–5.81)
RDW: 13.5 % (ref 11.5–15.5)
WBC: 6.3 10*3/uL (ref 4.0–10.5)

## 2011-12-31 LAB — BASIC METABOLIC PANEL
BUN: 17 mg/dL (ref 6–23)
CO2: 28 mEq/L (ref 19–32)
Chloride: 102 mEq/L (ref 96–112)
Creatinine, Ser: 0.84 mg/dL (ref 0.50–1.35)
GFR calc Af Amer: >90 mL/min (ref >90)
Potassium: 5.5 mEq/L — ABNORMAL HIGH (ref 3.5–5.1)

## 2011-12-31 LAB — GLUCOSE, CAPILLARY
Glucose-Capillary: 55 mg/dL — ABNORMAL LOW (ref 70–99)
Glucose-Capillary: 108 mg/dL — ABNORMAL HIGH (ref 70–99)
Glucose-Capillary: 87 mg/dL (ref 70–99)
Glucose-Capillary: 139 mg/dL — ABNORMAL HIGH (ref 70–99)

## 2011-12-31 MED ORDER — SODIUM POLYSTYRENE SULFONATE 15 GM/60ML PO SUSP
30.0000 g | Freq: Once | ORAL | Status: AC
  Administered 2011-12-31: 30 g via ORAL
  Filled 2011-12-31: qty 120

## 2011-12-31 NOTE — Progress Notes (Signed)
INFECTIOUS DISEASE PROGRESS NOTE  ID: Caleb Joseph is a 56 y.o. male with  Principal Problem:  *Diabetic foot infection Active Problems:  METHICILLIN RESISTANT STAPH AUREUS SEPTICEMIA  DIABETIC PERIPHERAL NEUROPATHY  HYPERLIPIDEMIA  HYPERKALEMIA  ANEMIA  Subjective: Has pain in ankle, not in toe or amputation site.   Abtx:  Anti-infectives     Start     Dose/Rate Route Frequency Ordered Stop   12/27/11 1630   vancomycin (VANCOCIN) 1,500 mg in sodium chloride 0.9 % 500 mL IVPB  Status:  Discontinued        1,500 mg 250 mL/hr over 120 Minutes Intravenous Every 24 hours 12/26/11 1616 12/27/11 1301   12/27/11 1600   vancomycin (VANCOCIN) IVPB 1000 mg/200 mL premix        1,000 mg 200 mL/hr over 60 Minutes Intravenous Every 12 hours 12/27/11 1302     12/27/11 1400   meropenem (MERREM) 1 g in sodium chloride 0.9 % 100 mL IVPB        1 g 200 mL/hr over 30 Minutes Intravenous 3 times per day 12/27/11 1303     12/26/11 1700   meropenem (MERREM) 1 g in sodium chloride 0.9 % 100 mL IVPB  Status:  Discontinued        1 g 200 mL/hr over 30 Minutes Intravenous Every 12 hours 12/26/11 1617 12/27/11 1303   12/26/11 1630   vancomycin (VANCOCIN) 500 mg in sodium chloride 0.9 % 100 mL IVPB        500 mg 100 mL/hr over 60 Minutes Intravenous  Once 12/26/11 1616 12/26/11 1820   12/26/11 1400   vancomycin (VANCOCIN) IVPB 1000 mg/200 mL premix        1,000 mg 200 mL/hr over 60 Minutes Intravenous  Once 12/26/11 1358 12/26/11 1659   12/26/11 1400  piperacillin-tazobactam (ZOSYN) IVPB 3.375 g       3.375 g 12.5 mL/hr over 240 Minutes Intravenous  Once 12/26/11 1358 12/26/11 1916          Medications:  Scheduled:   . cholecalciferol  1,000 Units Oral BID  . divalproex  1,000 mg Oral QHS  . DULoxetine  30 mg Oral Daily  . gabapentin  600 mg Oral Daily  . heparin  5,000 Units Subcutaneous Q8H  . insulin aspart  0-15 Units Subcutaneous TID WC  . insulin aspart  0-5 Units Subcutaneous  QHS  . insulin aspart  4 Units Subcutaneous TID WC  . insulin glargine  65 Units Subcutaneous Daily  . meropenem (MERREM) IV  1 g Intravenous Q8H  . simvastatin  20 mg Oral Daily  . sodium chloride  3 mL Intravenous Q12H  . sodium polystyrene  30 g Oral Once  . vancomycin  1,000 mg Intravenous Q12H    Objective: Vital signs in last 24 hours: Temp:  [97.9 F (36.6 C)-99.2 F (37.3 C)] 99.2 F (37.3 C) (02/04 1400) Pulse Rate:  [72-89] 80  (02/04 1400) Resp:  [17-20] 20  (02/04 1400) BP: (120-141)/(65-77) 129/69 mmHg (02/04 1400) SpO2:  [95 %-100 %] 100 % (02/04 1400) Weight:  [108.364 kg (238 lb 14.4 oz)] 108.364 kg (238 lb 14.4 oz) (02/03 2210)   General appearance: alert, cooperative and no distress Throat: abnormal findings: dentition: poor Extremities: edema trace.  and L foot wrapped  Lab Results  Basename 12/31/11 0715  WBC 6.3  HGB 9.3*  HCT 28.2*  NA 137  K 5.5*  CL 102  CO2 28  BUN 17  CREATININE  0.84  GLU --   Liver Panel No results found for this basename: PROT:2,ALBUMIN:2,AST:2,ALT:2,ALKPHOS:2,BILITOT:2,BILIDIR:2,IBILI:2 in the last 72 hours Sedimentation Rate No results found for this basename: ESRSEDRATE in the last 72 hours C-Reactive Protein No results found for this basename: CRP:2 in the last 72 hours  Microbiology: Recent Results (from the past 240 hour(s))  CULTURE, BLOOD (ROUTINE X 2)     Status: Normal (Preliminary result)   Collection Time   12/26/11  3:50 PM      Component Value Range Status Comment   Specimen Description BLOOD ARM RIGHT   Final    Special Requests BOTTLES DRAWN AEROBIC AND ANAEROBIC B 10CC R 7CC   Final    Culture  Setup Time [id number]   Final    Culture     Final    Value:        BLOOD CULTURE RECEIVED NO GROWTH TO DATE CULTURE WILL BE HELD FOR 5 DAYS BEFORE ISSUING A FINAL NEGATIVE REPORT   Report Status PENDING   Incomplete   CULTURE, BLOOD (ROUTINE X 2)     Status: Normal (Preliminary result)   Collection Time    12/26/11  4:27 PM      Component Value Range Status Comment   Specimen Description BLOOD HAND RIGHT   Final    Special Requests BOTTLES DRAWN AEROBIC ONLY 10CC   Final    Culture  Setup Time [id number]   Final    Culture     Final    Value:        BLOOD CULTURE RECEIVED NO GROWTH TO DATE CULTURE WILL BE HELD FOR 5 DAYS BEFORE ISSUING A FINAL NEGATIVE REPORT   Report Status PENDING   Incomplete   MRSA PCR SCREENING     Status: Normal   Collection Time   12/26/11  6:58 PM      Component Value Range Status Comment   MRSA by PCR NEGATIVE  NEGATIVE  Final     Studies/Results: No results found.   Assessment/Plan: Osteomyelitis, Diabetic Foot Infection Day 5 anbx BCx no growth to date.  S/p amputation 12-30-11. If his margins were felt to be clean, would consider short course of anbx (5 days after amputation). If any question about viability of remaining bone, would consider at least 2 weeks.   Caleb Joseph Infectious Diseases [phone] 12/31/2011, 5:25 PM

## 2011-12-31 NOTE — Progress Notes (Signed)
Orthopedic Tech Progress Note Patient Details:  Caleb Joseph [birth year] [id number]  Other Ortho Devices Type of Ortho Device: CAM walker Ortho Device Location: (L) LE Ortho Device Interventions: Application   Jennye Moccasin 12/31/2011, 3:50 PM

## 2011-12-31 NOTE — Progress Notes (Signed)
Late Entry: 12/27/11  Pt provided with advanced directives and informed to contact CSW when/if ready to get documents notarized.  CSW available if further assistance needed.  Theresia Bough, MSW, Theresia Majors [phone]

## 2011-12-31 NOTE — Progress Notes (Signed)
PATIENT DETAILS Name: Caleb Joseph Age: 56 y.o. Sex: male Date of Birth: 10-29-1956 Admit Date: 12/26/2011 NGE:[email or web address],[email or web address], MD, MD  Subjective: Status post left great toe amputation yesterday-no major events overnight  Objective: Vital signs in last 24 hours: Filed Vitals:   12/30/11 1803 12/30/11 2210 12/31/11 0601 12/31/11 1000  BP: 128/70 121/77 141/75 120/65  Pulse: 72 77 89 85  Temp: 98.7 F (37.1 C) 98.3 F (36.8 C) 97.9 F (36.6 C) 98.6 F (37 C)  TempSrc: Oral Oral Oral Oral  Resp: 17 18 18 18   Height:  6\' 3"  (1.905 m)    Weight:  108.364 kg (238 lb 14.4 oz)    SpO2: 96% 96% 95% 96%    Weight change: 1.164 kg (2 lb 9.1 oz)  Body mass index is 29.86 kg/(m^2).  Intake/Output from previous day:  Intake/Output Summary (Last 24 hours) at 12/31/11 1235 Last data filed at 12/31/11 0900  Gross per 24 hour  Intake    840 ml  Output      0 ml  Net    840 ml    PHYSICAL EXAM: Gen Exam: Awake and alert with clear speech.   Neck: Supple, No JVD.   Chest: B/L Clear.   CVS: S1 S2 Regular, no murmurs.  Abdomen: soft, BS +, non tender, non distended.  Extremities: no edema, lower extremities warm to touch.Left foot bandaged Neurologic: Non Focal.  Skin: No Rash.   Wounds: N/A.    CONSULTS:  orthopedic surgery  LAB RESULTS: CBC  Lab 12/31/11 0715 12/28/11 0550 12/27/11 0600 12/26/11 2113 12/26/11 1431  WBC 6.3 4.3 6.7 6.9 6.2  HGB 9.3* 9.1* 10.1* 10.1* 9.8*  HCT 28.2* 27.3* 30.2* 29.4* 28.6*  PLT 228 151 167 137* 132*  MCV 90.4 90.1 88.3 87.8 87.7  MCH 29.8 30.0 29.5 30.1 30.1  MCHC 33.0 33.3 33.4 34.4 34.3  RDW 13.5 13.2 13.1 13.1 13.1  LYMPHSABS -- -- -- -- 1.4  MONOABS -- -- -- -- 0.6  EOSABS -- -- -- -- 0.1  BASOSABS -- -- -- -- 0.0  BANDABS -- -- -- -- --    Chemistries   Lab 12/31/11 0715 12/28/11 0550 12/27/11 0600 12/26/11 2113 12/26/11 1431  NA 137 136 139 -- 132*  K 5.5* 4.0 4.3 3.5 5.7*  CL 102 101 100 -- 97  CO2 28 25 28  --  26  GLUCOSE 121* 258* 92 -- 356*  BUN 17 17 22  -- 27*  CREATININE 0.84 0.92 1.27 1.54* 1.61*  CALCIUM 9.0 8.4 9.0 -- 9.2  MG -- -- -- -- --    GFR Estimated Creatinine Clearance: 130.7 ml/min (by C-G formula based on Cr of 0.84).  Coagulation profile  Lab 12/26/11 2113  INR 1.16  PROTIME --    Cardiac Enzymes No results found for this basename: CK:3,CKMB:3,TROPONINI:3,MYOGLOBIN:3 in the last 168 hours  No components found with this basename: POCBNP:3 No results found for this basename: DDIMER:2 in the last 72 hours No results found for this basename: HGBA1C:2 in the last 72 hours No results found for this basename: CHOL:2,HDL:2,LDLCALC:2,TRIG:2,CHOLHDL:2,LDLDIRECT:2 in the last 72 hours No results found for this basename: TSH,T4TOTAL,FREET3,T3FREE,THYROIDAB in the last 72 hours No results found for this basename: VITAMINB12:2,FOLATE:2,FERRITIN:2,TIBC:2,IRON:2,RETICCTPCT:2 in the last 72 hours No results found for this basename: LIPASE:2,AMYLASE:2 in the last 72 hours  Urine Studies No results found for this basename: UACOL:2,UAPR:2,USPG:2,UPH:2,UTP:2,UGL:2,UKET:2,UBIL:2,UHGB:2,UNIT:2,UROB:2,ULEU:2,UEPI:2,UWBC:2,URBC:2,UBAC:2,CAST:2,CRYS:2,UCOM:2,BILUA:2 in the last 72 hours  MICROBIOLOGY: Recent Results (from the past 240 hour(s))  CULTURE, BLOOD (ROUTINE  X 2)     Status: Normal (Preliminary result)   Collection Time   12/26/11  3:50 PM      Component Value Range Status Comment   Specimen Description BLOOD ARM RIGHT   Final    Special Requests BOTTLES DRAWN AEROBIC AND ANAEROBIC B 10CC R 7CC   Final    Culture  Setup Time [id number]   Final    Culture     Final    Value:        BLOOD CULTURE RECEIVED NO GROWTH TO DATE CULTURE WILL BE HELD FOR 5 DAYS BEFORE ISSUING A FINAL NEGATIVE REPORT   Report Status PENDING   Incomplete   CULTURE, BLOOD (ROUTINE X 2)     Status: Normal (Preliminary result)   Collection Time   12/26/11  4:27 PM      Component Value Range Status  Comment   Specimen Description BLOOD HAND RIGHT   Final    Special Requests BOTTLES DRAWN AEROBIC ONLY 10CC   Final    Culture  Setup Time [id number]   Final    Culture     Final    Value:        BLOOD CULTURE RECEIVED NO GROWTH TO DATE CULTURE WILL BE HELD FOR 5 DAYS BEFORE ISSUING A FINAL NEGATIVE REPORT   Report Status PENDING   Incomplete   MRSA PCR SCREENING     Status: Normal   Collection Time   12/26/11  6:58 PM      Component Value Range Status Comment   MRSA by PCR NEGATIVE  NEGATIVE  Final     RADIOLOGY STUDIES/RESULTS: Dg Foot Complete Left  12/26/2011  *RADIOLOGY REPORT*  Clinical Data: Great toe infection.  Ulceration.  LEFT FOOT - COMPLETE 3+ VIEW  Comparison: 11/18/2011.  Findings: Diabetic type small vessel atherosclerotic calcifications.  Soft tissue swelling is present over the great toe.  There is osteolysis of the volar aspect of the proximal phalanx compatible with active osteomyelitis.  Rare faction of bone is seen on both the frontal and oblique views.  IMPRESSION: Proximal aspect terminal phalanx great toe active osteomyelitis.  Original Report Authenticated By: Andreas Newport, M.D.    MEDICATIONS: Scheduled Meds:    . cholecalciferol  1,000 Units Oral BID  . divalproex  1,000 mg Oral QHS  . DULoxetine  30 mg Oral Daily  . gabapentin  600 mg Oral Daily  . heparin  5,000 Units Subcutaneous Q8H  . insulin aspart  0-15 Units Subcutaneous TID WC  . insulin aspart  0-5 Units Subcutaneous QHS  . insulin aspart  4 Units Subcutaneous TID WC  . insulin glargine  65 Units Subcutaneous Daily  . meropenem (MERREM) IV  1 g Intravenous Q8H  . simvastatin  20 mg Oral Daily  . sodium chloride  3 mL Intravenous Q12H  . sodium polystyrene  30 g Oral Once  . vancomycin  1,000 mg Intravenous Q12H  . DISCONTD: divalproex  1,000 mg Oral QHS  . DISCONTD: DULoxetine  30 mg Oral QHS  . DISCONTD: gabapentin  600 mg Oral QHS   Continuous Infusions:  PRN Meds:.albuterol,  guaiFENesin-dextromethorphan, HYDROcodone-acetaminophen, ondansetron (ZOFRAN) IV, ondansetron, DISCONTD: morphine  Antibiotics: Anti-infectives     Start     Dose/Rate Route Frequency Ordered Stop   12/27/11 1630   vancomycin (VANCOCIN) 1,500 mg in sodium chloride 0.9 % 500 mL IVPB  Status:  Discontinued        1,500 mg 250 mL/hr over 120 Minutes  Intravenous Every 24 hours 12/26/11 1616 12/27/11 1301   12/27/11 1600   vancomycin (VANCOCIN) IVPB 1000 mg/200 mL premix        1,000 mg 200 mL/hr over 60 Minutes Intravenous Every 12 hours 12/27/11 1302     12/27/11 1400   meropenem (MERREM) 1 g in sodium chloride 0.9 % 100 mL IVPB        1 g 200 mL/hr over 30 Minutes Intravenous 3 times per day 12/27/11 1303     12/26/11 1700   meropenem (MERREM) 1 g in sodium chloride 0.9 % 100 mL IVPB  Status:  Discontinued        1 g 200 mL/hr over 30 Minutes Intravenous Every 12 hours 12/26/11 1617 12/27/11 1303   12/26/11 1630   vancomycin (VANCOCIN) 500 mg in sodium chloride 0.9 % 100 mL IVPB        500 mg 100 mL/hr over 60 Minutes Intravenous  Once 12/26/11 1616 12/26/11 1820   12/26/11 1400   vancomycin (VANCOCIN) IVPB 1000 mg/200 mL premix        1,000 mg 200 mL/hr over 60 Minutes Intravenous  Once 12/26/11 1358 12/26/11 1659   12/26/11 1400   piperacillin-tazobactam (ZOSYN) IVPB 3.375 g        3.375 g 12.5 mL/hr over 240 Minutes Intravenous  Once 12/26/11 1358 12/26/11 1916          Assessment/Plan: Patient Active Hospital Problem List: Diabetic foot infection  -Status post left great toe amputation - afebrile  -continue with Vancomycin and Meropenem till tomorrow am-and then d/c all antibiotics -ABI's  Within normal limits  Diabetes Mellitus -increase Lantus to 65 units -start scheduled premeal novolog  DIABETIC PERIPHERAL NEUROPATHY  -continue with Neurontin  ARF -resolved with IVF -likely pre-renal-given diabetic foot -Monitor periodically  HYPERLIPIDEMIA  -Continue  with Statin  HYPERKALEMIA -kayexalate today  HTN -Antihypertensives held on admission for relative hypotension -BP currently controlled  -monitor off BP meds for now  ANEMIA -likely secondary to chronic disease -monitor  Disposition: Remain inpatient-discharge later today or in am  DVT Prophylaxis: Subcutaneous Heparin  Code Status: Full Code  Maretta Bees, MD. 12/31/2011, 12:35 PM

## 2011-12-31 NOTE — Progress Notes (Addendum)
Inpatient Diabetes Program Recommendations  AACE/ADA: New Consensus Statement on Inpatient Glycemic Control (2009)  Target Ranges:  Prepandial:   less than 140 mg/dL      Peak postprandial:   less than 180 mg/dL (1-2 hours)      Critically ill patients:  140 - 180 mg/dL   Hypoglycemic this morning (CBG 55 mg/dl) after receiving 65 units the night before. Please decrease Lantus dose back to 60 units QHS (this is pt's stated home dose).  Please also consider adding Novolog meal coverage to pt's home regimen as well.  Uncontrolled at home on Lantus and Actos alone (A1C 11.7%- 12/26/11).  Inpatient Diabetes Program Recommendations Insulin - Basal: Please decrease Lantus to 60 units QHS. Insulin - Meal Coverage: Please consider sending pt home on Novolog 4 units tid with meals in addition to his home Lantus dose.  Note: will follow. Ambrose Finland RN, MSN, CDE Diabetes Coordinator Inpatient Diabetes Program [phone]

## 2012-01-01 LAB — CULTURE, BLOOD (ROUTINE X 2)
Culture  Setup Time: 201301302252
Culture: NO GROWTH

## 2012-01-01 LAB — GLUCOSE, CAPILLARY: Glucose-Capillary: 199 mg/dL — ABNORMAL HIGH (ref 70–99)

## 2012-01-01 MED ORDER — ASPIRIN EC 81 MG PO TBEC
81.0000 mg | DELAYED_RELEASE_TABLET | Freq: Every day | ORAL | Status: DC
  Administered 2012-01-01: 81 mg via ORAL
  Filled 2012-01-01: qty 1

## 2012-01-01 MED ORDER — ASPIRIN 81 MG PO TBEC: 81.0000 mg | DELAYED_RELEASE_TABLET | Freq: Every day | ORAL | Status: DC

## 2012-01-01 NOTE — Discharge Summary (Signed)
PATIENT DETAILS Name: Caleb Joseph Age: 56 y.o. Sex: male Date of Birth: [birth year] MRN: [id number]. Admit Date: 12/26/2011 Admitting Physician: Jeoffrey Massed, MD XBM:[id number],NUUVOZDGU, MD, MD  PRIMARY DISCHARGE DIAGNOSIS:  Principal Problem:  *Diabetic foot infection with osteomyelitis Active Problems:  SIR's  DIABETIC PERIPHERAL NEUROPATHY  HYPERLIPIDEMIA  HYPERKALEMIA  ANEMIA  HTN      PAST MEDICAL HISTORY: Past Medical History  Diagnosis Date  . Diabetes mellitus   . 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 HYPERLIPIDEMIA 08/26/2007  . Diabetic foot infection 12/26/2011    DISCHARGE MEDICATIONS: Medication List  As of 01/01/2012 12:53 PM   STOP taking these medications         ciprofloxacin 750 MG tablet      DEPAKOTE PO      LIPITOR PO         TAKE these medications         albuterol 108 (90 BASE) MCG/ACT inhaler   Commonly known as: PROVENTIL HFA;VENTOLIN HFA   Inhale 2 puffs into the lungs every 6 (six) hours as needed. For cold symptoms      amLODipine 5 MG tablet   Commonly known as: NORVASC   Take 5 mg by mouth daily.      aspirin 81 MG EC tablet   Take 1 tablet (81 mg total) by mouth daily.      atorvastatin 20 MG tablet   Commonly known as: LIPITOR   Take 20 mg by mouth daily.      cholecalciferol 1000 UNITS tablet   Commonly known as: VITAMIN D   Take 1,000 Units by mouth 2 (two) times daily.      divalproex 500 MG 24 hr tablet   Commonly known as: DEPAKOTE ER   Take 1,000 mg by mouth at bedtime.      DULoxetine 30 MG capsule   Commonly known as: CYMBALTA   Take 30 mg by mouth at bedtime.      gabapentin 300 MG capsule   Commonly known as: NEURONTIN   Take 600 mg by mouth at bedtime.      insulin glargine 100 UNIT/ML injection   Commonly known as: LANTUS   Inject 60 Units into the skin at bedtime.      Iron 325 (65 FE) MG Tabs   Take 1 tablet by mouth daily.      pioglitazone 45 MG tablet   Commonly known as: ACTOS   Take 45 mg by mouth daily.      sulfamethoxazole-trimethoprim 800-160 MG per tablet   Commonly known as: BACTRIM DS,SEPTRA DS   Take 1 tablet by mouth 2 (two) times daily. For 10 days with regimen started on 12/23/10             BRIEF HPI:  See H&P, Labs, Consult and Test reports for all details in brief, patienta 55 y.o. male, with history of diabetes mellitus type 2, diabetic neuropathy, recent left great toe infection for which he recently finished oral Bactrim and Cipro treatment, comes in with 3 to four-day history of fever chills, increased swelling and discharge from his left great toe, he also claims that he missed a few doses of his Lantus and diabetic medications, presents to the ER where his initial workup and x-ray was suggestive of left great toe was to mellitus, he was found to be slightly hypotensive, however his blood pressures improved after he received some IV fluid bolus.  CONSULTATIONS:  ID and general surgery  PERTINENT RADIOLOGIC STUDIES: Dg Foot Complete Left  12/26/2011  *RADIOLOGY REPORT*  Clinical Data: Great toe infection.  Ulceration.  LEFT FOOT - COMPLETE 3+ VIEW  Comparison: 11/18/2011.  Findings: Diabetic type small vessel atherosclerotic calcifications.  Soft tissue swelling is present over the great toe.  There is osteolysis of the volar aspect of the proximal phalanx compatible with active osteomyelitis.  Rare faction of bone is seen on both the frontal and oblique views.  IMPRESSION: Proximal aspect terminal phalanx great toe active osteomyelitis.  Original Report Authenticated By: Andreas Newport, M.D.     PERTINENT LAB RESULTS: CBC:  Basename 12/31/11 0715  WBC 6.3  HGB 9.3*  HCT 28.2*  PLT 228   CMET CMP     Component Value Date/Time   NA 137 12/31/2011 0715   K 5.5* 12/31/2011 0715   CL 102 12/31/2011 0715   CO2 28 12/31/2011 0715   GLUCOSE 121* 12/31/2011 0715   BUN 17 12/31/2011 0715    CREATININE 0.84 12/31/2011 0715   CALCIUM 9.0 12/31/2011 0715   PROT 7.0 12/26/2011 1431   ALBUMIN 3.2* 12/26/2011 1431   AST 14 12/26/2011 1431   ALT 13 12/26/2011 1431   ALKPHOS 61 12/26/2011 1431   BILITOT 0.7 12/26/2011 1431   GFRNONAA >90 12/31/2011 0715   GFRAA >90 12/31/2011 0715    GFR Estimated Creatinine Clearance: 130.7 ml/min (by C-G formula based on Cr of 0.84). No results found for this basename: LIPASE:2,AMYLASE:2 in the last 72 hours No results found for this basename: CKTOTAL:3,CKMB:3,CKMBINDEX:3,TROPONINI:3 in the last 72 hours No components found with this basename: POCBNP:3 No results found for this basename: DDIMER:2 in the last 72 hours No results found for this basename: HGBA1C:2 in the last 72 hours No results found for this basename: CHOL:2,HDL:2,LDLCALC:2,TRIG:2,CHOLHDL:2,LDLDIRECT:2 in the last 72 hours No results found for this basename: TSH,T4TOTAL,FREET3,T3FREE,THYROIDAB in the last 72 hours No results found for this basename: VITAMINB12:2,FOLATE:2,FERRITIN:2,TIBC:2,IRON:2,RETICCTPCT:2 in the last 72 hours Coags: No results found for this basename: PT:2,INR:2 in the last 72 hours Microbiology: Recent Results (from the past 240 hour(s))  CULTURE, BLOOD (ROUTINE X 2)     Status: Normal (Preliminary result)   Collection Time   12/26/11  3:50 PM      Component Value Range Status Comment   Specimen Description BLOOD ARM RIGHT   Final    Special Requests BOTTLES DRAWN AEROBIC AND ANAEROBIC B 10CC R 7CC   Final    Culture  Setup Time [id number]   Final    Culture     Final    Value:        BLOOD CULTURE RECEIVED NO GROWTH TO DATE CULTURE WILL BE HELD FOR 5 DAYS BEFORE ISSUING A FINAL NEGATIVE REPORT   Report Status PENDING   Incomplete   CULTURE, BLOOD (ROUTINE X 2)     Status: Normal   Collection Time   12/26/11  4:27 PM      Component Value Range Status Comment   Specimen Description BLOOD HAND RIGHT   Final    Special Requests BOTTLES DRAWN AEROBIC ONLY 10CC    Final    Culture  Setup Time [id number]   Final    Culture NO GROWTH 5 DAYS   Final    Report Status 01/01/2012 FINAL   Final   MRSA PCR SCREENING     Status: Normal   Collection Time   12/26/11  6:58 PM      Component Value Range  Status Comment   MRSA by PCR NEGATIVE  NEGATIVE  Final      BRIEF HOSPITAL COURSE:    Principal Problem:  *Diabetic foot infection with osteomyelitis -Diabetic foot infection  -Status post left great toe amputation, has received 3 days antibiotics post-op -I have spoken with Dr's Ophelia Charter and Hatcher-who have agreed that patient does not need further antibiotics either IV or orally. -Patient to follow up with Dr Ophelia Charter in 1 week.  Diabetes Mellitus  -continue with lantus on discharge  DIABETIC PERIPHERAL NEUROPATHY  -continue with Neurontin  ARF  -resolved with IVF  -likely pre-renal-given diabetic foot  HTN  -Antihypertensives held on admission for relative hypotension -however resume amlodipine on discharge -has had mild hyperkalemia-so will not change to ACEI. I have asked the patient to follow up with his primary care practitioner regarding this, and he has agreed.  HYPERLIPIDEMIA  -Continue with Statin   TODAY-DAY OF DISCHARGE:  Subjective:   Caleb Joseph today has no headache,no chest abdominal pain,no new weakness tingling or numbness, feels much better wants to go home today.   Objective:   Blood pressure 107/61, pulse 75, temperature 98.3 F (36.8 C), temperature source Oral, resp. rate 18, height 6\' 3"  (1.905 m), weight 108.364 kg (238 lb 14.4 oz), SpO2 97.00%.  Intake/Output Summary (Last 24 hours) at 01/01/12 1253 Last data filed at 01/01/12 0145  Gross per 24 hour  Intake    960 ml  Output      0 ml  Net    960 ml    Exam Awake Alert, Oriented *3, No new F.N deficits, Normal affect [place].AT,PERRAL Supple Neck,No JVD, No cervical lymphadenopathy appriciated.  Symmetrical Chest wall movement, Good air movement bilaterally,  CTAB RRR,No Gallops,Rubs or new Murmurs, No Parasternal Heave +ve B.Sounds, Abd Soft, Non tender, No organomegaly appriciated, No rebound -guarding or rigidity. No Cyanosis, Clubbing or edema, No new Rash or bruise  DISPOSITION: Home    DISCHARGE INSTRUCTIONS:    Follow-up Information    Follow up with [facility], [place]. Schedule an appointment as soon as possible for a visit in 1 week.      Follow up with Eldred Manges, MD. Schedule an appointment as soon as possible for a visit in 1 week.   Contact information:   [facility] [address] [place] [address] [phone]         Total Time spent on discharge equals 45 minutes.  SignedJeoffrey Massed 01/01/2012 12:53 PM

## 2012-01-01 NOTE — Progress Notes (Signed)
INFECTIOUS DISEASE PROGRESS NOTE  ID: Caleb Joseph is a 56 y.o. male with   Principal Problem:  *Diabetic foot infection Active Problems:  METHICILLIN RESISTANT STAPH AUREUS SEPTICEMIA  DIABETIC PERIPHERAL NEUROPATHY  HYPERLIPIDEMIA  HYPERKALEMIA  ANEMIA  Subjective: Without complaint, wants to know when he go home.   Abtx:  Anti-infectives     Start     Dose/Rate Route Frequency Ordered Stop   12/27/11 1630   vancomycin (VANCOCIN) 1,500 mg in sodium chloride 0.9 % 500 mL IVPB  Status:  Discontinued        1,500 mg 250 mL/hr over 120 Minutes Intravenous Every 24 hours 12/26/11 1616 12/27/11 1301   12/27/11 1600   vancomycin (VANCOCIN) IVPB 1000 mg/200 mL premix        1,000 mg 200 mL/hr over 60 Minutes Intravenous Every 12 hours 12/27/11 1302     12/27/11 1400   meropenem (MERREM) 1 g in sodium chloride 0.9 % 100 mL IVPB        1 g 200 mL/hr over 30 Minutes Intravenous 3 times per day 12/27/11 1303     12/26/11 1700   meropenem (MERREM) 1 g in sodium chloride 0.9 % 100 mL IVPB  Status:  Discontinued        1 g 200 mL/hr over 30 Minutes Intravenous Every 12 hours 12/26/11 1617 12/27/11 1303   12/26/11 1630   vancomycin (VANCOCIN) 500 mg in sodium chloride 0.9 % 100 mL IVPB        500 mg 100 mL/hr over 60 Minutes Intravenous  Once 12/26/11 1616 12/26/11 1820   12/26/11 1400   vancomycin (VANCOCIN) IVPB 1000 mg/200 mL premix        1,000 mg 200 mL/hr over 60 Minutes Intravenous  Once 12/26/11 1358 12/26/11 1659   12/26/11 1400  piperacillin-tazobactam (ZOSYN) IVPB 3.375 g       3.375 g 12.5 mL/hr over 240 Minutes Intravenous  Once 12/26/11 1358 12/26/11 1916          Medications:  Scheduled:   . aspirin EC  81 mg Oral Daily  . cholecalciferol  1,000 Units Oral BID  . divalproex  1,000 mg Oral QHS  . DULoxetine  30 mg Oral Daily  . gabapentin  600 mg Oral Daily  . heparin  5,000 Units Subcutaneous Q8H  . insulin aspart  0-15 Units Subcutaneous TID WC  .  insulin aspart  0-5 Units Subcutaneous QHS  . insulin aspart  4 Units Subcutaneous TID WC  . insulin glargine  65 Units Subcutaneous Daily  . meropenem (MERREM) IV  1 g Intravenous Q8H  . simvastatin  20 mg Oral Daily  . sodium chloride  3 mL Intravenous Q12H  . sodium polystyrene  30 g Oral Once  . vancomycin  1,000 mg Intravenous Q12H    Objective: Vital signs in last 24 hours: Temp:  [97.8 F (36.6 C)-99.9 F (37.7 C)] 98.3 F (36.8 C) (02/05 1000) Pulse Rate:  [75-86] 75  (02/05 1000) Resp:  [18-20] 18  (02/05 1000) BP: (107-138)/(61-76) 107/61 mmHg (02/05 1000) SpO2:  [96 %-100 %] 97 % (02/05 1000)   General appearance: alert and no distress Extremities: wound is undressed, clean, no d/c, healing well.   Lab Results  [facility] 12/31/11 0715  WBC 6.3  HGB 9.3*  HCT 28.2*  NA 137  K 5.5*  CL 102  CO2 28  BUN 17  CREATININE 0.84  GLU --   Liver Panel No results found  for this basename: PROT:2,ALBUMIN:2,AST:2,ALT:2,ALKPHOS:2,BILITOT:2,BILIDIR:2,IBILI:2 in the last 72 hours Sedimentation Rate No results found for this basename: ESRSEDRATE in the last 72 hours C-Reactive Protein No results found for this basename: CRP:2 in the last 72 hours  Microbiology: Recent Results (from the past 240 hour(s))  CULTURE, BLOOD (ROUTINE X 2)     Status: Normal (Preliminary result)   Collection Time   12/26/11  3:50 PM      Component Value Range Status Comment   Specimen Description BLOOD ARM RIGHT   Final    Special Requests BOTTLES DRAWN AEROBIC AND ANAEROBIC B 10CC R 7CC   Final    Culture  Setup Time [id number]   Final    Culture     Final    Value:        BLOOD CULTURE RECEIVED NO GROWTH TO DATE CULTURE WILL BE HELD FOR 5 DAYS BEFORE ISSUING A FINAL NEGATIVE REPORT   Report Status PENDING   Incomplete   CULTURE, BLOOD (ROUTINE X 2)     Status: Normal   Collection Time   12/26/11  4:27 PM      Component Value Range Status Comment   Specimen Description BLOOD HAND RIGHT    Final    Special Requests BOTTLES DRAWN AEROBIC ONLY 10CC   Final    Culture  Setup Time [id number]   Final    Culture NO GROWTH 5 DAYS   Final    Report Status 01/01/2012 FINAL   Final   MRSA PCR SCREENING     Status: Normal   Collection Time   12/26/11  6:58 PM      Component Value Range Status Comment   MRSA by PCR NEGATIVE  NEGATIVE  Final     Studies/Results: No results found.   Assessment/Plan: Osteomyelitis, Diabetic Foot Infection  Day 5 anbx  BCx (12-26-11) no growth to date.  S/p amputation 12-30-11.  If his margins were felt to be clean, would consider short course of anbx (5 days after amputation). If any question about viability of remaining bone, would consider at least 2 weeks. Could he be a candidate for ACE-I? Will stop his isolation, discussed with IP  Johny Sax Infectious Diseases [phone] 01/01/2012, 11:59 AM

## 2012-01-02 LAB — CULTURE, BLOOD (ROUTINE X 2)

## 2012-03-04 ENCOUNTER — Other Ambulatory Visit (HOSPITAL_COMMUNITY): Payer: Self-pay | Admitting: Orthopedic Surgery

## 2012-03-05 ENCOUNTER — Encounter (HOSPITAL_COMMUNITY): Payer: Self-pay | Admitting: Respiratory Therapy

## 2012-03-07 ENCOUNTER — Encounter (HOSPITAL_COMMUNITY): Admission: RE | Payer: Self-pay | Source: Ambulatory Visit

## 2012-03-07 ENCOUNTER — Ambulatory Visit (HOSPITAL_COMMUNITY): Admission: RE | Admit: 2012-03-07 | Payer: Self-pay | Source: Ambulatory Visit | Admitting: Orthopedic Surgery

## 2012-03-07 SURGERY — AMPUTATION DIGIT
Anesthesia: Choice | Site: Foot | Laterality: Left

## 2012-03-13 ENCOUNTER — Other Ambulatory Visit (HOSPITAL_COMMUNITY): Payer: Self-pay | Admitting: Internal Medicine

## 2012-03-13 ENCOUNTER — Ambulatory Visit (HOSPITAL_COMMUNITY)
Admission: RE | Admit: 2012-03-13 | Discharge: 2012-03-13 | Disposition: A | Discharge status: Home / Self Care | Payer: Self-pay | Source: Ambulatory Visit | Attending: Internal Medicine | Admitting: Internal Medicine

## 2012-03-13 DIAGNOSIS — M79609 Pain in unspecified limb: Secondary | ICD-10-CM | POA: Insufficient documentation

## 2012-03-13 DIAGNOSIS — R609 Edema, unspecified: Secondary | ICD-10-CM | POA: Insufficient documentation

## 2012-03-13 DIAGNOSIS — R52 Pain, unspecified: Secondary | ICD-10-CM

## 2012-03-13 DIAGNOSIS — S92919A Unspecified fracture of unspecified toe(s), initial encounter for closed fracture: Secondary | ICD-10-CM | POA: Insufficient documentation

## 2012-03-13 DIAGNOSIS — X58XXXA Exposure to other specified factors, initial encounter: Secondary | ICD-10-CM | POA: Insufficient documentation

## 2012-03-17 ENCOUNTER — Encounter (HOSPITAL_COMMUNITY): Payer: Self-pay | Admitting: Respiratory Therapy

## 2012-03-17 ENCOUNTER — Other Ambulatory Visit (HOSPITAL_COMMUNITY): Payer: Self-pay | Admitting: Orthopaedic Surgery

## 2012-03-19 ENCOUNTER — Encounter (HOSPITAL_COMMUNITY): Payer: Self-pay | Admitting: *Deleted

## 2012-03-19 MED ORDER — CEFAZOLIN SODIUM-DEXTROSE 2-3 GM-% IV SOLR
2.0000 g | INTRAVENOUS | Status: AC
  Administered 2012-03-20: 2 g via INTRAVENOUS
  Filled 2012-03-19: qty 50

## 2012-03-20 ENCOUNTER — Ambulatory Visit (HOSPITAL_COMMUNITY)
Admission: RE | Admit: 2012-03-20 | Discharge: 2012-03-20 | Disposition: A | Discharge status: Home / Self Care | Payer: Self-pay | Source: Ambulatory Visit | Attending: Orthopaedic Surgery | Admitting: Orthopaedic Surgery

## 2012-03-20 ENCOUNTER — Encounter (HOSPITAL_COMMUNITY): Payer: Self-pay | Admitting: Anesthesiology

## 2012-03-20 ENCOUNTER — Encounter (HOSPITAL_COMMUNITY)
Admission: RE | Disposition: A | Discharge status: Home / Self Care | Payer: Self-pay | Source: Ambulatory Visit | Attending: Orthopaedic Surgery

## 2012-03-20 ENCOUNTER — Ambulatory Visit (HOSPITAL_COMMUNITY): Payer: Self-pay | Admitting: Anesthesiology

## 2012-03-20 ENCOUNTER — Ambulatory Visit (HOSPITAL_COMMUNITY): Payer: Self-pay

## 2012-03-20 ENCOUNTER — Encounter (HOSPITAL_COMMUNITY): Payer: Self-pay | Admitting: *Deleted

## 2012-03-20 DIAGNOSIS — E1142 Type 2 diabetes mellitus with diabetic polyneuropathy: Secondary | ICD-10-CM | POA: Insufficient documentation

## 2012-03-20 DIAGNOSIS — E1149 Type 2 diabetes mellitus with other diabetic neurological complication: Secondary | ICD-10-CM | POA: Insufficient documentation

## 2012-03-20 DIAGNOSIS — L02612 Cutaneous abscess of left foot: Secondary | ICD-10-CM

## 2012-03-20 DIAGNOSIS — M9080 Osteopathy in diseases classified elsewhere, unspecified site: Secondary | ICD-10-CM | POA: Insufficient documentation

## 2012-03-20 DIAGNOSIS — R0602 Shortness of breath: Secondary | ICD-10-CM | POA: Insufficient documentation

## 2012-03-20 DIAGNOSIS — E1169 Type 2 diabetes mellitus with other specified complication: Secondary | ICD-10-CM | POA: Insufficient documentation

## 2012-03-20 DIAGNOSIS — M86179 Other acute osteomyelitis, unspecified ankle and foot: Secondary | ICD-10-CM | POA: Insufficient documentation

## 2012-03-20 DIAGNOSIS — I10 Essential (primary) hypertension: Secondary | ICD-10-CM | POA: Insufficient documentation

## 2012-03-20 DIAGNOSIS — E1159 Type 2 diabetes mellitus with other circulatory complications: Secondary | ICD-10-CM

## 2012-03-20 HISTORY — DX: Essential (primary) hypertension: I10

## 2012-03-20 HISTORY — PX: AMPUTATION: SHX166

## 2012-03-20 LAB — CBC
HCT: 30.9 % — ABNORMAL LOW (ref 39.0–52.0)
Hemoglobin: 10.2 g/dL — ABNORMAL LOW (ref 13.0–17.0)
RBC: 3.54 MIL/uL — ABNORMAL LOW (ref 4.22–5.81)

## 2012-03-20 LAB — BASIC METABOLIC PANEL
Chloride: 99 mEq/L (ref 96–112)
GFR calc Af Amer: 71 mL/min — ABNORMAL LOW (ref >90)
GFR calc non Af Amer: 62 mL/min — ABNORMAL LOW (ref >90)
Potassium: 5.2 mEq/L — ABNORMAL HIGH (ref 3.5–5.1)
Sodium: 134 mEq/L — ABNORMAL LOW (ref 135–145)

## 2012-03-20 LAB — SURGICAL PCR SCREEN
MRSA, PCR: NEGATIVE
Staphylococcus aureus: NEGATIVE

## 2012-03-20 LAB — GLUCOSE, CAPILLARY: Glucose-Capillary: 125 mg/dL — ABNORMAL HIGH (ref 70–99)

## 2012-03-20 SURGERY — AMPUTATION DIGIT
Anesthesia: General | Site: Toe | Laterality: Left | Wound class: Dirty or Infected

## 2012-03-20 MED ORDER — PROPOFOL 10 MG/ML IV EMUL
INTRAVENOUS | Status: DC | PRN
  Administered 2012-03-20: 150 mg via INTRAVENOUS

## 2012-03-20 MED ORDER — HYDROMORPHONE HCL PF 1 MG/ML IJ SOLN: 0.2500 mg | INTRAMUSCULAR | Status: DC | PRN

## 2012-03-20 MED ORDER — LIDOCAINE HCL (CARDIAC) 20 MG/ML IV SOLN
INTRAVENOUS | Status: DC | PRN
  Administered 2012-03-20: 100 mg via INTRAVENOUS

## 2012-03-20 MED ORDER — LACTATED RINGERS IV SOLN
INTRAVENOUS | Status: DC | PRN
  Administered 2012-03-20: 11:00:00 via INTRAVENOUS

## 2012-03-20 MED ORDER — LORAZEPAM 2 MG/ML IJ SOLN: 1.0000 mg | Freq: Once | INTRAMUSCULAR | Status: DC | PRN

## 2012-03-20 MED ORDER — MUPIROCIN 2 % EX OINT
TOPICAL_OINTMENT | Freq: Once | CUTANEOUS | Status: DC
  Filled 2012-03-20: qty 22

## 2012-03-20 MED ORDER — MIDAZOLAM HCL 5 MG/5ML IJ SOLN
INTRAMUSCULAR | Status: DC | PRN
  Administered 2012-03-20: 2 mg via INTRAVENOUS

## 2012-03-20 MED ORDER — HYDROCODONE-ACETAMINOPHEN 5-325 MG PO TABS: 1.0000 | ORAL_TABLET | Freq: Four times a day (QID) | ORAL | Status: AC | PRN

## 2012-03-20 MED ORDER — FENTANYL CITRATE 0.05 MG/ML IJ SOLN
INTRAMUSCULAR | Status: DC | PRN
  Administered 2012-03-20 (×2): 50 ug via INTRAVENOUS

## 2012-03-20 MED ORDER — SODIUM CHLORIDE 0.9 % IR SOLN
Status: DC | PRN
  Administered 2012-03-20: 1000 mL

## 2012-03-20 MED ORDER — ONDANSETRON HCL 4 MG/2ML IJ SOLN
INTRAMUSCULAR | Status: DC | PRN
  Administered 2012-03-20: 4 mg via INTRAVENOUS

## 2012-03-20 MED ORDER — MUPIROCIN 2 % EX OINT
TOPICAL_OINTMENT | CUTANEOUS | Status: AC
  Administered 2012-03-20: 1 via NASAL
  Filled 2012-03-20: qty 22

## 2012-03-20 SURGICAL SUPPLY — 53 items
BANDAGE GAUZE 4  KLING STR (GAUZE/BANDAGES/DRESSINGS) IMPLANT
BANDAGE GAUZE ELAST BULKY 4 IN (GAUZE/BANDAGES/DRESSINGS) ×2 IMPLANT
BLADE AVERAGE 25X9 (BLADE) IMPLANT
BLADE MINI RND TIP GREEN BEAV (BLADE) IMPLANT
BNDG CMPR 9X4 STRL LF SNTH (GAUZE/BANDAGES/DRESSINGS) ×1
BNDG COHESIVE 1X5 TAN STRL LF (GAUZE/BANDAGES/DRESSINGS) IMPLANT
BNDG COHESIVE 4X5 TAN STRL (GAUZE/BANDAGES/DRESSINGS) ×2 IMPLANT
BNDG COHESIVE 6X5 TAN STRL LF (GAUZE/BANDAGES/DRESSINGS) IMPLANT
BNDG ESMARK 4X9 LF (GAUZE/BANDAGES/DRESSINGS) ×2 IMPLANT
BNDG GAUZE STRTCH 6 (GAUZE/BANDAGES/DRESSINGS) IMPLANT
CLOTH BEACON ORANGE TIMEOUT ST (SAFETY) ×2 IMPLANT
CORDS BIPOLAR (ELECTRODE) IMPLANT
COVER SURGICAL LIGHT HANDLE (MISCELLANEOUS) ×2 IMPLANT
CUFF TOURNIQUET SINGLE 18IN (TOURNIQUET CUFF) IMPLANT
CUFF TOURNIQUET SINGLE 24IN (TOURNIQUET CUFF) IMPLANT
CUFF TOURNIQUET SINGLE 34IN LL (TOURNIQUET CUFF) IMPLANT
DRAPE U-SHAPE 47X51 STRL (DRAPES) ×2 IMPLANT
DURAPREP 26ML APPLICATOR (WOUND CARE) ×2 IMPLANT
ELECT REM PT RETURN 9FT ADLT (ELECTROSURGICAL) ×2
ELECTRODE REM PT RTRN 9FT ADLT (ELECTROSURGICAL) ×1 IMPLANT
GAUZE SPONGE 2X2 8PLY STRL LF (GAUZE/BANDAGES/DRESSINGS) IMPLANT
GAUZE XEROFORM 1X8 LF (GAUZE/BANDAGES/DRESSINGS) ×2 IMPLANT
GLOVE BIOGEL PI IND STRL 6.5 (GLOVE) ×1 IMPLANT
GLOVE BIOGEL PI IND STRL 7.5 (GLOVE) ×1 IMPLANT
GLOVE BIOGEL PI IND STRL 8 (GLOVE) ×1 IMPLANT
GLOVE BIOGEL PI INDICATOR 6.5 (GLOVE) ×1
GLOVE BIOGEL PI INDICATOR 7.5 (GLOVE) ×1
GLOVE BIOGEL PI INDICATOR 8 (GLOVE) ×1
GLOVE ORTHO TXT STRL SZ7.5 (GLOVE) ×2 IMPLANT
GLOVE SURG SS PI 7.5 STRL IVOR (GLOVE) ×2 IMPLANT
GOWN PREVENTION PLUS LG XLONG (DISPOSABLE) IMPLANT
GOWN PREVENTION PLUS XLARGE (GOWN DISPOSABLE) ×4 IMPLANT
GOWN STRL NON-REIN LRG LVL3 (GOWN DISPOSABLE) IMPLANT
KIT BASIN OR (CUSTOM PROCEDURE TRAY) ×2 IMPLANT
KIT ROOM TURNOVER OR (KITS) ×2 IMPLANT
MANIFOLD NEPTUNE II (INSTRUMENTS) IMPLANT
NEEDLE HYPO 25GX1X1/2 BEV (NEEDLE) IMPLANT
NS IRRIG 1000ML POUR BTL (IV SOLUTION) ×2 IMPLANT
PACK ORTHO EXTREMITY (CUSTOM PROCEDURE TRAY) ×2 IMPLANT
PAD ARMBOARD 7.5X6 YLW CONV (MISCELLANEOUS) ×4 IMPLANT
PAD CAST 4YDX4 CTTN HI CHSV (CAST SUPPLIES) IMPLANT
PADDING CAST COTTON 4X4 STRL (CAST SUPPLIES)
SPECIMEN JAR SMALL (MISCELLANEOUS) ×2 IMPLANT
SPONGE GAUZE 2X2 STER 10/PKG (GAUZE/BANDAGES/DRESSINGS)
SPONGE GAUZE 4X4 12PLY (GAUZE/BANDAGES/DRESSINGS) ×2 IMPLANT
SUCTION FRAZIER TIP 10 FR DISP (SUCTIONS) IMPLANT
SUT ETHILON 2 0 FS 18 (SUTURE) IMPLANT
SUT VIC AB 2-0 FS1 27 (SUTURE) IMPLANT
SYR CONTROL 10ML LL (SYRINGE) IMPLANT
TOWEL OR 17X24 6PK STRL BLUE (TOWEL DISPOSABLE) ×2 IMPLANT
TOWEL OR 17X26 10 PK STRL BLUE (TOWEL DISPOSABLE) ×2 IMPLANT
TUBE CONNECTING 12X1/4 (SUCTIONS) ×2 IMPLANT
WATER STERILE IRR 1000ML POUR (IV SOLUTION) ×2 IMPLANT

## 2012-03-20 NOTE — Preoperative (Signed)
Beta Blockers   Reason not to administer Beta Blockers:Not Applicable 

## 2012-03-20 NOTE — Anesthesia Postprocedure Evaluation (Signed)
  Anesthesia Post-op Note  Patient: Caleb Joseph  Procedure(s) Performed: Procedure(s) (LRB): AMPUTATION DIGIT (Left)  Patient Location: PACU  Anesthesia Type: General  Level of Consciousness: awake and alert   Airway and Oxygen Therapy: Patient Spontanous Breathing and Patient connected to nasal cannula oxygen  Post-op Pain: mild  Post-op Assessment: Post-op Vital signs reviewed, Patient's Cardiovascular Status Stable, Respiratory Function Stable and Patent Airway  Post-op Vital Signs: Reviewed and stable  Complications: No apparent anesthesia complications

## 2012-03-20 NOTE — Anesthesia Preprocedure Evaluation (Addendum)
Anesthesia Evaluation  Patient identified by MRN, date of birth, ID band Patient awake    Reviewed: Allergy & Precautions, H&P , NPO status , Patient's Chart, lab work & pertinent test results  Airway Mallampati: II TM Distance: >3 FB Neck ROM: Full    Dental  (+) Poor Dentition and Chipped   Pulmonary shortness of breath,    Pulmonary exam normal       Cardiovascular hypertension,     Neuro/Psych Depression  Neuromuscular disease    GI/Hepatic   Endo/Other  Diabetes mellitus-  Renal/GU      Musculoskeletal   Abdominal   Peds  Hematology   Anesthesia Other Findings   Reproductive/Obstetrics                          Anesthesia Physical Anesthesia Plan  ASA: III  Anesthesia Plan: General   Post-op Pain Management:    Induction: Intravenous  Airway Management Planned: LMA  Additional Equipment:   Intra-op Plan:   Post-operative Plan: Extubation in OR  Informed Consent: I have reviewed the patients History and Physical, chart, labs and discussed the procedure including the risks, benefits and alternatives for the proposed anesthesia with the patient or authorized representative who has indicated his/her understanding and acceptance.   Dental advisory given  Plan Discussed with: CRNA and Surgeon  Anesthesia Plan Comments:         Anesthesia Quick Evaluation

## 2012-03-20 NOTE — Op Note (Signed)
NAMEMarland Kitchen  SIAN, JOLES NO.:  [id number]  MEDICAL RECORD NO.:  [id number]  LOCATION:  XRAY                         FACILITY:  [facility]  PHYSICIAN:  Vanita Panda. Magnus Ivan, M.D.DATE OF BIRTH:  [birth year]  DATE OF PROCEDURE:  03/20/2012 DATE OF DISCHARGE:  03/20/2012                              OPERATIVE REPORT   PREOPERATIVE DIAGNOSIS:  Left third toe infection and ischemia.  POSTOPERATIVE DIAGNOSIS:  Left third toe infection and ischemia.  PROCEDURE: 1. Left third toe amputation. 2. MCP joint TO THE MTP JOINT.  SURGEON:  Vanita Panda. Magnus Ivan, M.D.  ANESTHESIA:  General.  BLOOD LOSS:  Less than 50 mL.  COMPLICATIONS:  None.  INDICATIONS:  Mr. Ketcher is a poorly controlled diabetic.  He has had a previous left foot great toe amputation.  He presented to the office with drainage and purulence coming from his third toe with ischemic changes at the end and the obvious need for an amputation, this does involve the bone as well with radiographic evidence of osteomyelitis. It is recommended he undergo a third toe amputation.  He wished to proceed with this as well given his history of poorly-controlled diabetes and the wounds that he has had.  PROCEDURE DESCRIPTION:  After informed consent was obtained, the appropriate left foot was marked.  He was brought to the operating room, placed up on the operating table.  General anesthesia was then obtained. His left foot was prepped and draped with DuraPrep and sterile drapes. Time-out was called, and he was identified as correct patient, correct left foot third toe.  I then used a towel and Esmarch around the ankle. I used a #10 blade and removed the toe down to the MTP joint and removed in its entirety.  Hemostasis was then obtained with electrocautery.  I thoroughly irrigated the wound with normal saline solution and then reapproximated the skin edges with interrupted 2-0 nylon suture.  Xeroform followed by  sterile dressing was applied.  The patient was awakened, extubated, and taken to recovery room in stable condition.  All final counts were correct. There were no complications noted.     Vanita Panda. Magnus Ivan, M.D.     CYB/MEDQ  D:  03/20/2012  T:  03/20/2012  Job:  [id number]

## 2012-03-20 NOTE — Brief Op Note (Signed)
03/20/2012  11:30 AM  PATIENT:  Fernanda Drum  56 y.o. male  PRE-OPERATIVE DIAGNOSIS:  Left foot 3rd toe infection  POST-OPERATIVE DIAGNOSIS:  Lef foot 3rd toe infection  PROCEDURE:  Procedure(s) (LRB): AMPUTATION DIGIT (Left)  SURGEON:  Surgeon(s) and Role:    * Kathryne Hitch, MD - Primary  PHYSICIAN ASSISTANT:   ASSISTANTS: none   ANESTHESIA:   general  EBL:  Total I/O In: 800 [I.V.:800] Out: 50 [Blood:50]  BLOOD ADMINISTERED:none  DRAINS: none   LOCAL MEDICATIONS USED:  NONE  SPECIMEN:  No Specimen  DISPOSITION OF SPECIMEN:  N/A  COUNTS:  YES  TOURNIQUET:  * No tourniquets in log *  DICTATION: .Other Dictation: Dictation Number [id number]  PLAN OF CARE: Discharge to home after PACU  PATIENT DISPOSITION:  PACU - hemodynamically stable.   Delay start of Pharmacological VTE agent (>24hrs) due to surgical blood loss or risk of bleeding: not applicable

## 2012-03-20 NOTE — Transfer of Care (Signed)
Immediate Anesthesia Transfer of Care Note  Patient: Caleb Joseph  Procedure(s) Performed: Procedure(s) (LRB): AMPUTATION DIGIT (Left)  Patient Location: PACU  Anesthesia Type: General  Level of Consciousness: awake and alert   Airway & Oxygen Therapy: Patient Spontanous Breathing and Patient connected to face mask oxygen  Post-op Assessment: Report given to PACU RN  Post vital signs: Reviewed and stable  Complications: No apparent anesthesia complications

## 2012-03-20 NOTE — H&P (Signed)
Caleb Joseph is an 56 y.o. male.   Chief Complaint:   Infected left foot 3rd toe HPI:   Poorly controlled diabetic male not real compliant with his medications who also does not treat himself well from a health standpoint.  He underwent a previous left great toe amputation.  His left foot 3rd toe now is swollen with a draining wound and in need of an amputation.  He understands the reasoning behind surgery as well as the risks and benefits involved and wishes to proceed.  Past Medical History  Diagnosis Date  . Diabetes mellitus   . 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 HYPERLIPIDEMIA 08/26/2007  . Diabetic foot infection 12/26/2011  . Hypertension     Past Surgical History  Procedure Date  . Toe amputation 08/2009    R little toe  . Amputation 12/30/2011    Procedure: AMPUTATION DIGIT;  Surgeon: Eldred Manges, MD;  Location: [facility];  Service: Orthopedics;  Laterality: Left;  Left Great  . Cardiac catheterization     History reviewed. No pertinent family history. Social History:  reports that he has been smoking Cigars and Pipe.  His smokeless tobacco use includes Chew and Snuff. He reports that he drinks alcohol. He reports that he does not use illicit drugs.  Allergies: No Known Allergies  Medications Prior to Admission  Medication Sig Dispense Refill  . amLODipine (NORVASC) 5 MG tablet Take 5 mg by mouth daily.      Marland Kitchen atorvastatin (LIPITOR) 20 MG tablet Take 20 mg by mouth daily.      . cholecalciferol (VITAMIN D) 1000 UNITS tablet Take 1,000 Units by mouth 2 (two) times daily.      . divalproex (DEPAKOTE ER) 500 MG 24 hr tablet Take 1,000 mg by mouth at bedtime.      . DULoxetine (CYMBALTA) 30 MG capsule Take 30 mg by mouth at bedtime.      . Ferrous Sulfate (IRON) 325 (65 FE) MG TABS Take 1 tablet by mouth daily.      Marland Kitchen gabapentin (NEURONTIN) 600 MG tablet Take 1,200 mg by mouth at bedtime.      . insulin aspart  (NOVOLOG) 100 UNIT/ML injection Inject into the skin 3 (three) times daily before meals.      . insulin glargine (LANTUS) 100 UNIT/ML injection Inject 60 Units into the skin at bedtime.        . pioglitazone (ACTOS) 15 MG tablet Take 15 mg by mouth daily.      Marland Kitchen albuterol (PROVENTIL HFA;VENTOLIN HFA) 108 (90 BASE) MCG/ACT inhaler Inhale 2 puffs into the lungs every 6 (six) hours as needed. For cold symptoms        Results for orders placed during the hospital encounter of 03/20/12 (from the past 48 hour(s))  BASIC METABOLIC PANEL     Status: Abnormal   Collection Time   03/20/12  8:52 AM      Component Value Range Comment   Sodium 134 (*) 135 - 145 (mEq/L)    Potassium 5.2 (*) 3.5 - 5.1 (mEq/L)    Chloride 99  96 - 112 (mEq/L)    CO2 24  19 - 32 (mEq/L)    Glucose, Bld 125 (*) 70 - 99 (mg/dL)    BUN 21  6 - 23 (mg/dL)    Creatinine, Ser 4.54  0.50 - 1.35 (mg/dL)    Calcium 9.2  8.4 - 10.5 (mg/dL)    GFR  calc non Af Amer 62 (*) >90 (mL/min)    GFR calc Af Amer 71 (*) >90 (mL/min)   CBC     Status: Abnormal   Collection Time   03/20/12  8:52 AM      Component Value Range Comment   WBC 6.1  4.0 - 10.5 (K/uL)    RBC 3.54 (*) 4.22 - 5.81 (MIL/uL)    Hemoglobin 10.2 (*) 13.0 - 17.0 (g/dL)    HCT 95.6 (*) 21.3 - 52.0 (%)    MCV 87.3  78.0 - 100.0 (fL)    MCH 28.8  26.0 - 34.0 (pg)    MCHC 33.0  30.0 - 36.0 (g/dL)    RDW 08.6  57.8 - 46.9 (%)    Platelets 189  150 - 400 (K/uL)   GLUCOSE, CAPILLARY     Status: Abnormal   Collection Time   03/20/12  8:56 AM      Component Value Range Comment   Glucose-Capillary 125 (*) 70 - 99 (mg/dL)    Dg Chest 2 View  05/25/5283  *RADIOLOGY REPORT*  Clinical Data: Preoperative evaluation for toe amputation. Hypertension  CHEST - 2 VIEW  Comparison: 06/14/2011.  Findings: Heart and mediastinal structures are within normal limits. The lung fields are clear with no signs of focal infiltrate or congestive failure. No pleural fluid or significant  peribronchial cuffing is seen.  Bony structures are intact.  IMPRESSION: No worrisome focal or acute cardiopulmonary abnormality seen.  Original Report Authenticated By: Bertha Stakes, M.D.    Review of Systems  All other systems reviewed and are negative.    Blood pressure 112/64, pulse 64, temperature 98.6 F (37 C), temperature source Oral, resp. rate 20, height 6\' 3"  (1.905 m), weight 113.399 kg (250 lb), SpO2 100.00%. Physical Exam  Constitutional: He is oriented to person, place, and time. He appears well-developed and well-nourished.  HENT:  Head: Normocephalic and atraumatic.  Eyes: Pupils are equal, round, and reactive to light.  Neck: Normal range of motion. Neck supple.  Cardiovascular: Normal rate and regular rhythm.   Respiratory: Effort normal and breath sounds normal.  GI: Soft. Bowel sounds are normal.  Musculoskeletal:       Feet:  Neurological: He is alert and oriented to person, place, and time.  Skin: Skin is warm and dry.  Psychiatric: He has a normal mood and affect.     Assessment/Plan To the OR for an amputation of his 3rd toe on his left foot.  Kathryne Hitch 03/20/2012, 9:54 AM

## 2012-03-21 ENCOUNTER — Encounter (HOSPITAL_COMMUNITY): Payer: Self-pay | Admitting: Orthopaedic Surgery

## 2012-10-27 ENCOUNTER — Ambulatory Visit: Payer: Self-pay | Admitting: Sports Medicine

## 2012-11-21 ENCOUNTER — Encounter: Payer: Self-pay | Admitting: Sports Medicine

## 2012-11-21 ENCOUNTER — Ambulatory Visit (INDEPENDENT_AMBULATORY_CARE_PROVIDER_SITE_OTHER): Payer: Self-pay | Admitting: Sports Medicine

## 2012-11-21 VITALS — BP 135/75 | HR 84 | Ht 72.5 in | Wt 245.0 lb

## 2012-11-21 DIAGNOSIS — E785 Hyperlipidemia, unspecified: Secondary | ICD-10-CM

## 2012-11-21 DIAGNOSIS — E1159 Type 2 diabetes mellitus with other circulatory complications: Secondary | ICD-10-CM

## 2012-11-21 DIAGNOSIS — N412 Abscess of prostate: Secondary | ICD-10-CM

## 2012-11-21 DIAGNOSIS — E1149 Type 2 diabetes mellitus with other diabetic neurological complication: Secondary | ICD-10-CM

## 2012-11-21 DIAGNOSIS — L97909 Non-pressure chronic ulcer of unspecified part of unspecified lower leg with unspecified severity: Secondary | ICD-10-CM

## 2012-11-21 DIAGNOSIS — E11311 Type 2 diabetes mellitus with unspecified diabetic retinopathy with macular edema: Secondary | ICD-10-CM

## 2012-11-21 DIAGNOSIS — I10 Essential (primary) hypertension: Secondary | ICD-10-CM

## 2012-11-21 LAB — BASIC METABOLIC PANEL
CO2: 29 mEq/L (ref 19–32)
Chloride: 98 mEq/L (ref 96–112)
Creat: 1.15 mg/dL (ref 0.50–1.35)
Glucose, Bld: 211 mg/dL — ABNORMAL HIGH (ref 70–99)

## 2012-11-21 LAB — POCT GLYCOSYLATED HEMOGLOBIN (HGB A1C): Hemoglobin A1C: 10.7

## 2012-11-21 LAB — LIPID PANEL
HDL: 28 mg/dL — ABNORMAL LOW (ref >39)
LDL Cholesterol: 77 mg/dL (ref 0–99)

## 2012-11-21 MED ORDER — METFORMIN HCL 500 MG PO TABS: 500.0000 mg | ORAL_TABLET | Freq: Two times a day (BID) | ORAL | Status: DC

## 2012-11-21 MED ORDER — ATORVASTATIN CALCIUM 20 MG PO TABS: 20.0000 mg | ORAL_TABLET | Freq: Every day | ORAL | Status: DC

## 2012-11-21 MED ORDER — INSULIN GLARGINE 100 UNIT/ML ~~LOC~~ SOLN: 60.0000 [IU] | Freq: Every day | SUBCUTANEOUS | Status: DC

## 2012-11-21 MED ORDER — OMEGA-3-ACID ETHYL ESTERS 1 G PO CAPS: 2.0000 g | ORAL_CAPSULE | Freq: Two times a day (BID) | ORAL | Status: DC

## 2012-11-21 NOTE — Patient Instructions (Addendum)
It was nice to meet you We are checking labs today Please return in 2-3 weeks

## 2012-11-27 ENCOUNTER — Encounter: Payer: Self-pay | Admitting: Sports Medicine

## 2012-11-27 NOTE — Assessment & Plan Note (Signed)
Continue Neurontin.  Consider increasing frequency.  Max night time dose

## 2012-11-27 NOTE — Assessment & Plan Note (Signed)
Continue Lantus & metformin Check A1c,  Will discuss changes at next office meeting

## 2012-11-27 NOTE — Progress Notes (Signed)
[facility]  Patient name: Caleb Joseph MRN [id number]  Date of birth: November 17, 1956  CC & HPI:  Caleb Joseph is a 57 y.o. male presenting today to establish care. He was previously followed at [facility]  his past medical history is significant for:  #  Hypertension - chronic problem, marginally controlled.   Pt reports no chest pain, no dyspnea on exertion, no orthopnea, no peripheral edema,   #  Diabetes - chronic problem, poorly controlled.  Home glucose monitoring is performed sporadically, does not check regularly.  Patient reports no polyuria or polydipsia, no chest pain, dyspnea or TIA's, no numbness, tingling or pain in extremities   #  Hyperlipidemia - chronic problem, unknown controlled  # Peripheral neuropathy - taking neurontin at night.  Does not limit him often.  History of DM ulcers.  Checks feet regularly, denies current lesions   ------------------------------------------------------------------------------------------------------------------ Medication Compliance: noncompliant much of the time  Diet Compliance: probably noncompliant though I cannot elicit that specific history  No acute concerns  ROS:  PER HPI  Pertinent History Reviewed:  Medical & Surgical Hx:  Reviewed & Updated - see associated sections in EMR. Medications: Reviewed & Updated - see associated section in EMR. Prior to Admission medications   Medication Sig Start Date End Date Taking? Authorizing Provider  albuterol (PROVENTIL HFA;VENTOLIN HFA) 108 (90 BASE) MCG/ACT inhaler Inhale 2 puffs into the lungs every 6 (six) hours as needed. For cold symptoms   Yes Historical Provider, MD  amLODipine (NORVASC) 5 MG tablet Take 5 mg by mouth daily.   Yes Historical Provider, MD  cholecalciferol (VITAMIN D) 1000 UNITS tablet Take 1,000 Units by mouth 2 (two) times daily.   Yes Historical Provider, MD  divalproex (DEPAKOTE ER) 500 MG 24 hr tablet Take 1,000 mg by mouth at bedtime.   Yes  Historical Provider, MD  DULoxetine (CYMBALTA) 30 MG capsule Take 30 mg by mouth at bedtime.   Yes Historical Provider, MD  Ferrous Sulfate (IRON) 325 (65 FE) MG TABS Take 1 tablet by mouth daily.   Yes Historical Provider, MD  gabapentin (NEURONTIN) 600 MG tablet Take 1,200 mg by mouth at bedtime.   Yes Historical Provider, MD  insulin glargine (LANTUS) 100 UNIT/ML injection Inject 60 Units into the skin daily with breakfast. 11/21/12  Yes Andrena Mews, DO  atorvastatin (LIPITOR) 20 MG tablet Take 1 tablet (20 mg total) by mouth daily. 11/21/12   Andrena Mews, DO  insulin aspart (NOVOLOG) 100 UNIT/ML injection Inject into the skin 3 (three) times daily before meals.    Historical Provider, MD  metFORMIN (GLUCOPHAGE) 500 MG tablet Take 1 tablet (500 mg total) by mouth 2 (two) times daily with a meal. 11/21/12   Andrena Mews, DO  omega-3 acid ethyl esters (LOVAZA) 1 G capsule Take 2 capsules (2 g total) by mouth 2 (two) times daily. 11/21/12   Andrena Mews, DO   Social History: Reviewed & Updated - see associated section in EMR.  Significant for  reports that he has been smoking Cigars and Pipe.  His smokeless tobacco use includes Chew and Snuff.  Objective Findings:  Vitals:  Filed Vitals:   11/21/12 1451  BP: 135/75  Pulse: 84    PE: GENERAL:  Adult Caucasian male. In no discomfort; no respiratory distress. PSYCH: Alert and appropriately interactive; Insight:Fair   H&N: AT/[place], trachea midline EENT:  MMM, no scleral icterus, EOMi HEART: RRR, S1/S2 heard, no murmur LUNGS: CTA B,  no wheezes, no crackles EXTREMITIES: Moves all 4 extremities spontaneously, warm well perfused, no edema, bilateral DP and PT pulses trace    Assessment & Plan:

## 2012-11-27 NOTE — Assessment & Plan Note (Signed)
Continue Lipitor Check Lipids today

## 2012-11-27 NOTE — Assessment & Plan Note (Signed)
On Norvasc Stable/Well Controlled - no changes at this time.

## 2012-11-27 NOTE — Assessment & Plan Note (Signed)
Reports no foot lesions, >Full DM foot exam at next visit

## 2012-12-16 ENCOUNTER — Ambulatory Visit (INDEPENDENT_AMBULATORY_CARE_PROVIDER_SITE_OTHER): Payer: No Typology Code available for payment source | Admitting: Sports Medicine

## 2012-12-16 ENCOUNTER — Encounter: Payer: Self-pay | Admitting: Sports Medicine

## 2012-12-16 VITALS — BP 141/84 | HR 80 | Temp 98.9°F | Ht 75.0 in | Wt 247.0 lb

## 2012-12-16 DIAGNOSIS — E1149 Type 2 diabetes mellitus with other diabetic neurological complication: Secondary | ICD-10-CM

## 2012-12-16 DIAGNOSIS — Z9119 Patient's noncompliance with other medical treatment and regimen: Secondary | ICD-10-CM | POA: Insufficient documentation

## 2012-12-16 DIAGNOSIS — R0602 Shortness of breath: Secondary | ICD-10-CM

## 2012-12-16 DIAGNOSIS — E1159 Type 2 diabetes mellitus with other circulatory complications: Secondary | ICD-10-CM

## 2012-12-16 DIAGNOSIS — E785 Hyperlipidemia, unspecified: Secondary | ICD-10-CM

## 2012-12-16 DIAGNOSIS — Z7189 Other specified counseling: Secondary | ICD-10-CM

## 2012-12-16 DIAGNOSIS — I10 Essential (primary) hypertension: Secondary | ICD-10-CM

## 2012-12-16 DIAGNOSIS — F329 Major depressive disorder, single episode, unspecified: Secondary | ICD-10-CM

## 2012-12-16 LAB — GLUCOSE, CAPILLARY: Glucose-Capillary: 315 mg/dL — ABNORMAL HIGH (ref 70–99)

## 2012-12-16 MED ORDER — INSULIN ASPART 100 UNIT/ML ~~LOC~~ SOLN: 2.0000 [IU] | Freq: Three times a day (TID) | SUBCUTANEOUS | Status: DC

## 2012-12-16 MED ORDER — OMEGA-3-ACID ETHYL ESTERS 1 G PO CAPS: 2.0000 g | ORAL_CAPSULE | Freq: Two times a day (BID) | ORAL | Status: DC

## 2012-12-16 MED ORDER — AMLODIPINE BESYLATE 5 MG PO TABS: 5.0000 mg | ORAL_TABLET | Freq: Every day | ORAL | Status: DC

## 2012-12-16 MED ORDER — METFORMIN HCL 500 MG PO TABS: 500.0000 mg | ORAL_TABLET | Freq: Two times a day (BID) | ORAL | Status: DC

## 2012-12-16 MED ORDER — GABAPENTIN 600 MG PO TABS: 1200.0000 mg | ORAL_TABLET | Freq: Every day | ORAL | Status: DC

## 2012-12-16 MED ORDER — DULOXETINE HCL 30 MG PO CPEP: 30.0000 mg | ORAL_CAPSULE | Freq: Every day | ORAL | Status: AC

## 2012-12-16 MED ORDER — ALBUTEROL SULFATE HFA 108 (90 BASE) MCG/ACT IN AERS: 2.0000 | INHALATION_SPRAY | Freq: Four times a day (QID) | RESPIRATORY_TRACT | Status: AC | PRN

## 2012-12-16 MED ORDER — INSULIN GLARGINE 100 UNIT/ML ~~LOC~~ SOLN: 60.0000 [IU] | Freq: Every day | SUBCUTANEOUS | Status: DC

## 2012-12-16 MED ORDER — ACCU-CHEK FASTCLIX LANCETS MISC: 1.0000 [IU] | Freq: Four times a day (QID) | Status: DC

## 2012-12-16 MED ORDER — GLUCOSE BLOOD VI STRP: ORAL_STRIP | Status: DC

## 2012-12-16 MED ORDER — ACCU-CHEK AVIVA PLUS W/DEVICE KIT: 1.0000 | PACK | Freq: Four times a day (QID) | Status: DC

## 2012-12-16 MED ORDER — ATORVASTATIN CALCIUM 20 MG PO TABS: 20.0000 mg | ORAL_TABLET | Freq: Every day | ORAL | Status: DC

## 2012-12-16 MED ORDER — DIVALPROEX SODIUM ER 500 MG PO TB24: 1000.0000 mg | ORAL_TABLET | Freq: Every day | ORAL | Status: DC

## 2012-12-16 NOTE — Assessment & Plan Note (Signed)
Refilled Novolog, Lantus and Metformin Given Novolog and Lantus samples until able to fill Instructed to check sugars qid - Rxs for Accucheck Meter

## 2012-12-16 NOTE — Assessment & Plan Note (Addendum)
All new rx provided for [facility] rx Pt apathetic to his medical care;  Declines aggressive therapy of his DM and HTN however when discussing renal function he was something to help improve his kidneys.  Spent 50% of visit counseling pt on long term prognosis and associated morbidity of his disease processes.  Pt reports no changes anticipated in his health behaviors (Denies mood disorder or depression at this time)

## 2012-12-16 NOTE — Patient Instructions (Addendum)
It was nice to see you today.   Today we discussed: 1. Type II or unspecified type diabetes mellitus with peripheral circulatory disorders, uncontrolled(250.72)  Check your sugars 4 times per day  Use your sliding scale and Lantus   - Glucose (CBG)  - insulin glargine (LANTUS) 100 UNIT/ML injection; Inject 60 Units into the skin daily with breakfast.  Dispense: 10 mL; Refill: 5  - metFORMIN (GLUCOPHAGE) 500 MG tablet; Take 1 tablet (500 mg total) by mouth 2 (two) times daily with a meal.  Dispense: 180 tablet; Refill: 3  - Blood Glucose Monitoring Suppl (ACCU-CHEK AVIVA PLUS) W/DEVICE KIT; 1 Package by Does not apply route 4 (four) times daily.  Dispense: 1 kit; Refill: 0  - glucose blood test strip; Use as instructed  Dispense: 100 each; Refill: 12  - ACCU-CHEK FASTCLIX LANCETS MISC; 1 Units by Does not apply route 4 (four) times daily. Check sugar 10 x daily  Dispense: 300 each; Refill: 3  2. HYPERLIPIDEMIA  Continue your meds, increase your exercise, increase your fresh vegetable intake   - atorvastatin (LIPITOR) 20 MG tablet; Take 1 tablet (20 mg total) by mouth daily.  Dispense: 30 tablet; Refill: 11  - omega-3 acid ethyl esters (LOVAZA) 1 G capsule; Take 2 capsules (2 g total) by mouth 2 (two) times daily.  Dispense: 180 capsule; Refill: 3  4. Counseling and coordination of care  Call if issues with getting your prescriptions    Please plan to return to see me in 1 month.  If you need anything prior to seeing me please call the clinic.  Please Bring all medications with you to each appointment.

## 2012-12-16 NOTE — Progress Notes (Signed)
  [facility]  Patient name: CASHAWN YANKO MRN [id number]  Date of birth: [birth year]  CC & HPI:  Caleb Joseph is a 57 y.o. male presenting today for follow up of:  #  Hypertension - chronic problem, marginally controlled.   Pt reports no chest pain, no dyspnea on exertion, no orthopnea, no peripheral edema, no numbness, weakness, dysarthria #  Diabetes - chronic problem, poorly controlled.  Home glucose monitoring is not performed.  Last eye exam was >1 year Patient reports no polyuria or polydipsia, no chest pain, dyspnea;  no numbness, tingling or pain in extremities   #  Hyperlipidemia - chronic problem, moderately controlled  ------------------------------------------------------------------------------------------------------------------ Medication Compliance: noncompliant much of the time  Diet Compliance: noncompliant much of the time  ------------------------------------------------------------------------------------------------------------------ New Concerns:  None, review labs New print out of Rx's for [facility] program   ROS:  PER HPI  Pertinent History Reviewed:  Medical & Surgical Hx:  Reviewed: Significant for diabetic peripheral neuropathy, s/p amputations of LE digits Medications: Reviewed & Updated - see associated section Social History: Reviewed -  reports that he has been smoking Cigars and Pipe.  His smokeless tobacco use includes Chew and Snuff.  Objective Findings:  Vitals:  Filed Vitals:   12/16/12 1443  BP: 141/84  Pulse: 80  Temp: 98.9 F (37.2 C)    PE: GENERAL:  Adult caucasian male. In no discomfort; no respiratory distress. PSYCH: Alert and appropriately interactive; Insight:Fair and Lacking   H&N: AT/[place], trachea midline EENT:  MMM, no scleral icterus, EOMi HEART: RRR, S1/S2 heard, no murmur LUNGS: CTA B, no wheezes, no crackles EXTREMITIES: Moves all 4 extremities spontaneously, warm well perfused, trace edema, bilateral DP and PT  pulses not appreciated; foot exam and Monofilament testing per DM foot exam flow sheet   Assessment & Plan:

## 2012-12-18 NOTE — Assessment & Plan Note (Signed)
Refilled Neurontin.

## 2012-12-18 NOTE — Assessment & Plan Note (Signed)
Moderately controlled, not interested in changing meds at this time

## 2013-01-15 ENCOUNTER — Encounter: Payer: Self-pay | Admitting: Sports Medicine

## 2013-01-15 ENCOUNTER — Ambulatory Visit (INDEPENDENT_AMBULATORY_CARE_PROVIDER_SITE_OTHER): Payer: No Typology Code available for payment source | Admitting: Sports Medicine

## 2013-01-15 VITALS — BP 126/68 | HR 84 | Temp 97.7°F | Wt 244.9 lb

## 2013-01-15 DIAGNOSIS — E1149 Type 2 diabetes mellitus with other diabetic neurological complication: Secondary | ICD-10-CM

## 2013-01-15 DIAGNOSIS — N508 Other specified disorders of male genital organs: Secondary | ICD-10-CM

## 2013-01-15 DIAGNOSIS — L97909 Non-pressure chronic ulcer of unspecified part of unspecified lower leg with unspecified severity: Secondary | ICD-10-CM

## 2013-01-15 DIAGNOSIS — I10 Essential (primary) hypertension: Secondary | ICD-10-CM

## 2013-01-15 DIAGNOSIS — E1159 Type 2 diabetes mellitus with other circulatory complications: Secondary | ICD-10-CM

## 2013-01-15 DIAGNOSIS — E11311 Type 2 diabetes mellitus with unspecified diabetic retinopathy with macular edema: Secondary | ICD-10-CM

## 2013-01-15 DIAGNOSIS — E785 Hyperlipidemia, unspecified: Secondary | ICD-10-CM

## 2013-01-15 LAB — POCT GLYCOSYLATED HEMOGLOBIN (HGB A1C): Hemoglobin A1C: 12.4

## 2013-01-15 MED ORDER — RAMIPRIL 2.5 MG PO CAPS: 2.5000 mg | ORAL_CAPSULE | Freq: Every day | ORAL | Status: DC

## 2013-01-15 MED ORDER — INSULIN ASPART 100 UNIT/ML ~~LOC~~ SOLN: 2.0000 [IU] | Freq: Three times a day (TID) | SUBCUTANEOUS | Status: DC

## 2013-01-15 NOTE — Progress Notes (Signed)
  [facility]  Patient name: Caleb Joseph MRN [id number]  Date of birth: 1956-06-04  CC & HPI:  Caleb Joseph is a 57 y.o. male presenting today for follow up of:  #  Hypertension - chronic problem, adequately controlled.    Home BP checks/ranges: 120-140s systolic  no orthostasis  no chest pain, no dyspnea on exertion, no orthopnea/PND, no peripheral edema,   no episodes of unilateral weakness, dysarthria or acute visual changes  #  Diabetes - chronic problem, poorly controlled.   Blood Sugar checks/ranges: 250-300s   no hypoglycemic symptoms/episodes.   no poluria, no polydipsia,  no new visual problems,  last eye exam: >1 year  LE dysesthesias: None  self foot checks performed: Rarely  #  Hyperlipidemia - chronic problem,  controlled.    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 Not sexually active but "tightening" feeling with ejaculation with masturbation. ------------------------------------------------------------------------------------------------------------------ Medication Compliance: noncompliant much of the time  Diet Compliance: noncompliant much of the time ------------------------------------------------------------------------------------------------------------------ New Concerns:  None   ROS:  PER HPI  Pertinent History Reviewed:  Medical & Surgical Hx:  Reviewed: Significant for medical non-compliance Medications: Reviewed & Updated - See associated section in EMR Social History: Reviewed -  reports that he has been smoking Cigars and Pipe.  His smokeless tobacco use includes Chew and Snuff.   Objective Findings:  Vitals: BP 126/68  Pulse 84  Temp(Src) 97.7 F (36.5 C) (Oral)  Wt 244 lb 14.4 oz (111.086 kg)  BMI 30.61 kg/m2  PE: GENERAL:  Adult caucasian  male. In no discomfort; no respiratory distress. PSYCH: Alert and appropriately interactive;  Insight:Good and Fair   H&N: AT/[place], trachea midline EENT:  MMM, no scleral icterus, EOMi HEART: RRR, S1/S2 heard, no murmur LUNGS: CTA B, no wheezes, no crackles EXTREMITIES: Moves all 4 extremities spontaneously, warm well perfused, no edema, bilateral DP and PT pulses 2/4.   DM Foot Exam:  S/p multiple digit amputation bilaterally.    Dystrophic nails throughout - trimmed today  Lesion on R 3rd dorsum of toe  Lack of sensation to mid shin diffusely with monofilament testing, lack of sensation to sharp as well    Assessment & Plan:

## 2013-01-15 NOTE — Patient Instructions (Addendum)
It was nice to see you today.   Today we discussed: 1. DIABETES MELLITUS, TYPE II, ON INSULIN, UNCONTROLLED Check your sugars 4X per day Your insulin dosing is as per your med list Soak your feet in room temperature water nightly.  Cover the wound with vaseline after soaking - POCT A1C  2. HYPERTENSION Stop amlodipine Start: - ramipril (ALTACE) 2.5 MG capsule; Take 1 capsule (2.5 mg total) by mouth daily.  Dispense: 90 capsule; Refill: 3   Please plan to return to see me in 1 month.  If you need anything prior to seeing me please call the clinic.  Please Bring all medications with you to each appointment.

## 2013-01-15 NOTE — Assessment & Plan Note (Signed)
Change to ramipril for DM

## 2013-01-16 ENCOUNTER — Telehealth: Payer: Self-pay | Admitting: *Deleted

## 2013-01-16 MED ORDER — ROSUVASTATIN CALCIUM 5 MG PO TABS: 5.0000 mg | ORAL_TABLET | Freq: Every day | ORAL | Status: DC

## 2013-01-16 NOTE — Telephone Encounter (Signed)
Okay.  Rx printed and placed in to be faxed

## 2013-01-16 NOTE — Telephone Encounter (Signed)
[place] [facility] calling because they are unable to order Lipitor 20 mg for free from the manufacturer for new patients.  [facility] is able to get Crestor for free and needs to know if Dr. Berline Chough would be willing to change meds.  If so, will need new Rx.  Crestor 5 mg is comparable to Lipitor 20 mg.  Will send request to Dr. Berline Chough.  Gaylene Brooks, RN

## 2013-01-25 NOTE — Assessment & Plan Note (Signed)
No ulceration appreciated today

## 2013-01-25 NOTE — Assessment & Plan Note (Signed)
Dysphoric Ejacution & ED.  Likely secondary to DM2 poorly controlled No red flags and discussed red flags for followup

## 2013-01-25 NOTE — Assessment & Plan Note (Signed)
Non compliant with insulin and checking.  Reports will increase compliance.  NO changes given sig lack of compliance

## 2013-01-25 NOTE — Assessment & Plan Note (Signed)
>  consider increasing dose of neurontin. Not checking toes regularly. Dystrophic nails trimmed today

## 2013-01-25 NOTE — Assessment & Plan Note (Signed)
Needs eye appointment but not available through [facility].  Encouraged to make appointment if able

## 2013-01-25 NOTE — Assessment & Plan Note (Signed)
Stable/Well Controlled - no changes at this time. 

## 2013-02-11 ENCOUNTER — Ambulatory Visit (INDEPENDENT_AMBULATORY_CARE_PROVIDER_SITE_OTHER): Payer: Self-pay | Admitting: Sports Medicine

## 2013-02-11 ENCOUNTER — Encounter: Payer: Self-pay | Admitting: Sports Medicine

## 2013-02-11 VITALS — BP 130/78 | HR 88 | Temp 99.1°F | Ht 75.0 in | Wt 236.0 lb

## 2013-02-11 MED ORDER — MUPIROCIN 2 % EX OINT: TOPICAL_OINTMENT | Freq: Two times a day (BID) | CUTANEOUS | Status: DC

## 2013-02-11 NOTE — Patient Instructions (Addendum)
Check your sugars four times per day and bring a log with you to our next meeting. Please bring all medications you are currently taking with you to every office meeting.  Follow up in 1 month.  I have gotten you a prescription for Mupirocin - use this 2X per day  Abscess An abscess is an infected area that contains a collection of pus and debris.It can occur in almost any part of the body. An abscess is also known as a furuncle or boil. CAUSES  An abscess occurs when tissue gets infected. This can occur from blockage of oil or sweat glands, infection of hair follicles, or a minor injury to the skin. As the body tries to fight the infection, pus collects in the area and creates pressure under the skin. This pressure causes pain. People with weakened immune systems have difficulty fighting infections and get certain abscesses more often.  SYMPTOMS Usually an abscess develops on the skin and becomes a painful mass that is red, warm, and tender. If the abscess forms under the skin, you may feel a moveable soft area under the skin. Some abscesses break open (rupture) on their own, but most will continue to get worse without care. The infection can spread deeper into the body and eventually into the bloodstream, causing you to feel ill.  DIAGNOSIS  Your caregiver will take your medical history and perform a physical exam. A sample of fluid may also be taken from the abscess to determine what is causing your infection. TREATMENT  Your caregiver may prescribe antibiotic medicines to fight the infection. However, taking antibiotics alone usually does not cure an abscess. Your caregiver may need to make a small cut (incision) in the abscess to drain the pus. In some cases, gauze is packed into the abscess to reduce pain and to continue draining the area. HOME CARE INSTRUCTIONS   Only take over-the-counter or prescription medicines for pain, discomfort, or fever as directed by your caregiver.  If you  were prescribed antibiotics, take them as directed. Finish them even if you start to feel better.  If gauze is used, follow your caregiver's directions for changing the gauze.  To avoid spreading the infection:  Keep your draining abscess covered with a bandage.  Wash your hands well.  Do not share personal care items, towels, or whirlpools with others.  Avoid skin contact with others.  Keep your skin and clothes clean around the abscess.  Keep all follow-up appointments as directed by your caregiver. SEEK MEDICAL CARE IF:   You have increased pain, swelling, redness, fluid drainage, or bleeding.  You have muscle aches, chills, or a general ill feeling.  You have a fever. MAKE SURE YOU:   Understand these instructions.  Will watch your condition.  Will get help right away if you are not doing well or get worse. Document Released: 08/22/2005 Document Revised: 05/13/2012 Document Reviewed: 01/25/2012 [facility] Patient Information 2013 [place], [place].   General Instructions:  Follow up with Arlys Donnell, Health Coach [phone]  In one week  Treatment Goals:  Goals (1 Years of Data) as of 02/11/13   None      Progress Toward Treatment Goals:  Treatment Goal 02/11/2013  Hemoglobin A1C unchanged  Blood pressure at goal    Self Care Goals & Plans:  Self Care Goal 02/11/2013  Manage my medications take my medicines as prescribed; bring my medications to every visit  Monitor my health keep track of my blood glucose  Eat healthy foods  eat more vegetables; eat smaller portions  Other take insulin everyday- follow up weekly with health coach    Home Blood Glucose Monitoring 02/11/2013  Check my blood sugar once a day  When to check my blood sugar before breakfast     Care Management & Community Referrals:  Referral 02/11/2013  Referrals made for care management support health educator

## 2013-02-13 ENCOUNTER — Ambulatory Visit: Payer: Self-pay | Admitting: Sports Medicine

## 2013-02-17 DIAGNOSIS — L03319 Cellulitis of trunk, unspecified: Secondary | ICD-10-CM | POA: Insufficient documentation

## 2013-02-17 NOTE — Assessment & Plan Note (Signed)
Extremely very poorly controlled with difficulty with compliance with medications.  Patient is very non-the middle about his desires for treatment does not appear to comprehend his risk of cardiovascular or renal disease.  He insisted all hemostasis prevent his kidneys are getting back but does not draw the association between his A1c that is very poorly controlled greater than 12 (which he reports is good for him) and the likelihood of progressive diabetic nephropathy, diabetic neuropathy, diabetic retinopathy as well as increased cardiovascular risk.  He did meet with the health educator's today who were very beneficial and helpful and were able to set a goal for him to be able to take his Lantus on a daily basis.  There is little need to change his current regimen as he is noncompliant with this at this time.  Goal is to see him check his sugars on a daily basis (I would like to see him like up to 4 times per day but recognize that even once per day would be an improvement) & to take his Lantus on a daily basis.  At this point sliding-scale insulin I think would only confluent picture and until there is improved patient compliance and by and will defer additional insulin therapy at this time.

## 2013-02-17 NOTE — Assessment & Plan Note (Signed)
Stable/Well Controlled - no changes at this time. 

## 2013-02-17 NOTE — Assessment & Plan Note (Signed)
Multiple history of infections.  No systemic signs or symptoms.  Clinically improving according to patient. We'll provide Bactroban for MRSA coverage.  Clean dressings daily

## 2013-02-17 NOTE — Progress Notes (Signed)
  [facility]  Patient name: Caleb Joseph MRN [id number]  Date of birth: [birth year]  CC & HPI:  Caleb Joseph is a 57 y.o. male presenting today for follow up of:  #  Hypertension - chronic problem, adequately controlled.    Home BP checks/ranges: does not check  no orthostasis  no chest pain, no dyspnea on exertion, no orthopnea/PND, no peripheral edema,   no episodes of unilateral weakness, dysarthria or acute visual changes  #  Diabetes - chronic problem, poorly controlled.   Blood Sugar checks/ranges: only occasionally checks, usually high 200s to 300s with is reported as "good for him"   Reports some "hypoglycemic symptoms/episodes" but has never verified with CBG and reports these "low" episodes are present when CBGs are in high 100s to 200s  stable poluria, stable polydipsia,  no new visual problems, poor vision at baseline  last eye exam: >3 months ago.  Has appointment with Ophthalmologist  LE dysesthesias: Yes - but unchaged  self foot checks performed: Daily  ------------------------------------------------------------------------------------------------------------------ Medication Compliance: noncompliant much of the time  Diet Compliance: noncompliant much of the time ------------------------------------------------------------------------------------------------------------------ New Concerns:  # Back Abscess: Reports abscess on his left scapular region.  Reports that this is draining currently.  Has been present for greater than one week.  Currently treating this with triple antibiotic ointment.  Continues to be painful and indurated.   is actively draining.  Denies any fevers chills.  Denies any rigors..  Denies significant pain.   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 Medications: Reviewed & Updated - See associated section in EMR Social History: Reviewed -  reports that he has been smoking Cigars and Pipe.  His smokeless tobacco use includes Chew and Snuff.   Objective Findings:  Vitals: BP 130/78  Pulse 88  Temp(Src) 99.1 F (37.3 C) (Oral)  Ht 6\' 3"  (1.905 m)  Wt 236 lb (107.049 kg)  BMI 29.5 kg/m2  PE: GENERAL:  Adult well-built Caucasian male. In no discomfort; no respiratory distress. PSYCH: Alert and appropriately interactive; Insight:Fair and Lacking   H&N: AT/[place], trachea midline EENT:  MMM, no scleral icterus, EOMi HEART: RRR, S1/S2 heard, no murmur LUNGS: CTA B, no wheezes, no crackles EXTREMITIES: Moves all 4 extremities spontaneously, warm well perfused, no edema, manner of exam deferred. Back: Aproximate 2 cm abscess with central area of eschar formation.  With some draining purulent exudate, covered in clean.  Small border of your edema and induration, no fluctuance.   Assessment & Plan:

## 2013-02-17 NOTE — Assessment & Plan Note (Signed)
Patient seen by health educators today and patient's and [facility] completed. Please see above Greater than 25 minutes in direct patient counseling was spent with this patient during this visit

## 2013-02-25 ENCOUNTER — Telehealth: Payer: Self-pay | Admitting: Home Health Services

## 2013-02-25 NOTE — Telephone Encounter (Signed)
Spoke with Caleb Joseph.  Pt reports taking insulin/novolog sporadically.  Pt reports taking most oral medications when he has them.  He is currently taking metformin for DM. BG values 02/19/13: 593 02/25/13: 282  Pt reports that when he becomes constipated that he has to stop insulin/novolog for a few days until his system get's back to normal. We talked about how dangerous it is to have BG that high and the importance of monitoring his BG to know where he is at.  Pt reports diet high in refined carbohydrate.  We talked about food that raise BG and foods that could help keep it stable.    Pt set goal to check BG 2x a day and to go grocery shopping to buy the right types of foods to eat.  Pt's overall goal is dm management.

## 2013-03-17 ENCOUNTER — Encounter: Payer: Self-pay | Admitting: Sports Medicine

## 2013-03-17 ENCOUNTER — Ambulatory Visit (INDEPENDENT_AMBULATORY_CARE_PROVIDER_SITE_OTHER): Payer: No Typology Code available for payment source | Admitting: Sports Medicine

## 2013-03-17 VITALS — BP 133/74 | HR 94 | Temp 98.9°F | Resp 18 | Ht 75.0 in | Wt 238.0 lb

## 2013-03-17 DIAGNOSIS — L723 Sebaceous cyst: Secondary | ICD-10-CM

## 2013-03-17 DIAGNOSIS — I10 Essential (primary) hypertension: Secondary | ICD-10-CM

## 2013-03-17 DIAGNOSIS — L720 Epidermal cyst: Secondary | ICD-10-CM | POA: Insufficient documentation

## 2013-03-17 DIAGNOSIS — D239 Other benign neoplasm of skin, unspecified: Secondary | ICD-10-CM | POA: Insufficient documentation

## 2013-03-17 DIAGNOSIS — L989 Disorder of the skin and subcutaneous tissue, unspecified: Secondary | ICD-10-CM

## 2013-03-17 DIAGNOSIS — E1159 Type 2 diabetes mellitus with other circulatory complications: Secondary | ICD-10-CM

## 2013-03-17 MED ORDER — INSULIN GLARGINE 100 UNIT/ML ~~LOC~~ SOLN: 40.0000 [IU] | Freq: Two times a day (BID) | SUBCUTANEOUS | Status: DC

## 2013-03-17 MED ORDER — METFORMIN HCL 500 MG PO TABS: 1000.0000 mg | ORAL_TABLET | Freq: Two times a day (BID) | ORAL | Status: DC

## 2013-03-17 MED ORDER — ROSUVASTATIN CALCIUM 5 MG PO TABS: 5.0000 mg | ORAL_TABLET | Freq: Every day | ORAL | Status: AC

## 2013-03-17 MED ORDER — ASPIRIN 81 MG PO TABS: 81.0000 mg | ORAL_TABLET | Freq: Every day | ORAL | Status: DC

## 2013-03-17 MED ORDER — BENAZEPRIL HCL 5 MG PO TABS: 5.0000 mg | ORAL_TABLET | Freq: Every day | ORAL | Status: DC

## 2013-03-17 NOTE — Assessment & Plan Note (Signed)
Dysplastic appearing nevus adjacent to the epidermal inclusion cyst.   >will plan for biopsy at time of cyst excision.

## 2013-03-17 NOTE — Assessment & Plan Note (Signed)
>   Patient to be scheduled for excisional removal.

## 2013-03-17 NOTE — Assessment & Plan Note (Signed)
Patient is not been on aspirin.  Has been instructed to start today. Patient was unable to fill Crestor.  Given a prescription again today to take them out. Has not been on an ACE due to cost.  Given prescription for Benazapril (4$ at [facility]) We'll change Lantus to 40 units twice a day.  Continues to have extraordinarily high sugars.  Still has difficulty making connection between worsening neuropathy and nephropathy as well as increased risk of cardiovascular disease due to his sugars. Goal is for patient to check sugars at least once per day ideally 4 times per day.  He is noncompliant with his QAC & QHS novolog Dosing.  Talk with [facility] Coach after meeting today.

## 2013-03-17 NOTE — Patient Instructions (Addendum)
It was nice to see you today.   Today we discussed: 1. DIABETES MELLITUS, TYPE II, ON INSULIN, UNCONTROLLED Here are your new medications: - benazepril (LOTENSIN) 5 MG tablet; Take 1 tablet (5 mg total) by mouth daily.  Dispense: 30 tablet; Refill: 11 - metFORMIN (GLUCOPHAGE) 500 MG tablet; Take 2 tablets (1,000 mg total) by mouth 2 (two) times daily with a meal.  Dispense: 180 tablet; Refill: 3 - rosuvastatin (CRESTOR) 5 MG tablet; Take 1 tablet (5 mg total) by mouth daily.  Dispense: 30 tablet; Refill: 11 - aspirin 81 MG tablet; Take 1 tablet (81 mg total) by mouth daily.  Dispense: 30 tablet - insulin glargine (LANTUS) 100 UNIT/ML injection; Inject 0.4 mLs (40 Units total) into the skin 2 (two) times daily.  Dispense: 10 mL; Refill: 5  2. Epidermal inclusion cyst Schedule a follow up appointment to have it removed  3. Skin lesion of back We will biopsy this at the same time as above.   Please plan to return to see me in 1 month.  If you need anything prior to seeing me please call the clinic.  Please Bring all medications with you to each appointment.

## 2013-03-17 NOTE — Progress Notes (Signed)
  [facility]  Patient name: Caleb Joseph MRN [id number]  Date of birth: January 11, 1956  CC & HPI:  Caleb Joseph is a 57 y.o. male presenting today for follow up of:  #  Hypertension - chronic problem, adequately controlled.    Home BP checks/ranges: does not check  no orthostasis  no chest pain, no dyspnea on exertion, no orthopnea/PND, no peripheral edema,   no episodes of unilateral weakness, dysarthria or acute visual changes  #  Diabetes - chronic problem, poorly controlled.   Blood Sugar checks/ranges: reports checking when he has food but didn't have regular food supply for a couple of days this month due to no transportation - Car is now fixed.  Usually in 300s with occasionally reading "High".   Reports some "hypoglycemic symptoms/episodes" but sugars usually in 300-400s during this time.  Had one value in 150s.  Does not take insulin when lack of food  stable poluria, stable polydipsia,  no new visual problems, poor vision at baseline  last eye exam: >3 months ago.  Has appointment with Ophthalmologist  LE dysesthesias: Yes - but unchaged  self foot checks performed: Daily  ------------------------------------------------------------------------------------------------------------------ Medication Compliance: noncompliant much of the time  Diet Compliance: noncompliant much of the time ------------------------------------------------------------------------------------------------------------------ New Concerns:  # Skin Lesions: Reports a cyst on the right mid back.  It does become bothersome and inflamed on occasion like it removed.  Has had prior similar ones that had been excised.  No known prior skin cancers does have a history of prolonged and significant sun exposure.  Also has hyperpigmented lesion on the tip of the nose that has Has not been changing according to patient.   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 Medications: Reviewed & Updated - See associated section in EMR Social History: Reviewed -  reports that he has been smoking Cigars and Pipe.  His smokeless tobacco use includes Chew and Snuff.   Objective Findings:  Vitals: BP 133/74  Pulse 94  Temp(Src) 98.9 F (37.2 C) (Oral)  Resp 18  Ht 6\' 3"  (1.905 m)  Wt 238 lb (107.956 kg)  BMI 29.75 kg/m2  SpO2 100%  PE: GENERAL:  Adult well-built Caucasian male. In no discomfort; no respiratory distress. PSYCH: Alert and appropriately interactive; Insight:Fair and Lacking   H&N: AT/[place], trachea midline EENT:  MMM, no scleral icterus, EOMi HEART: RRR, S1/S2 heard, no murmur LUNGS: CTA B, no wheezes, no crackles EXTREMITIES: Moves all 4 extremities spontaneously, warm well perfused, no edema, manner of exam deferred. Back: Aproximate 1.5 cm cystic nodule with mild erythema, no purulence on R lower parascapular region.  Also inferior and lateral to this cyst there is a hyperpigmented lesion with irregular borders is approximately half centimeter in size.  He also has a hyperpigmented lesion on the tip of his nose that has regular borders that is darkly colored.   Assessment & Plan:

## 2013-03-19 ENCOUNTER — Telehealth: Payer: Self-pay | Admitting: Sports Medicine

## 2013-03-19 NOTE — Telephone Encounter (Signed)
Patient states you discussed prescribing   a less expensive BP medication and that [facility] had it for $4,if that's the case he would like a written rx to pick up.thank you. Caleb Joseph, Caleb Joseph

## 2013-03-19 NOTE — Telephone Encounter (Signed)
Pt was informed. Caleb Joseph S  

## 2013-03-19 NOTE — Assessment & Plan Note (Signed)
Improved today.  Rx for Benazapril.  Needed for renal protection.

## 2013-03-19 NOTE — Telephone Encounter (Addendum)
The prescription is at [facility] for Benazapril.  It was sent during the visit.  I told him this during the visit.  No reason to print.  It is at the [facility] on [address] and is likely ready to be picked up  Please inform

## 2013-03-19 NOTE — Telephone Encounter (Signed)
Patient is calling because he was supposed to have an Rx for BP medication, but he didn't get it.  He would like it written, and he would like to be called and he will come and pick it up.

## 2013-04-02 ENCOUNTER — Telehealth: Payer: Self-pay | Admitting: Sports Medicine

## 2013-04-02 NOTE — Telephone Encounter (Signed)
Pt's [facility] records placed in to be called.  Please call and arrange pick up if pt would like their records.  Otherwise okay to destroy as all pertinent information copied into EPIC.

## 2013-04-13 ENCOUNTER — Telehealth: Payer: Self-pay | Admitting: Home Health Services

## 2013-04-13 NOTE — Telephone Encounter (Signed)
Spoke with Caleb Joseph Pt reports feeling: fair Pt reports taking medications no  Has been out of metformin, reports taking all other medications daily as prescribed. Patient missed taking medications 0 days this week.   Pt is self monitoring their DM:yes medication compliance: compliant most of the time, diabetic diet compliance:, only eats 2 meals a day, now eat an apple before going to bed, home glucose monitoring: is performed regularly ranges usually above 200.  Reports feeling shaky when below 200.   Last weeks goals:take all medications as prescribed. Focus on eating more protein and less carb rich foods. Pt was successful with last week's goals: pt has reported markable improvement with eating more fruit and timing food before going to bed. This weeks goals: take all medications as prescribed. Focus on eating more protein and less carb rich foods. Pt's overall goal is: dm management.

## 2013-04-14 ENCOUNTER — Telehealth: Payer: Self-pay | Admitting: Sports Medicine

## 2013-04-14 DIAGNOSIS — E1159 Type 2 diabetes mellitus with other circulatory complications: Secondary | ICD-10-CM

## 2013-04-14 MED ORDER — METFORMIN HCL 1000 MG PO TABS: 1000.0000 mg | ORAL_TABLET | Freq: Two times a day (BID) | ORAL | Status: DC

## 2013-04-14 NOTE — Telephone Encounter (Signed)
Pt. States is out of metformin and [facility] BUT, does not have money until 04/28/13 to pick up any more metformin.  Pt. States taking insulin 40 in am and 40 at bedtime and "that is keeping (his) sugars under control"  Pt has appt May 28 with Dr. Berline Chough and will pick up Rx for metformin then.  Lestine Rahe L

## 2013-04-14 NOTE — Telephone Encounter (Signed)
Rx for 1000mg  tabs bid X 180 with 3 refills sent to [facility].  Please call and ensure patient still using [facility] at [facility].  [facility] at [facility] I am happy to print out Rx for pt to pick up

## 2013-04-14 NOTE — Telephone Encounter (Signed)
Message copied by Andrena Mews on Tue Apr 14, 2013  8:55 AM ------      Message from: Oneal Grout      Created: Mon Apr 13, 2013  3:58 PM      Regarding: metformin refill       Spoke with [name]. Pt said you increased his metformin and he ran out about 10 days ago.  Needs refill for 30 days. ------

## 2013-04-14 NOTE — Telephone Encounter (Signed)
Although greater than 200 is less than ideal this is better than greater than 300 which it has been in the past.  I look forward to seeing him in a month and will provide him a printed refill on metformin at that time.  He declines come and pick up earlier refill

## 2013-04-22 ENCOUNTER — Ambulatory Visit (INDEPENDENT_AMBULATORY_CARE_PROVIDER_SITE_OTHER): Payer: No Typology Code available for payment source | Admitting: Sports Medicine

## 2013-04-22 ENCOUNTER — Encounter: Payer: Self-pay | Admitting: Sports Medicine

## 2013-04-22 VITALS — BP 105/66 | HR 83 | Temp 98.2°F | Ht 75.0 in | Wt 241.0 lb

## 2013-04-22 DIAGNOSIS — E119 Type 2 diabetes mellitus without complications: Secondary | ICD-10-CM

## 2013-04-22 DIAGNOSIS — F329 Major depressive disorder, single episode, unspecified: Secondary | ICD-10-CM

## 2013-04-22 DIAGNOSIS — L723 Sebaceous cyst: Secondary | ICD-10-CM

## 2013-04-22 DIAGNOSIS — I10 Essential (primary) hypertension: Secondary | ICD-10-CM

## 2013-04-22 DIAGNOSIS — E871 Hypo-osmolality and hyponatremia: Secondary | ICD-10-CM

## 2013-04-22 DIAGNOSIS — E1159 Type 2 diabetes mellitus with other circulatory complications: Secondary | ICD-10-CM

## 2013-04-22 DIAGNOSIS — L720 Epidermal cyst: Secondary | ICD-10-CM

## 2013-04-22 LAB — CBC
Hemoglobin: 13.6 g/dL (ref 13.0–17.0)
MCH: 29.3 pg (ref 26.0–34.0)
MCV: 86.6 fL (ref 78.0–100.0)
RBC: 4.64 MIL/uL (ref 4.22–5.81)

## 2013-04-22 LAB — COMPREHENSIVE METABOLIC PANEL
Alkaline Phosphatase: 64 U/L (ref 39–117)
BUN: 22 mg/dL (ref 6–23)
Glucose, Bld: 261 mg/dL — ABNORMAL HIGH (ref 70–99)
Sodium: 126 mEq/L — ABNORMAL LOW (ref 135–145)
Total Bilirubin: 1 mg/dL (ref 0.3–1.2)
Total Protein: 7.8 g/dL (ref 6.0–8.3)

## 2013-04-22 MED ORDER — METFORMIN HCL 1000 MG PO TABS: 1000.0000 mg | ORAL_TABLET | Freq: Two times a day (BID) | ORAL | Status: DC

## 2013-04-22 NOTE — Assessment & Plan Note (Signed)
Will have come back for a procedure day

## 2013-04-22 NOTE — Assessment & Plan Note (Addendum)
Check depakote level Unsure if his liver enzymes have been checked.  Will check them as well as Depakote and CBC

## 2013-04-22 NOTE — Assessment & Plan Note (Addendum)
Some improvement with his A1c.  Patient still unwilling do NovoLog sliding scale on a regular basis.  His sugars have slightly come down and he does report dizziness orthostasis that he attributes to his blood sugar going to 115.  He does has a have profound orthostatic vital signs to explain his weakness more.  Please see hypertension.  No changes to his diabetic regimen continue to encourage medication compliance.  Followup with Rosalita Chessman.  Followup in one month. Refill metformin 1000 twice a day

## 2013-04-22 NOTE — Progress Notes (Signed)
  [facility]  Patient name: Caleb Joseph MRN [id number]  Date of birth: [birth year]  CC & HPI:  Caleb Joseph is a 57 y.o. male presenting today for follow up of:  #  Hypertension - chronic problem, too well controlled.    Home BP checks/ranges: 120s - 100s  Profound orthostasis; also documentation of orthostatic vital signs per patient.  He produces close to twice daily vital signs including sitting and standing with significant drops in systolic blood pressure with standing on occasion  no chest pain, no dyspnea on exertion, no orthopnea/PND, no peripheral edema,   no episodes of unilateral weakness, dysarthria or acute visual changes  #  Diabetes - chronic problem, poorly controlled.   Blood Sugar checks/ranges: Fasting 115 - 200s ; 300s-400s  no hypoglycemia noted, however patient feels like his sugar is dropping to low 1 is 115 or 160.    no poluria, no polydipsia,  no new visual problems, LE dysesthesias: Yes - chronic stable  self foot checks performed: Weekly  #  Hyperlipidemia - chronic problem, very well controlled.    Reports no RUQ pain, no Muscle Aches    # Depression: Continues to be followed by a [facility].  He is on Depakote and Cymbalta.  Neurontin at night. Reports that he feels like he is doing significantly better than in the past and is happy with the care he is receiving there. ------------------------------------------------------------------------------------------------------------------ Medication Compliance: compliant most of the time  Diet Compliance: compliant most of the time  ROS:  PER HPI  Pertinent History Reviewed:  Medical & Surgical Hx:  Reviewed: Significant for poor, peripheral neuropathy Medications: Reviewed & Updated - See associated section in EMR Social History: Reviewed -  reports that he has been smoking Cigars and Pipe.  His smokeless tobacco use includes Chew and Snuff.   Objective Findings:  Vitals: BP 105/66   Pulse 83  Temp(Src) 98.2 F (36.8 C) (Oral)  Ht 6\' 3"  (1.905 m)  Wt 241 lb (109.317 kg)  BMI 30.12 kg/m2  PE: GENERAL:  Adult well built  male. In no discomfort; no respiratory distress. PSYCH: Alert and appropriately interactive; Insight:Fair   H&N: AT/[place], trachea midline EENT:  MMM, no scleral icterus, EOMi HEART: RRR, S1/S2 heard, no murmur LUNGS: CTA B, no wheezes, no crackles EXTREMITIES: Moves all 4 extremities spontaneously, warm well perfused, no edema, bilateral DP and PT pulses 2/4.   Skin: unchanged lesions from last visit, non-erythematous nodule @ R scapular edge    Assessment & Plan:

## 2013-04-22 NOTE — Assessment & Plan Note (Addendum)
Patient presents with profound orthostasis on home Long's as well as relative hypotension today.  We'll stop his Benazapril for now.  Obtain a microalbumin.  Okay to hold off on ACE/ARB if neg microalbumin given increased fall risk Consider Autonomic Neuropathy

## 2013-04-22 NOTE — Patient Instructions (Addendum)
It was nice to see you today.   Today we discussed: 1. Diabetes Your A1c today was better.  It was 10. I have refilled your metformin.  You will now take 1000 mg (1 tablet) twice a day.  2. DEPRESSION I am checking labs today for your medications.  I will call if anything is abnormal. - Valproic acid level, free - Comprehensive metabolic panel - CBC  4. HYPERTENSION Stop your BENAZAPRIL, drink plenty of fluids  5. Epidermal inclusion cyst Please schedule a procedure visit to have your inclusion cyst taken off.  Please let the front desk know your wanting this removed   Please plan to return to see me in 1 month.  If you need anything prior to seeing me please call the clinic.  Please Bring all medications with you to each appointment.

## 2013-04-23 LAB — MICROALBUMIN / CREATININE URINE RATIO: Microalb Creat Ratio: 86.1 mg/g — ABNORMAL HIGH (ref 0.0–30.0)

## 2013-04-24 NOTE — Addendum Note (Signed)
Addended by: Gaspar Bidding D on: 04/24/2013 06:31 PM   Modules accepted: Orders

## 2013-04-24 NOTE — Progress Notes (Signed)
Moderate HYPONATREMIA:  I called and left a voicemail for patient at the number provided.  His sodium is markedly lower than what it had been.  This is likely partially due to dehydration given his profound orthostatic vital signs that were noted on his home recordings.  I have encouraged him to consume salt over the weekend and to followup at his earliest convenience early next week to have a repeat labs drawn.  He has been directed to stop taking his lisinopril.  However given the elevated microalbumin he will ultimately need to be on an ACEi or ARB for renal protection.

## 2013-04-29 ENCOUNTER — Other Ambulatory Visit: Payer: No Typology Code available for payment source

## 2013-04-29 DIAGNOSIS — I10 Essential (primary) hypertension: Secondary | ICD-10-CM

## 2013-04-29 LAB — BASIC METABOLIC PANEL
Calcium: 8.9 mg/dL (ref 8.4–10.5)
Creat: 1.34 mg/dL (ref 0.50–1.35)
Sodium: 137 mEq/L (ref 135–145)

## 2013-04-29 NOTE — Progress Notes (Signed)
BMP DONE TODAY Zyairah Wacha 

## 2013-05-20 ENCOUNTER — Ambulatory Visit: Payer: Self-pay | Admitting: Sports Medicine

## 2013-05-21 ENCOUNTER — Telehealth: Payer: Self-pay | Admitting: Home Health Services

## 2013-05-21 NOTE — Telephone Encounter (Signed)
Spoke with Caleb Joseph Pt reports feeling: good Pt reports taking medications yes Patient missed taking medications 1 days this week.   Pt reports taking all medications regularly with the exception of metformin and adjusting lantus/novolog based on bg values.   Pt is self monitoring their DM:yes medication compliance: compliant most of the time, diabetic diet compliance: compliant most of the time, home glucose monitoring: is performed regularly  Range is 186 to >600  Pt is self monitoring their HTN:yes range 107/71 to 135/86  Pt reported having sore on foot, reports going to doctor and having it treated. I am not sure what doctor he went to.  Last weeks goals:limit carbs, take all mediations as prescribed Pt was successful with last week's goals: no This weeks goals: limit carbs, take all medications as prescribed Pt's overall goal is: dm managment

## 2013-05-27 ENCOUNTER — Encounter: Payer: Self-pay | Admitting: Sports Medicine

## 2013-05-27 ENCOUNTER — Ambulatory Visit (INDEPENDENT_AMBULATORY_CARE_PROVIDER_SITE_OTHER): Payer: No Typology Code available for payment source | Admitting: Sports Medicine

## 2013-05-27 VITALS — BP 130/69 | HR 78 | Temp 98.6°F | Ht 75.0 in | Wt 251.0 lb

## 2013-05-27 DIAGNOSIS — L989 Disorder of the skin and subcutaneous tissue, unspecified: Secondary | ICD-10-CM

## 2013-05-27 DIAGNOSIS — L723 Sebaceous cyst: Secondary | ICD-10-CM

## 2013-05-27 DIAGNOSIS — L720 Epidermal cyst: Secondary | ICD-10-CM

## 2013-05-27 NOTE — Progress Notes (Signed)
  [name]  Patient name: Caleb Joseph MRN [id number]  Date of birth: [birth year]  CC & HPI:  Caleb Joseph is a 57 y.o. male presenting today for cyst removal and skin lesion biopsy:  Please see associated Problem associated notes   ROS:  No fevers, no chills, sugars improved, no syncope, no chest pain, no dysnpea  Pertinent History Reviewed:  Medical & Surgical Hx:  Reviewed: Significant for DM poorly controlled, HTN, Foot lesions currently on Bactrim per Podiatrist Medications: Reviewed & Updated - see associated section Social History: Reviewed -  reports that he has been smoking Cigars and Pipe.  His smokeless tobacco use includes Chew and Snuff.  Objective Findings:  Vitals: BP 130/69  Pulse 78  Temp(Src) 98.6 F (37 C) (Oral)  Ht 6\' 3"  (1.905 m)  Wt 251 lb (113.853 kg)  BMI 31.37 kg/m2  See associated notes for exam  Assessment & Plan:

## 2013-05-27 NOTE — Patient Instructions (Addendum)
It was good to see you. Please return in 1 week for your stitches to be removed I have given you a print out of your labs.  Biopsy Care After Refer to this sheet in the next few weeks. These instructions provide you with information on caring for yourself after your procedure. Your caregiver may also give you more specific instructions. Your treatment has been planned according to current medical practices, but problems sometimes occur. Call your caregiver if you have any problems or questions after your procedure. If you had a fine needle biopsy, you may have soreness at the biopsy site for 1 to 2 days. If you had an open biopsy, you may have soreness at the biopsy site for 3 to 4 days. HOME CARE INSTRUCTIONS   You may resume normal diet and activities as directed.  Change bandages (dressings) as directed. If your wound was closed with a skin glue (adhesive), it will wear off and begin to peel in 7 days.  Only take over-the-counter or prescription medicines for pain, discomfort, or fever as directed by your caregiver.  Ask your caregiver when you can bathe and get your wound wet. SEEK IMMEDIATE MEDICAL CARE IF:   You have increased bleeding (more than a small spot) from the biopsy site.  You notice redness, swelling, or increasing pain at the biopsy site.  You have pus coming from the biopsy site.  You have a fever.  You notice a bad smell coming from the biopsy site or dressing.  You have a rash, have difficulty breathing, or have any allergic problems. MAKE SURE YOU:   Understand these instructions.  Will watch your condition.  Will get help right away if you are not doing well or get worse. Document Released: 06/01/2005 Document Revised: 02/04/2012 Document Reviewed: 05/10/2011 [facility] Patient Information 2014 [place], [place].   Incision and Drainage Incision and drainage is a procedure in which a sac-like structure (cystic structure) is opened and drained. The area to  be drained usually contains material such as pus, fluid, or blood.  LET YOUR CAREGIVER KNOW ABOUT:   Allergies to medicine.  Medicines taken, including vitamins, herbs, eyedrops, over-the-counter medicines, and creams.  Use of steroids (by mouth or creams).  Previous problems with anesthetics or numbing medicines.  History of bleeding problems or blood clots.  Previous surgery.  Other health problems, including diabetes and kidney problems.  Possibility of pregnancy, if this applies. RISKS AND COMPLICATIONS  Pain.  Bleeding.  Scarring.  Infection. BEFORE THE PROCEDURE  You may need to have an ultrasound or other imaging tests to see how large or deep your cystic structure is. Blood tests may also be used to determine if you have an infection or how severe the infection is. You may need to have a tetanus shot. PROCEDURE  The affected area is cleaned with a cleaning fluid. The cyst area will then be numbed with a medicine (local anesthetic). A small incision will be made in the cystic structure. A syringe or catheter may be used to drain the contents of the cystic structure, or the contents may be squeezed out. The area will then be flushed with a cleansing solution. After cleansing the area, it is often gently packed with a gauze or another wound dressing. Once it is packed, it will be covered with gauze and tape or some other type of wound dressing. AFTER THE PROCEDURE   Often, you will be allowed to go home right after the procedure.  You may be  given antibiotic medicine to prevent or heal an infection.  If the area was packed with gauze or some other wound dressing, you will likely need to come back in 1 to 2 days to get it removed.  The area should heal in about 14 days. Document Released: 05/08/2001 Document Revised: 05/13/2012 Document Reviewed: 01/07/2012 [facility] Patient Information 2014 [place], [place].

## 2013-05-28 MED ORDER — SULFAMETHOXAZOLE-TRIMETHOPRIM 400-80 MG PO TABS: 1.0000 | ORAL_TABLET | Freq: Two times a day (BID) | ORAL | Status: AC

## 2013-05-28 NOTE — Assessment & Plan Note (Addendum)
SUBJECTIVE:  Caleb Joseph is a 57 y.o. male who presents for lesion removal. We have already discussed this procedure, including option of not performing surgery, technique of surgery and potential for scarring at a recent visit.  OBJECTIVE:  Patient appears well. Vitals are normal. 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 Cyst was removed in whole.  3 3-0 sutures (1 vertical central mattress; 2 simple interrupted) for closure.  Antibiotic dressing is applied, and wound care instructions provided.  Be alert for any signs of cutaneous infection. The procedure was well tolerated without complications. Follow up: return for suture removal in 7 days.  Pt is currently on TMP/SMX for foot lesion, to continue for additional 3 days.

## 2013-05-28 NOTE — Assessment & Plan Note (Signed)
After informed written consent was obtained, using Betadine for cleansing and 2% Lidocaine with epinephrine for anesthetic, with sterile technique a 4 mm punch biopsy was used to obtain a biopsy specimen of the lesion. Hemostasis was obtained by pressure and wound was sutured with 1 simple interupped 3-0 prolene suture. Antibiotic dressing is applied, and wound care instructions provided. Be alert for any signs of cutaneous infection. The specimen is labeled and sent to pathology for evaluation. The procedure was well tolerated without complications.

## 2013-06-03 ENCOUNTER — Ambulatory Visit (INDEPENDENT_AMBULATORY_CARE_PROVIDER_SITE_OTHER): Payer: No Typology Code available for payment source | Admitting: Sports Medicine

## 2013-06-03 VITALS — BP 125/74 | HR 90 | Temp 98.5°F | Ht 75.0 in | Wt 242.3 lb

## 2013-06-03 DIAGNOSIS — L723 Sebaceous cyst: Secondary | ICD-10-CM

## 2013-06-03 DIAGNOSIS — L989 Disorder of the skin and subcutaneous tissue, unspecified: Secondary | ICD-10-CM

## 2013-06-03 DIAGNOSIS — L720 Epidermal cyst: Secondary | ICD-10-CM

## 2013-06-03 NOTE — Progress Notes (Signed)
  [name]  Patient name: Caleb Joseph MRN [id number]  Date of birth: [birth year]  CC & HPI:  Caleb Joseph is a 57 y.o. male presenting today for   # Biopsy F/u: Patient underwent cyst excision and punch biopsy.  Cyst is healing well.  Patient does report some mild itching, no erythema.  Results reviewed today with patient  ROS:  Per history of present illness  Pertinent History Reviewed:  Medical & Surgical Hx:  Reviewed: Significant for poorly controlled diabetes, hypertension, significant sun exposure Medications: Reviewed & Updated - see associated section Social History: Reviewed -  reports that he has been smoking Cigars and Pipe.  His smokeless tobacco use includes Chew and Snuff.  Objective Findings:  Vitals: BP 125/74  Pulse 90  Temp(Src) 98.5 F (36.9 C) (Oral)  Ht 6\' 3"  (1.905 m)  Wt 242 lb 4.8 oz (109.907 kg)  BMI 30.29 kg/m2  PE: GENERAL:  adult male. In no discomfort; no respiratory distress  PSYCH:  alert and appropriate, good insight   HNEENT:    CARDIO:    LUNGS:    ABDOMEN:    EXTREM:   GU:   SKIN:  2 well healing lesions on right thoracic region.  Sutures are intact.  No surrounding erythema, mild suture reactions.  No pus, no fluctuance evidence of residual punch biopsy lesion present.    NEUROMSK:      Assessment & Plan:

## 2013-06-03 NOTE — Patient Instructions (Addendum)
It was good to see you today. Please schedule a follow up appointment in 2-3 weeks for excision of the lesion we biopsied and to discuss your diabetes.  Keep a Band-Aid and Mupirocin ointment on the lesions

## 2013-06-09 NOTE — Assessment & Plan Note (Addendum)
Discuss results with patient, atypical cells concerning for early malignancy. there are other concerning lesions including one on his nose at this time the patient does not wish for further workup however does agree for excision at this time.  Given patient's insurance status she is not eligible for referral and [place] and would likely need a tertiary care to have dermatology remove the lesion on the nose.  > Needs to return for excisional biopsy with margins,  > Consider tertiary care referral for dermatology

## 2013-06-09 NOTE — Assessment & Plan Note (Signed)
Well-healed, sutures removed

## 2013-06-11 ENCOUNTER — Telehealth: Payer: Self-pay | Admitting: Home Health Services

## 2013-06-11 NOTE — Telephone Encounter (Signed)
Spoke with Arwin Pt reports feeling: good Pt reports taking medications yes Patient missed taking medications 0 days this week.   Pt is self monitoring their DM:no medication compliance: compliant most of the time, diabetic diet compliance: noncompliant some of the time  Pt is self monitoring their HTN:yes did not bring log   Last weeks goals:eat more than 1 meal a day, take all meds as prescribed Pt was successful with last week's goals: yes This weeks goals: same Pt's overall goal is: dm/bp managment

## 2013-06-23 ENCOUNTER — Ambulatory Visit (INDEPENDENT_AMBULATORY_CARE_PROVIDER_SITE_OTHER): Payer: Self-pay | Admitting: Sports Medicine

## 2013-06-23 ENCOUNTER — Encounter: Payer: Self-pay | Admitting: Sports Medicine

## 2013-06-23 VITALS — BP 134/80 | HR 94 | Temp 98.6°F | Ht 75.0 in | Wt 247.0 lb

## 2013-06-23 DIAGNOSIS — D239 Other benign neoplasm of skin, unspecified: Secondary | ICD-10-CM

## 2013-06-23 DIAGNOSIS — L989 Disorder of the skin and subcutaneous tissue, unspecified: Secondary | ICD-10-CM

## 2013-06-23 DIAGNOSIS — E1159 Type 2 diabetes mellitus with other circulatory complications: Secondary | ICD-10-CM

## 2013-06-23 MED ORDER — SULFAMETHOXAZOLE-TMP DS 800-160 MG PO TABS: 1.0000 | ORAL_TABLET | Freq: Two times a day (BID) | ORAL | Status: DC

## 2013-06-23 NOTE — Progress Notes (Signed)
  [name]  Patient name: Caleb Joseph MRN [id number]  Date of birth: [birth year]  CC & HPI:  Caleb Joseph is a 57 y.o. male presenting today for the below procedure:   # Dysplastic Nevus Excisional Removal: Previous sponge biopsy of suspicious lesion on right upper back did show a severe dysplastic nevus without complete removal  # Punch Biopsy of Nevus Unknown Malignant Potential: irregular appearing nevus that is on R upper back that is below previously excised cyst with slightly irregular boarders.    Other Issues to discuss today: # #  Diabetes - chronic problem, poorly controlled.   Blood Sugar checks/ranges:    Having hypoglycemic symptoms/episodes.   no poluria, no polydipsia,  no new visual problems  last eye exam  LE dysesthesias: stable  no chest pain, no dyspnea on exertion, no orthopnea/PND, no peripheral edema,   no episodes of unilateral weakness, dysarthria or acute visual changes    ROS:  No Fevers, Chills.   No chest pain, dyspnea, orthostasis. No allergies to numbing medications  Pertinent History Reviewed:  Medical & Surgical Hx:  Reviewed: Significant for poorly controlled diabetes Medications: Reviewed & Updated - see associated section Social History: Reviewed -  reports that he has been smoking Cigars and Pipe.  His smokeless tobacco use includes Chew and Snuff.  Objective Findings:  Vitals: BP 134/80  Pulse 94  Temp(Src) 98.6 F (37 C) (Oral)  Ht 6\' 3"  (1.905 m)  Wt 247 lb (112.038 kg)  BMI 30.87 kg/m2  PE: GENERAL:  adult large framed male male. In no discomfort; no respiratory distress  PSYCH:  alert and appropriate, good insight   HNEENT:    CARDIO:  RRR, S1/S2 heard, no murmur  LUNGS:  CTA B, no wheezes, no crackles  ABDOMEN:    EXTREM:   GU:   SKIN:  is a well-healed cyst excision  There is a well-healed punch biopsy lesion with residual nevus present  There is a asymmetric and a hyperpigmented macule  below the level of the cyst.  It has irregular borders   NEUROMSK:        Assessment & Plan:

## 2013-06-23 NOTE — Patient Instructions (Addendum)
Please follow up in 1 week to get your sutures removed Leave tegaderm in place until it falls off.    Biopsy Care After Refer to this sheet in the next few weeks. These instructions provide you with information on caring for yourself after your procedure. Your caregiver may also give you more specific instructions. Your treatment has been planned according to current medical practices, but problems sometimes occur. Call your caregiver if you have any problems or questions after your procedure. If you had a fine needle biopsy, you may have soreness at the biopsy site for 1 to 2 days. If you had an open biopsy, you may have soreness at the biopsy site for 3 to 4 days. HOME CARE INSTRUCTIONS   You may resume normal diet and activities as directed.  Change bandages (dressings) as directed. If your wound was closed with a skin glue (adhesive), it will wear off and begin to peel in 7 days.  Only take over-the-counter or prescription medicines for pain, discomfort, or fever as directed by your caregiver.  Ask your caregiver when you can bathe and get your wound wet. SEEK IMMEDIATE MEDICAL CARE IF:   You have increased bleeding (more than a small spot) from the biopsy site.  You notice redness, swelling, or increasing pain at the biopsy site.  You have pus coming from the biopsy site.  You have a fever.  You notice a bad smell coming from the biopsy site or dressing.  You have a rash, have difficulty breathing, or have any allergic problems. MAKE SURE YOU:   Understand these instructions.  Will watch your condition.  Will get help right away if you are not doing well or get worse. Document Released: 06/01/2005 Document Revised: 02/04/2012 Document Reviewed: 05/10/2011 [facility] Patient Information 2014 [place], [place].

## 2013-07-01 ENCOUNTER — Encounter: Payer: Self-pay | Admitting: Sports Medicine

## 2013-07-01 ENCOUNTER — Ambulatory Visit (INDEPENDENT_AMBULATORY_CARE_PROVIDER_SITE_OTHER): Payer: Self-pay | Admitting: Sports Medicine

## 2013-07-01 VITALS — BP 126/68 | HR 83 | Temp 99.0°F | Ht 75.0 in | Wt 249.0 lb

## 2013-07-01 DIAGNOSIS — D239 Other benign neoplasm of skin, unspecified: Secondary | ICD-10-CM

## 2013-07-01 DIAGNOSIS — L989 Disorder of the skin and subcutaneous tissue, unspecified: Secondary | ICD-10-CM

## 2013-07-01 NOTE — Progress Notes (Signed)
  [name]  Patient name: Caleb Joseph MRN [id number]  Date of birth: 11-03-1956  CC & HPI:  TENOCH MCCLURE is a 57 y.o. male presenting to clinic.  Chief Complaint  Patient presents with  . Suture / Staple Removal Patient reports has been doing well since his excision and biopsy.  Reports he is having some pain over the site of does not have any fevers or chills.      ROS:  PER HPI  Pertinent History Reviewed:  Medical & Surgical Hx:  Reviewed: Significant for poorly controlled diabetes, labile hypertension Medications: Reviewed & Updated - see associated section Social History: Reviewed -  reports that he has been smoking Cigars and Pipe.  His smokeless tobacco use includes Chew and Snuff.  Objective Findings:  Vitals: BP 126/68  Pulse 83  Temp(Src) 99 F (37.2 C) (Oral)  Ht 6\' 3"  (1.905 m)  Wt 249 lb (112.946 kg)  BMI 31.12 kg/m2 PE: GENERAL:  adult Caucasian male. In no discomfort; no respiratory distress  PSYCH:  alert and appropriate, good insight   HNEENT:    CARDIO:    LUNGS:    ABDOMEN:    EXTREM:   GU:   SKIN:  well-healed excisional incision on right upper back with adjacent punch biopsy that is well healed.  Oozers were in place and removed without difficulty.  There is minimal granulation tissue that is evidence 1 sutures were removed and Steri-Strips were applied   NEUROMSK:     Assessment & Plan:  See problem associated charting

## 2013-07-01 NOTE — Assessment & Plan Note (Signed)
Pathology report reviewed with evidence of full excision severe dysplastic nevus. Punch biopsy showed congenital melanocytic nevus. Steri-Strips applied and leaving place until falloff

## 2013-07-01 NOTE — Patient Instructions (Signed)
Follow up one month

## 2013-07-02 NOTE — Assessment & Plan Note (Signed)
No change 

## 2013-07-02 NOTE — Assessment & Plan Note (Signed)
Excision of dysplastic nevus was performed.  After informed consent was obtained, using Betadine for cleansing and 2% Lidocaine with epinephrine for anesthetic, with sterile technique, (elliptical excision total length approximately 2 cm by 0.8 cm) was performed. Antibiotic dressing is applied, and wound care instructions provided.  Be alert for any signs of cutaneous infection. The procedure was well tolerated without complications. Follow up: the specimen is labeled and sent to pathology for evaluation, return for suture removal in 7 days.

## 2013-07-02 NOTE — Assessment & Plan Note (Signed)
After informed written consent was obtained, using Betadine for cleansing and 2% Lidocaine with epinephrine for anesthetic, with sterile technique a 3.5 mm punch biopsy was used to obtain a biopsy specimen of the lesion. Hemostasis was obtained by pressure and wound was sutured X 1. Antibiotic dressing is applied, and wound care instructions provided. Be alert for any signs of cutaneous infection. The specimen is labeled and sent to pathology for evaluation. The procedure was well tolerated without complications.

## 2013-07-07 ENCOUNTER — Ambulatory Visit: Payer: Self-pay

## 2013-07-20 ENCOUNTER — Telehealth: Payer: Self-pay | Admitting: Home Health Services

## 2013-07-20 NOTE — Telephone Encounter (Signed)
Spoke with Caleb Joseph.  Pt reports feeling okay.  Pt reports taking pills most days and forgets night dosage of Lantus for the past several days.  Reports blood glucose are as follows: 8/25 390 8/24 did not check 8/23 340  8/22 319   We talked about importance of remembering to take both pills and insulin. Pt said he is reaching burn out with recording stats, taking pills, and insulin.  We agreed that it can be difficult to maintain these behaviors and that it would always be his choice to take them or not however blood glucose levels over 300 were unacceptable and would lead to further DM complications.  Pt agreed.   Reported blood pressure readings: 8/25 123/74  Pt reported concern for a sore in his mouth.  Asked if he wanted to move up his next appointment with PCP, pt declined.  Pt has scheduled appointment on 08/03/13 with PCP.  Advised him to keep that appointment for review of sore.   Pt's overall goal is dm management.

## 2013-08-03 ENCOUNTER — Encounter: Payer: Self-pay | Admitting: Sports Medicine

## 2013-08-03 ENCOUNTER — Ambulatory Visit (INDEPENDENT_AMBULATORY_CARE_PROVIDER_SITE_OTHER): Payer: No Typology Code available for payment source | Admitting: Sports Medicine

## 2013-08-03 VITALS — BP 132/75 | HR 85 | Temp 99.1°F | Ht 75.0 in | Wt 247.7 lb

## 2013-08-03 DIAGNOSIS — I10 Essential (primary) hypertension: Secondary | ICD-10-CM

## 2013-08-03 DIAGNOSIS — E1159 Type 2 diabetes mellitus with other circulatory complications: Secondary | ICD-10-CM

## 2013-08-03 DIAGNOSIS — K029 Dental caries, unspecified: Secondary | ICD-10-CM

## 2013-08-03 DIAGNOSIS — E1149 Type 2 diabetes mellitus with other diabetic neurological complication: Secondary | ICD-10-CM

## 2013-08-03 DIAGNOSIS — F329 Major depressive disorder, single episode, unspecified: Secondary | ICD-10-CM

## 2013-08-03 DIAGNOSIS — K1370 Unspecified lesions of oral mucosa: Secondary | ICD-10-CM

## 2013-08-03 DIAGNOSIS — K1379 Other lesions of oral mucosa: Secondary | ICD-10-CM

## 2013-08-03 LAB — CBC WITH DIFFERENTIAL/PLATELET
Hemoglobin: 12.6 g/dL — ABNORMAL LOW (ref 13.0–17.0)
Lymphocytes Relative: 32 % (ref 12–46)
Lymphs Abs: 2.3 10*3/uL (ref 0.7–4.0)
MCV: 89.3 fL (ref 78.0–100.0)
Monocytes Relative: 8 % (ref 3–12)
Neutrophils Relative %: 57 % (ref 43–77)
Platelets: 139 10*3/uL — ABNORMAL LOW (ref 150–400)
RBC: 4.21 MIL/uL — ABNORMAL LOW (ref 4.22–5.81)
WBC: 7.1 10*3/uL (ref 4.0–10.5)

## 2013-08-03 LAB — POCT GLYCOSYLATED HEMOGLOBIN (HGB A1C): Hemoglobin A1C: 10

## 2013-08-03 MED ORDER — INSULIN GLARGINE 100 UNIT/ML ~~LOC~~ SOLN: 45.0000 [IU] | Freq: Two times a day (BID) | SUBCUTANEOUS | Status: DC

## 2013-08-03 MED ORDER — INSULIN ASPART 100 UNIT/ML ~~LOC~~ SOLN: 2.0000 [IU] | Freq: Three times a day (TID) | SUBCUTANEOUS | Status: DC

## 2013-08-03 MED ORDER — MUPIROCIN 2 % EX OINT: TOPICAL_OINTMENT | Freq: Two times a day (BID) | CUTANEOUS | Status: DC

## 2013-08-03 MED ORDER — DOXYCYCLINE HYCLATE 100 MG PO TABS: 100.0000 mg | ORAL_TABLET | Freq: Two times a day (BID) | ORAL | Status: DC

## 2013-08-03 NOTE — Progress Notes (Signed)
[name]  Patient name: Caleb Joseph MRN [id number]  Date of birth: 03-04-1956  CC & HPI:  Caleb Joseph is a 57 y.o. male presenting to clinic for routine follow up of his chronic medical conditions.    The patients history is remarkable for: Diabetes  very poorly controlled.    Difficulty with consistent compliance with multiple checks per day.   Continuing to make slow improvement in his compliance and working with Dola Factor on goals of care.  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    PSYCH  history of depression and likely bipolar disorder.  Followed by Vesta Mixer.   Skin   multiple dysplastic nevi (including 1 severe) on his back; no known history of melanoma or skin cancer..  There is a suspicious nevi on his nose that will require evaluation by dermatology given the location.  He is unable to arrange this at this time due to financial constraints.  Patient receives medications through the [facility].    #  Hypertension - chronic problem,  controlled.    Home BP checks/ranges: 90s/60s to 120s/80s   no orthostasis,   no peripheral edema  no chest pain, no dyspnea on exertion, no orthopnea/PND  no episodes of unilateral weakness, dysarthria or acute visual changes  #  Diabetes - chronic problem, poorly controlled.   Blood Sugar checks/ranges: 230s to 340s fasting   no hypoglycemic symptoms/episodes.   no poluria, no polydipsia,  no new visual problems  LE dysesthesias: Yes - unchanged  self foot checks performed: Daily  [facility] Compliance Diet: noncompliant much of the time  Exercise: noncompliant much of the time  MEDS: noncompliant much of the time    # Mouth lesion: Patient reports a two-month history of progressively enlarging mouth lesion on the right anterior palate. Pt denies any fevers, chills, or rigors.  He does have some associated pain but is not taking medications.  As keeping him up at night.  He is concerned about cancer but does not she is using his tobacco.     ROS:  PER HPI  Pertinent History Reviewed:  Medical & Surgical Hx:  Reviewed: Significant for  see above  Medications: Reviewed & Updated - see associated section Social History: Reviewed -  reports that he has been smoking Cigars and Pipe.  His smokeless tobacco use includes Chew and Snuff.  Objective Findings:  Vitals: BP 132/75  Pulse 85  Temp(Src) 99.1 F (37.3 C) (Oral)  Ht 6\' 3"  (1.905 m)  Wt 247 lb 11.2 oz (112.356 kg)  BMI 30.96 kg/m2 PE: GENERAL:  large framed  male. In no discomfort; no respiratory distress  PSYCH:  alert and appropriate, good insight   HNEENT:   there is a large dental abscess on the right  Anterior aspect of the palate adjacent to the right superior incisor.  The majority of his teeth have significant rot and decay down to the nerve root.  Denies any significant pain.  Abscess is fluctuant without surrounding erythema   CARDIO:  RRR, S1/S2 heard, no murmur  LUNGS:  CTA B, no wheezes, no crackles  ABDOMEN:    EXTREM:  warm, well perfused,  pulses are 2+ out of 4 in the dorsalis pedis and posterior tibialis.  He has good capillary refill.  He has dystrophic nails.  There is 1 small ulceration (3 mm in diameter, Wagner grade 1) on the plantar aspect of the right fifth metatarsal head.    GU:   SKIN:   NEUROMSK:     Assessment & Plan:  See problem associated charting

## 2013-08-03 NOTE — Patient Instructions (Addendum)
It was nice to see you today. Please increase your Lantus and NovoLog as we discussed. I started you on antibiotics and referred you to a dentist. Followup with your podiatrist, use the ointment on your foot daily.  Please return to see me in 2 months.

## 2013-08-03 NOTE — Assessment & Plan Note (Addendum)
Increase Lantus to 45 Units BID Liberalized NovoLog per medication list.

## 2013-08-03 NOTE — Assessment & Plan Note (Addendum)
Intolerance to ACE/ARB in past with symptomatic orthostasis.  And evidence of hypotension on home   check > Consider restarting very low dose ACEi or ARB if blood pressures are consistently higher at followup

## 2013-08-03 NOTE — Progress Notes (Signed)
Patient Identified Concern:  Dm control Stage of Change Patient Is In:  Preparation, has been making changes for less than 6 months. Patient Reported Barriers:  Lack of personal hygiene/self care. Self-destructive personal habits. Patient Reported Perceived Benefits:  Feeling better, less problems with health. Patient Reports Self-Efficacy:   Pt displays some self-efficacy however masks any lack of self-efficacy as a choice he is not willing to make. Behavior Change Supports:  No supports have been identified at this time. Goals:  Follow up with psychologist goals around DM management- take insulin daily, monitory BG/BP daily and keep record. Patient Education:  We talked about behaviors that improve his DM. Such as medications adherence and strategies around remembering to take novolog during the day.

## 2013-08-04 LAB — COMPREHENSIVE METABOLIC PANEL
ALT: 12 U/L (ref 0–53)
Albumin: 4.4 g/dL (ref 3.5–5.2)
CO2: 27 mEq/L (ref 19–32)
Calcium: 9.4 mg/dL (ref 8.4–10.5)
Chloride: 95 mEq/L — ABNORMAL LOW (ref 96–112)
Sodium: 133 mEq/L — ABNORMAL LOW (ref 135–145)
Total Protein: 7 g/dL (ref 6.0–8.3)

## 2013-08-04 LAB — LIPID PANEL: Cholesterol: 105 mg/dL (ref 0–200)

## 2013-08-05 NOTE — Assessment & Plan Note (Signed)
Wagner grade 1 ulceration.  Is encouraged to return to podiatry for further modification of his custom insoles.  He is to use Bactroban and reduce the pressure on his foot as often as possible.  Encourage daily wound checks

## 2013-08-05 NOTE — Assessment & Plan Note (Signed)
Start antibiotics.  Refer to dentistry given dental abscess.  Likely benefit from complete removal

## 2013-08-05 NOTE — Assessment & Plan Note (Signed)
Follow at [facility]

## 2013-08-07 ENCOUNTER — Telehealth: Payer: Self-pay | Admitting: Sports Medicine

## 2013-08-07 NOTE — Telephone Encounter (Signed)
Pt called to find out about his blood test and wanted a referral to [facility] clinic. JW

## 2013-08-12 NOTE — Telephone Encounter (Signed)
Labs are consistent with prior values. Needs to control diabetes to have improved control of cholesterol levels. Continue medications. Dentistry referral was placed at the appointment.

## 2013-08-27 ENCOUNTER — Telehealth: Payer: Self-pay | Admitting: Home Health Services

## 2013-08-27 NOTE — Telephone Encounter (Signed)
Spoke with Caleb Joseph  Pt reports taking insulin daily but is very inconsistent with novolog.  Pt reports the following blood glucose #s both fasting and post meal: 159, 30, 146, 220.  Pt set goal to limit carbs and to go shopping this weekend when he get's paid for more vegetables, fruits, and lean protein.  Pt's overall goal is dm management.

## 2013-08-28 NOTE — Telephone Encounter (Signed)
Agree and thanks for checking in. Food stability is making insulin titration difficult.  Will need to monitor for worsening hypoglycemia but pt remains profoundly uncontrolled

## 2013-09-03 ENCOUNTER — Other Ambulatory Visit: Payer: Self-pay | Admitting: Sports Medicine

## 2013-09-03 DIAGNOSIS — E1149 Type 2 diabetes mellitus with other diabetic neurological complication: Secondary | ICD-10-CM

## 2013-09-03 DIAGNOSIS — E1159 Type 2 diabetes mellitus with other circulatory complications: Secondary | ICD-10-CM

## 2013-09-03 MED ORDER — INSULIN ASPART 100 UNIT/ML ~~LOC~~ SOLN: 2.0000 [IU] | Freq: Three times a day (TID) | SUBCUTANEOUS | Status: DC

## 2013-09-03 MED ORDER — GABAPENTIN 600 MG PO TABS: 1200.0000 mg | ORAL_TABLET | Freq: Every day | ORAL | Status: DC

## 2013-09-10 ENCOUNTER — Telehealth: Payer: Self-pay | Admitting: Home Health Services

## 2013-09-10 NOTE — Telephone Encounter (Signed)
Spoke with Caleb Joseph.  Pt reports inconsistent medication adherence for both pills and insulin.   Blood Glucose readings: 541, 213, 66, 180 Blood Pressure readings: 95/63, 135/86  Pt reports feeling like he can't remember to take medications more and more.  He struggles with eating as well.  Pt reports that his [employer] reassigned him to a different PCP: [name] and he will have to start going there in December.  Pt's overall goal is dm management.

## 2013-09-11 NOTE — Telephone Encounter (Signed)
Appreciate the continued efforts to help Jarom.  I will be more than happy to continue providing care to Steffen until his transfer of care.  Pt has appointment with me in November

## 2013-09-14 ENCOUNTER — Other Ambulatory Visit: Payer: Self-pay | Admitting: Sports Medicine

## 2013-09-14 DIAGNOSIS — E785 Hyperlipidemia, unspecified: Secondary | ICD-10-CM

## 2013-09-14 MED ORDER — OMEGA-3-ACID ETHYL ESTERS 1 G PO CAPS: 2.0000 g | ORAL_CAPSULE | Freq: Two times a day (BID) | ORAL | Status: AC

## 2013-10-05 ENCOUNTER — Encounter: Payer: Self-pay | Admitting: Sports Medicine

## 2013-10-05 ENCOUNTER — Ambulatory Visit (INDEPENDENT_AMBULATORY_CARE_PROVIDER_SITE_OTHER): Payer: No Typology Code available for payment source | Admitting: Sports Medicine

## 2013-10-05 VITALS — BP 102/59 | HR 97 | Temp 98.3°F | Ht 75.0 in | Wt 241.7 lb

## 2013-10-05 DIAGNOSIS — I10 Essential (primary) hypertension: Secondary | ICD-10-CM

## 2013-10-05 DIAGNOSIS — F3289 Other specified depressive episodes: Secondary | ICD-10-CM

## 2013-10-05 DIAGNOSIS — Z91199 Patient's noncompliance with other medical treatment and regimen due to unspecified reason: Secondary | ICD-10-CM

## 2013-10-05 DIAGNOSIS — F329 Major depressive disorder, single episode, unspecified: Secondary | ICD-10-CM

## 2013-10-05 DIAGNOSIS — Z9119 Patient's noncompliance with other medical treatment and regimen: Secondary | ICD-10-CM

## 2013-10-05 DIAGNOSIS — L97909 Non-pressure chronic ulcer of unspecified part of unspecified lower leg with unspecified severity: Secondary | ICD-10-CM

## 2013-10-05 DIAGNOSIS — E1159 Type 2 diabetes mellitus with other circulatory complications: Secondary | ICD-10-CM

## 2013-10-05 NOTE — Patient Instructions (Signed)
   Take LANTUS TWICE A DAY  Work on the goals that have been set  Follow up in 1 month with myself or your new PCP  Continue ABX  GO TO YOUR DENTAL APPOINTMENT    If you need anything prior to your next visit please call the clinic. Please Bring all medications or accurate medication list with you to each appointment; an accurate medication list is essential in providing you the best care possible.

## 2013-10-05 NOTE — Progress Notes (Signed)
[facility] - 57 y.o. male MRN [id number]  Date of birth: [birth year]  CC, HPI, Interval History & ROS  Caleb Joseph is here today to followup on his chronic medical conditions including:  Diabetes,   He reports foot opened up last week, side of foot opened.  He has been seen by his podiatrist has been started on antibiotics.  He reports that he will likely be changing primary care physicians due to having new, [facility], and has been assigned a new doctor due to contract agreement.  Dentist appointment next Tuesday.  He has not been compliant with all of his insulin on a regular basis however reports 100% compliance with his each bedtime dosing of Lantus..  Reports one episode of a blood sugar to 66.  He has not been checking his sugars on a regular basis but does produce a log that shows usually 4-6 checks in a week.  Sugars tend to be running in the high 200s low 300s.  He has increased polyuria and polydipsia. Pt denies chest pain, dyspnea at rest or exertion, PND, lower extremity edema. Patient denies any facial asymmetry, unilateral weakness, or dysarthria. Pt denies any fevers, chills, or rigors.  Pertinent History & Care Coordination   The patients history is remarkable for: # Diabetes: Very poorly controlled; difficult obtaining compliance  Working with Arlys Daveion on Goals of treatment  Significant Neuropathy complications with multiple LE amputations, on Neurontin # CV Disease: HTN, HLD, Hx of Abnormal EKG, no documented CAD  Normal EF in 2010 with Moderate Pulm HTN  ABI WNL in 2013  On Crestor, unable to tolerate ACE/ARB at this time due to symptomatic orthostasis # Psych: History of depression and likely bipolar disorder, question PTSD  Followed by Vesta Mixer who prescribing his psychiatric medications # Skin: 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  Unable to be arranged due to financial constraints # Poor dentition: Diffuse dental caries  Has not seen a dentist years, unable to afford  Most Recent Risk Labs:  Recent Labs  11/21/12 1615  01/15/13 1518 04/22/13 1549 08/03/13 1410 08/03/13 1455  HGBA1C  --   < > 12.4 10.9 10.0  --   TRIG 184*  --   --   --   --  252*  CHOL 142  --   --   --   --  105  HDL 28*  --   --   --   --  31*  LDLCALC 77  --   --   --   --  24  < > = values in this interval not displayed.  Insurance Pharmacy  [facility] [place] [facility]     History  Smoking status  . Current Some Day Smoker  . Types: Cigars, Pipe  Smokeless tobacco  . Current User  . Types: Chew, Snuff    Comment: occasional chew and cigarette, pipe, cigar user   Health Maintenance Due  Topic  . Colonoscopy   . Influenza Vaccine     Recent Labs  11/21/12 1615  01/15/13 1518 04/22/13 1549 08/03/13 1410 08/03/13 1455  HGBA1C  --   < > 12.4 10.9 10.0  --   TRIG 184*  --   --   --   --  252*  CHOL 142  --   --   --   --  105  HDL 28*  --   --   --   --  31*  LDLCALC 77  --   --   --   --  24  < > = values in this interval not displayed.   Otherwise past Medical, Surgical, Social, and Family History Reviewed per EMR Medications and Allergies reviewed and all updated if necessary. Objective Findings  VITALS: HR: 97 bpm  BP: 102/59 mmHg  TEMP: 98.3 F (36.8 C) (Oral)  RESP:    HT: 6\' 3"  (190.5 cm)  WT: 241 lb 11.2 oz (109.634 kg)  BMI: 30.3   BP Readings from Last 3 Encounters:  10/05/13 102/59  08/03/13 132/75  07/01/13 126/68   Wt Readings from Last 3 Encounters:  10/05/13 241 lb 11.2 oz (109.634 kg)  08/03/13 247 lb 11.2 oz (112.356 kg)  07/01/13 249 lb (112.946 kg)     PHYSICAL EXAM: GENERAL:  adult Caucasian male. In no discomfort; no respiratory distress  PSYCH: alert and appropriately interactive.  Poor insight,  emotionally labile especially regarding discussion around his best friend who unexpectedly passed away last month.    HNEENT:  oral mucosa moist, no dental abscesses appreciated however extremely poor dentition diffusely.    CARDIO: RRR, S1/S2 heard, no murmur  LUNGS: CTA B, no wheezes, no crackles  ABDOMEN:   EXTREM:  Warm, well perfused.  Moves all 4 extremities spontaneously; no lateralization.  Wagner grade 2 ulceration on the medial aspect of the right foot.  Sensation to light touch is absent  Distal pulses not appreciated, capillary refill approximately 3 seconds.  Trace pretibial edema.  GU:   SKIN:     Assessment & Plan   Problems addressed today: General Plan & Pt Instructions:  1. DIABETES MELLITUS, TYPE II, ON INSULIN, UNCONTROLLED   2. DEPRESSION & PTSD, ?Bipolar   3. HYPERTENSION   4. CHRONIC RIGHT FOOT ULCER   5. Hx of noncompliance with medical treatment, presenting hazards to health       Take LANTUS TWICE A DAY  Work on the goals that have been set  Follow up in 1 month with myself or your new PCP  Continue ABX  GO TO YOUR DENTAL APPOINTMENT      For further discussion of A/P and for follow up issues see problem based charting.

## 2013-10-07 NOTE — Assessment & Plan Note (Addendum)
This is followed by podiatry, there was some excessive hyperkeratotic tissue surrounding the ulceration this was debrided by sharp debridement today.  He is to continue antibiotics and continue wound care per podiatry.

## 2013-10-07 NOTE — Assessment & Plan Note (Signed)
He is resistant to starting of aldosterone modulation once again, blood pressure is low.

## 2013-10-07 NOTE — Assessment & Plan Note (Signed)
Encouraged to adhere to his normal insulin schedule.  Do believe that he has glucose toxicity at this time which is making it even more difficult to control his overall medical condition.  He discussed with Arlys Jamahl today regarding goal setting and will schedule a followup appointment with her for in 1-2 weeks.  He could be a candidate for other antidiabetic medications once he is able to afford medicines other than insulin when his insurance starts.

## 2013-10-07 NOTE — Assessment & Plan Note (Signed)
Reports he continues to be managed by his psychiatry clinic who he reports having a good relationship with, however am concerned that this is likely the underlying issue regarding his diabetic compliance and goal adherence.

## 2013-10-07 NOTE — Assessment & Plan Note (Signed)
Patient will once again schedule goal setting appointment with health coach.

## 2013-11-06 ENCOUNTER — Ambulatory Visit (INDEPENDENT_AMBULATORY_CARE_PROVIDER_SITE_OTHER): Payer: No Typology Code available for payment source | Admitting: Sports Medicine

## 2013-11-06 ENCOUNTER — Encounter: Payer: Self-pay | Admitting: Sports Medicine

## 2013-11-06 VITALS — BP 137/78 | HR 97 | Temp 98.7°F | Ht 75.0 in | Wt 250.0 lb

## 2013-11-06 DIAGNOSIS — L97909 Non-pressure chronic ulcer of unspecified part of unspecified lower leg with unspecified severity: Secondary | ICD-10-CM

## 2013-11-06 DIAGNOSIS — F329 Major depressive disorder, single episode, unspecified: Secondary | ICD-10-CM

## 2013-11-06 DIAGNOSIS — Z9119 Patient's noncompliance with other medical treatment and regimen: Secondary | ICD-10-CM

## 2013-11-06 DIAGNOSIS — E1149 Type 2 diabetes mellitus with other diabetic neurological complication: Secondary | ICD-10-CM

## 2013-11-06 DIAGNOSIS — K029 Dental caries, unspecified: Secondary | ICD-10-CM

## 2013-11-06 DIAGNOSIS — I10 Essential (primary) hypertension: Secondary | ICD-10-CM

## 2013-11-06 DIAGNOSIS — E1159 Type 2 diabetes mellitus with other circulatory complications: Secondary | ICD-10-CM

## 2013-11-06 NOTE — Assessment & Plan Note (Signed)
Goals of checking sugars and compliance to insulin regimens are paramount however have been a constant struggle that he has been addressing with health education coach, myself, and his psychiatrist.  He has had a minimal improvement since he has been here with his initial A1c of around 14.  He is consistently been around 10 for the past year and still does not make the connection that his lack of compliance will ultimately likely lead to further amputations and dialysis.  His goal is to stay off of dialysis however I'm concerned that with where his recent kidney function tests were he will be heading that direction in the coming years.

## 2013-11-06 NOTE — Assessment & Plan Note (Signed)
Moderately elevated today. > Consider  ARB therapy given intolerance to ACE therapy earlier this year, multiple office visits with blood pressures less than 100 systolic on lisinopril 2.5 and profound orthostatic symptoms

## 2013-11-06 NOTE — Assessment & Plan Note (Signed)
1200 mg of Neurontin each bedtime.  Patient has been on this regimen for quite some time and is resistant to any change.  Continue at this time

## 2013-11-06 NOTE — Assessment & Plan Note (Addendum)
Plan for tooth extraction next week, no further indication for antibiotics

## 2013-11-06 NOTE — Assessment & Plan Note (Addendum)
No changes, on depakote.   Chemistry      Component Value Date/Time   NA 133* 08/03/2013 1455   K 5.3 08/03/2013 1455   CL 95* 08/03/2013 1455   CO2 27 08/03/2013 1455   BUN 23 08/03/2013 1455   CREATININE 1.25 08/03/2013 1455   CREATININE 1.27 03/20/2012 0852      Component Value Date/Time   CALCIUM 9.4 08/03/2013 1455   ALKPHOS 52 08/03/2013 1455   AST 14 08/03/2013 1455   ALT 12 08/03/2013 1455   BILITOT 0.6 08/03/2013 1455

## 2013-11-06 NOTE — Assessment & Plan Note (Signed)
Improved, recently debrided by podiatrist.  No acute infection Continue mupirocin with daily dressing changes.  Patient has prescription for diabetic shoes

## 2013-11-06 NOTE — Patient Instructions (Signed)
   Follow up with your [facility]  I will send your records to your new physician's office.  Keep working on our goal of using your Lantus  Twice per day and using her NovoLog before any meal.    Please let us know if you need any refills on your medications prior to seeing your new physician  You are due for a colonoscopy and your new physician should be able to arrange this for you once your insurance is active.     If you need anything prior to your next visit please call the clinic. Please Bring all medications or accurate medication list with you to each appointment; an accurate medication list is essential in providing you the best care possible.

## 2013-11-06 NOTE — Progress Notes (Signed)
[facility] Caleb Joseph - 57 y.o. male MRN [id number]  Date of birth: 07-Nov-1956  CC, HPI, Interval History & ROS  Caleb Joseph is here today to followup on his chronic medical conditions including:  Diabetes, HLD, Mood disorder, HTN with profound orthostasis, chronic foot wound and poor dentention He reports foot opened up last week and he has seen his podiatrist this week who has debrided the right ulceration.  Podiatrist does not think this is infected.  Denies fever, erythema or exudate. Pt denies chest pain, dyspnea at rest or exertion, PND, lower extremity edema. Patient denies any facial asymmetry, unilateral weakness, or dysarthria. Pt denies hypoglycemic symptoms/episodes.  No reported new  polyuria/polydipsia. Pt is compliant with foot exams; persistent significant paresthesias.  Reports he has been compliant with with his Lantus 7-8/14 doses in the last 7 days.  He has checked his sugar one to 2 times per day that typically are greater than 200 with occasionally as low as 160 or as high as 500. Blood pressure checks at home have ranged between low 100s over 60s to 130s over 90s He is scheduled to have multiple teeth extracted next week He has an appointment with Aggie Cosier family nurse practitioner at Dr. Fortunato Curling office on December 30.  He believes he will be seeing them for his ongoing primary care given his insurance is taking effect.  Pertinent History & Care Coordination   Casey's major active medical problems include: # Diabetes: Very poorly controlled; difficult obtaining compliance  Working with Arlys Jibri on Goals of treatment  Significant Neuropathy complications with multiple LE amputations, on Neurontin # CV Disease: HTN, HLD, Hx of Abnormal EKG, no documented CAD  Normal EF in 2010 with Moderate Pulm HTN  ABI WNL in 2013  On Crestor, unable to tolerate ACE/ARB due to profound hypotension on lisinopril 2.5 mg daily; last microalbumin elevated  #  Psych: History of depression and likely bipolar disorder, question PTSD  Followed by Vesta Mixer who prescribing his psychiatric medications # Skin: 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  Unable to be arranged due to financial constraints # Poor dentition: Diffuse dental caries   scheduled with dentist to have multiple teeth extracted in December of 2014   Follow up Issues:  Compliance - patient psychiatric diagnoses which seem to significantly interfer with his compliance to his diabetic regimen.  Prior oral hypoglycemic medications have been unavailable due to lack of insurance.      History  Smoking status  . Current Some Day Smoker  . Types: Cigars, Pipe  Smokeless tobacco  . Current User  . Types: Chew, Snuff    Comment: occasional chew and cigarette, pipe, cigar user   Health Maintenance Due  Topic  . Colonoscopy   . Influenza Vaccine     Recent Labs  11/21/12 1615  04/22/13 1549 08/03/13 1410 08/03/13 1455 11/06/13 1446  HGBA1C  --   < > 10.9 10.0  --  10.3  TRIG 184*  --   --   --  252*  --   CHOL 142  --   --   --  105  --   HDL 28*  --   --   --  31*  --   LDLCALC 77  --   --   --  24  --   < > =  values in this interval not displayed.   Otherwise past Medical, Surgical, Social, and Family History Reviewed per EMR Medications and Allergies reviewed and all updated if necessary. Objective Findings  VITALS: HR: 97 bpm  BP: 137/78 mmHg  TEMP: 98.7 F (37.1 C) (Oral)  RESP:    HT: 6\' 3"  (190.5 cm)  WT: 250 lb (113.399 kg)  BMI: 31.3   BP Readings from Last 3 Encounters:  11/06/13 137/78  10/05/13 102/59  08/03/13 132/75   Wt Readings from Last 3 Encounters:  11/06/13 250 lb (113.399 kg)  10/05/13 241 lb 11.2 oz (109.634 kg)  08/03/13 247 lb 11.2 oz (112.356 kg)     PHYSICAL EXAM: GENERAL:  adult Caucasian  male. In no discomfort; no respiratory distress  PSYCH: alert and appropriately interactive.  Poor insight; pleasant  HNEENT:  oral mucosa moist, no dental abscesses appreciated however extremely poor dentition diffusely.    CARDIO: RRR, S1/S2 heard, no murmur  LUNGS: CTA B, no wheezes, no crackles  ABDOMEN:   EXTREM:  Warm, well perfused.  Moves all 4 extremities spontaneously; no lateralization.  Wagner grade 1 ulceration on the lateral aspect of the left foot over the base of the fifth metatarsal.  There is a first and fifth resection on the left with hammering of the second toe.  There are no other ulcerations on the left.  Right foot has first and third amputations with hammering of the second.  No open ulcerations.  Sensation is previously been tested and is significantly altered.  Distal pulses not appreciated, capillary refill approximately 3 seconds.  Trace pretibial edema.  GU:   SKIN:     Assessment & Plan   Problems addressed today: General Plan & Pt Instructions:  1. DIABETES MELLITUS, TYPE II, ON INSULIN, UNCONTROLLED   2. DIABETIC PERIPHERAL NEUROPATHY   3. DEPRESSION & PTSD, ?Bipolar   4. HYPERTENSION   5. DENTAL CARIES   6. CHRONIC RIGHT FOOT ULCER   7. Hx of noncompliance with medical treatment, presenting hazards to health       Follow up with your [facility]  I will send your records to your new physician's office.  Keep working on our goal of using your Lantus  Twice per day and using your NovoLog before any meal.    Please let us know if you need any refills on your medications prior to seeing your new physician  You are due for a colonoscopy and your new physician should be able to arrange this for you once your insurance is active.      For further discussion of A/P and for follow up issues see problem based charting.

## 2013-11-06 NOTE — Assessment & Plan Note (Signed)
Compliance with medication regimen continues to be significant barrier that Caleb Joseph acknowledges.  Multiple strategies have been attempted to help with compliance including frequent counseling/telephone contact from health coach.

## 2015-02-02 ENCOUNTER — Encounter: Payer: Self-pay | Admitting: *Deleted

## 2015-02-02 ENCOUNTER — Encounter: Payer: Medicare HMO | Attending: Endocrinology | Admitting: *Deleted

## 2015-02-02 VITALS — Ht 75.0 in | Wt 248.0 lb

## 2015-02-02 DIAGNOSIS — E118 Type 2 diabetes mellitus with unspecified complications: Secondary | ICD-10-CM | POA: Insufficient documentation

## 2015-02-02 DIAGNOSIS — Z794 Long term (current) use of insulin: Secondary | ICD-10-CM | POA: Diagnosis not present

## 2015-02-02 DIAGNOSIS — Z713 Dietary counseling and surveillance: Secondary | ICD-10-CM | POA: Diagnosis not present

## 2015-02-02 DIAGNOSIS — E1165 Type 2 diabetes mellitus with hyperglycemia: Secondary | ICD-10-CM

## 2015-02-02 DIAGNOSIS — IMO0002 Reserved for concepts with insufficient information to code with codable children: Secondary | ICD-10-CM

## 2015-02-02 NOTE — Progress Notes (Signed)
Medical Nutrition Therapy:  Appt start time: 1400 end time:  6283.  Assessment:  Primary concerns today: 02/01/14. He states history of DM 2 for over 15 years. He has been on insulin since 2004. He lives alone, He states he SMBG once or twice a day but it varies due to his short term memory loss. He is on Meal time dose of Novolog of 10 units plus Sliding Scale of 1 unit per 50 points above 150 mg/dl but he expressed confusion that he should take the meal time insulin if he didn't check his BG first, so he omits that insulin often. He brought his log sheets today which we will review.  His states his refrigerator is not working very well, the freezer is OK. He eats a lot of boxed meals especially for supper. He gets Sub Sandwiches or fast food routinely. He does not have a stove, he uses microwave and toaster oven. He uses a small apartment size refrigerator. He states he has episodes of memory loss. He commented a couple of times that he was trained to kill and that when he was to go through training to "erase" that training that it "didn't stick". He stated that he was not suicidal but homicidal.   Preferred Learning Style: No preference indicated   Learning Readiness:   Not ready  MEDICATIONS: see list   DIETARY INTAKE:  24-hr recall:  B ( AM): varies, whatever he can find in his house.  He only eats if he is really hungry, black coffee to drink all day Snk ( AM): no  L ( PM): skips if he ate breakfast OR maybe a 6" subway sandwich Snk ( PM): no D ( PM): boxed frozen dinner OR the other half of subway sandwich Snk ( PM): occasionally if BG is low after supper, PNB and crackers or fig newtons Beverages: coffee, water, sweet tea, beer on occasion.   Usual physical activity: none due to instability and risk of low BG. He does work in the yard in the summer time.  Estimated energy needs: 1800 calories 200 g carbohydrates 135 g protein 50 g fat  Progress Towards Goal(s):  Modified  goal(s).   Nutritional Diagnosis:  NB-1.1 Food and nutrition-related knowledge deficit As related to Diabetes management.  As evidenced by A1c of 15.6%.    Intervention:  Nutrition counseling and diabetes education initiated. Started with review of insulin action and explanation that his MD has given him guidance to take 10 units for a meal and Sliding Scale for any correction needed above and beyond the meal. Discussed basic Carb Counting by food group as method of portion control, reading food labels, and benefits of increased activity. Also discussed basic physiology of Diabetes, target BG ranges pre and post meals, and A1c.   Plan:  Aim for 3 Carb Choices per meal (45 grams) +/- 1 either way  Include protein in moderation with your meals and snacks Consider reading food labels for Total Carbohydrate of foods Consider checking BG before each meal as directed by MD  Consider taking medication Novolog insulin before each meal and correction dose (sliding scale for high BG) as directed by MD  Teaching Method Utilized: Visual and Auditory  Handouts given during visit include: Living Well with Diabetes Carb Counting and Food Label handouts Meal Plan Card Insulin Action handout  Barriers to learning/adherence to lifestyle change: history of mental illness  Demonstrated degree of understanding via:  Teach Back   Monitoring/Evaluation:  Dietary intake, exercise,  taking meal time insulin more correctly, and body weight in 6 week(s).

## 2015-02-02 NOTE — Patient Instructions (Signed)
Plan:  Aim for 3 Carb Choices per meal (45 grams) +/- 1 either way  Include protein in moderation with your meals and snacks Consider reading food labels for Total Carbohydrate of foods Consider checking BG before each meal as directed by MD  Consider taking medication Novolog insulin before each meal and correction dose (sliding scale for high BG) as directed by MD

## 2015-03-16 ENCOUNTER — Encounter: Payer: Medicare HMO | Attending: Endocrinology | Admitting: *Deleted

## 2015-03-16 VITALS — Ht 75.0 in | Wt 249.0 lb

## 2015-03-16 DIAGNOSIS — Z713 Dietary counseling and surveillance: Secondary | ICD-10-CM | POA: Diagnosis not present

## 2015-03-16 DIAGNOSIS — Z794 Long term (current) use of insulin: Secondary | ICD-10-CM | POA: Diagnosis not present

## 2015-03-16 DIAGNOSIS — E118 Type 2 diabetes mellitus with unspecified complications: Secondary | ICD-10-CM

## 2015-03-16 NOTE — Patient Instructions (Addendum)
Plan:  Consider checking BG before each meal as directed by MD  Consider taking medication Novolog insulin before each meal and correction dose (sliding scale for high BG) as directed by MD

## 2015-03-16 NOTE — Progress Notes (Signed)
  Medical Nutrition Therapy:  Appt start time: 1400 end time:  1500.  Assessment:  Primary concerns today: 03/16/15. His eating habits are irratic. He does eat breakfast and lunch by 2 PM. He drives his neighbor to work and she feeds him leftovers for supper, usually about 9 PM. He is taking his Toujeo more consistently by 10 PM and he takes Novolog for meal time when he remembers. He states he is having fewer low BG episodes since he went on Toujeo. He states at night his BG can be between 200-400 mg/dl or higher. He continues to jump from one subject to another and defers answers to questions often.  Preferred Learning Style: No preference indicated   Learning Readiness:   Not ready  MEDICATIONS: see list   DIETARY INTAKE:  24-hr recall:  B ( AM): varies, whatever he can find in his house.  He only eats if he is really hungry, black coffee to drink all day Snk ( AM): no  L ( PM): skips if he ate breakfast OR maybe a 6" subway sandwich Snk ( PM): no D ( PM): boxed frozen dinner OR the other half of subway sandwich Snk ( PM): occasionally if BG is low after supper, PNB and crackers or fig newtons Beverages: coffee, water, sweet tea, beer on occasion.   Usual physical activity: none due to instability and risk of low BG. He states he does work in the yard in the summer time.  Estimated energy needs: 1800 calories 200 g carbohydrates 135 g protein 50 g fat  Progress Towards Goal(s):  Modified goal(s).   Nutritional Diagnosis:  NB-1.1 Food and nutrition-related knowledge deficit As related to Diabetes management.  As evidenced by A1c of 15.6%. He states his last A1c dropped to 12%    Intervention:  Nutrition counseling and diabetes education continued. Reviewed insulin action again and explaned that his MD has given him guidance to take 10 units for a meal and Sliding Scale for any correction needed above and beyond the meal. Explained that if he checked his BG 3 times a day even when  not eating he could give the correction dose based on his Sliding Scale to get his BG lower for that time period.    Plan:  Consider checking BG before each meal as directed by MD  Consider taking medication Novolog insulin before each meal and correction dose (sliding scale for high BG) as directed by MD  Teaching Method Utilized: Visual and Auditory  Handouts given during visit include: Insulin Action handout  Barriers to learning/adherence to lifestyle change: history of mental illness  Demonstrated degree of understanding via:  Teach Back   Monitoring/Evaluation:  Dietary intake, exercise, taking meal time insulin more correctly, and body weight in 6 week(s).

## 2015-04-20 ENCOUNTER — Ambulatory Visit: Payer: Self-pay | Admitting: *Deleted

## 2015-05-24 ENCOUNTER — Ambulatory Visit: Payer: Medicare HMO | Admitting: Podiatry

## 2015-06-03 ENCOUNTER — Ambulatory Visit (INDEPENDENT_AMBULATORY_CARE_PROVIDER_SITE_OTHER): Payer: Medicare HMO | Admitting: Podiatry

## 2015-06-03 DIAGNOSIS — L97421 Non-pressure chronic ulcer of left heel and midfoot limited to breakdown of skin: Secondary | ICD-10-CM

## 2015-06-03 DIAGNOSIS — E0849 Diabetes mellitus due to underlying condition with other diabetic neurological complication: Secondary | ICD-10-CM

## 2015-06-03 DIAGNOSIS — E1151 Type 2 diabetes mellitus with diabetic peripheral angiopathy without gangrene: Secondary | ICD-10-CM

## 2015-06-03 DIAGNOSIS — E11621 Type 2 diabetes mellitus with foot ulcer: Secondary | ICD-10-CM

## 2015-06-03 DIAGNOSIS — I739 Peripheral vascular disease, unspecified: Secondary | ICD-10-CM

## 2015-06-03 DIAGNOSIS — L97529 Non-pressure chronic ulcer of other part of left foot with unspecified severity: Principal | ICD-10-CM

## 2015-06-05 NOTE — Progress Notes (Signed)
Subjective:     Patient ID: Caleb Joseph, male   DOB: 1956/07/24, 59 y.o.   MRN: [id number]  HPIThis patient presents to my office for continued evaluation of chronic noninfected ulcer left forefoot.  He has lived with ulcers on both feet due to his chronic neuropathy and angiopathy.  His right ulcer has healed and callus is present on outside bottom of right foot.  There is ulcer sub2,3  Which patient has been soaking and bandaging and wearing cam walker.  He presents today for debridement of ulcer.   Review of Systems     Objective:   Physical Exam HPI    Podiatric Exam: Vascular: dorsalis pedis and posterior tibial pulses are negative. Capillary return is immediate. Temperature gradient is negative. Skin turgor WNL, bilateral swelling  Sensorium: Absent Semmes Weinstein monofilament test.  Nail Exam: Pt has thick disfigured discolored nails with subungual debris noted bilateral entire nail hallux through fifth toenails Ulcer Exam: There There is one solitary diabetic ulcer sub 2,3 left foot.  Base is pink and clean with no drainage.  Measures 0.17 x 0.11. No clinical signs of infection.  No malodor noted. Orthopedic Exam: Muscle tone and strength are WNL. No limitations in general ROM. No crepitus or effusions noted. Foot type and digits show no abnormalities. Bony prominences are unremarkable. Skin: No Porokeratosis.  Callus right foot at previous ulcer site.  Diagnosis:  Diabetic Ulcer left forefoot.   Diabetic neuropathy and angiopathy  Treatment & Plan Procedures and Treatment: Consent by patient was obtained for treatment procedures. The patient understood the discussion of treatment and procedures well. All questions were answered thoroughly reviewed. Debridement of mycotic and hypertrophic toenails, 1 through 5 bilateral and clearing of subungual debris. No ulceration, no infection noted.  Return Visit-Office Procedure: Patient instructed to return to the office for a follow  up visit 3 months for continued evaluation and treatment.      Assessment:     Diabetic ulcer with diabetic neuropathy and  angiopathy Plan:     Debridement of diabetic ulcer with # 15 Blade.  Neosporin/DSD

## 2015-07-15 ENCOUNTER — Encounter: Payer: Self-pay | Admitting: Podiatry

## 2015-07-15 ENCOUNTER — Other Ambulatory Visit: Payer: Self-pay

## 2015-07-15 ENCOUNTER — Ambulatory Visit (INDEPENDENT_AMBULATORY_CARE_PROVIDER_SITE_OTHER): Payer: Medicare HMO | Admitting: Podiatry

## 2015-07-15 VITALS — BP 121/72 | HR 83 | Resp 18

## 2015-07-15 DIAGNOSIS — E11621 Type 2 diabetes mellitus with foot ulcer: Secondary | ICD-10-CM

## 2015-07-15 DIAGNOSIS — L97529 Non-pressure chronic ulcer of other part of left foot with unspecified severity: Secondary | ICD-10-CM | POA: Diagnosis not present

## 2015-07-15 DIAGNOSIS — L03116 Cellulitis of left lower limb: Secondary | ICD-10-CM | POA: Diagnosis not present

## 2015-07-15 MED ORDER — DOXYCYCLINE HYCLATE 100 MG PO TABS: 100.0000 mg | ORAL_TABLET | Freq: Two times a day (BID) | ORAL | Status: DC

## 2015-07-16 NOTE — Progress Notes (Signed)
Subjective:     Patient ID: Caleb Joseph, male   DOB: 11-Jul-1956, 59 y.o.   MRN: [id number]  HPIThis patient presents to my office for continued evaluation of chronic noninfected ulcer left forefoot.  He has lived with ulcers on both feet due to his chronic neuropathy and angiopathy.  His right ulcer has healed and callus is present on outside bottom of right foot.  There is ulcer sub2,3 left foot which comunicates with newly formed blood blister  at the site of his amputated third toe left.   Patient has been soaking and bandaging and wearing cam walker.  He presents today for debridement of ulcer.   Review of Systems     Objective:   Physical Exam HPI    Podiatric Exam: Vascular: dorsalis pedis and posterior tibial pulses are negative. Capillary return is immediate. Temperature gradient is negative. Skin turgor WNL, bilateral swelling  Sensorium: Absent Semmes Weinstein monofilament test.  Nail Exam: Pt has thick disfigured discolored nails with subungual debris noted bilateral entire nail hallux through fifth toenails Ulcer Exam: There There is one solitary diabetic ulcer sub 2,3 left foot.  Base is pink and clean with necrotic tissue around plantar ulcer..  Measures 0.15 x 11 mm. No clinical signs of infection.  No malodor noted.There is blood blister which dorsum third metatarsal head left foot.  There is increased inflammation and swelling  over second and third metatarsal head left foot..  No drainage noted. Orthopedic Exam: Muscle tone and strength are WNL. No limitations in general ROM. No crepitus or effusions noted. Foot type and digits show no abnormalities. Bony prominences are unremarkable. Skin: No Porokeratosis.  Callus right foot at previous ulcer site.  Diagnosis:  Diabetic Ulcer left forefoot.   Diabetic neuropathy and angiopathy  Treatment & Plan Procedures and Treatment: Consent by patient was obtained for treatment procedures. The patient understood the discussion  of treatment and procedures well. All questions were answered thoroughly reviewed. Debridement of diabetic ulcer left foot with redressing applied.  Doxycycline was prescribed.   Return Visit-Office Procedure: Patient instructed to return to the office for a follow up visit  1 week for continued evaluation and treatment. Home instructions for soaks were given to patient.      Assessment:     Diabetic ulcer with diabetic neuropathy and  angiopathy  Cellulitis left foot Plan:     Debridement of diabetic ulcer with # 15 Blade.  Neosporin/DSD

## 2015-07-22 ENCOUNTER — Encounter: Payer: Self-pay | Admitting: Podiatry

## 2015-07-22 ENCOUNTER — Ambulatory Visit (INDEPENDENT_AMBULATORY_CARE_PROVIDER_SITE_OTHER): Payer: Medicare HMO | Admitting: Podiatry

## 2015-07-22 VITALS — BP 118/69 | HR 86 | Resp 18

## 2015-07-22 DIAGNOSIS — E0849 Diabetes mellitus due to underlying condition with other diabetic neurological complication: Secondary | ICD-10-CM | POA: Diagnosis not present

## 2015-07-22 DIAGNOSIS — E11621 Type 2 diabetes mellitus with foot ulcer: Secondary | ICD-10-CM

## 2015-07-22 DIAGNOSIS — L97529 Non-pressure chronic ulcer of other part of left foot with unspecified severity: Secondary | ICD-10-CM | POA: Diagnosis not present

## 2015-07-22 DIAGNOSIS — I739 Peripheral vascular disease, unspecified: Secondary | ICD-10-CM

## 2015-07-22 NOTE — Progress Notes (Signed)
Subjective:     Patient ID: Caleb Joseph, male   DOB: Feb 23, 1956, 59 y.o.   MRN: [id number]  HPI This patient returns to the office following a breakdown of his left forefoot.  His diabetic ulcer returned and became infected and was treated with doxycycline.  He says he has been walking with cam walker and has been bandaging the infected area left foot.  Review of Systems     Objective:   Physical Exam GENERAL APPEARANCE: Alert, conversant. Appropriately groomed. No acute distress.  VASCULAR: Pedal pulses not  palpable at  DP and PT bilateral.  Capillary refill time is immediate to all digits,   NEUROLOGIC: sensation is absent  to 5.07 monofilament at 5/5 sites bilateral.  Light touch is intact bilateral, Muscle strength normal.  MUSCULOSKELETAL: acceptable muscle strength, tone and stability bilateral.  Intrinsic muscluature intact bilateral.  Rectus appearance of foot and digits noted bilateral.   DERMATOLOGIC: skin color, texture, and turgor are within normal limits.  No preulcerative lesions or ulcers  are seen, no interdigital maceration noted.  No open lesions present.  Digital nails are asymptomatic. No drainage noted. Ulceration under left forefoot has closed and the infection has resolved.      Assessment:     Diabetic ulcer with resolved infection.  Diabetic neuropathy.     Plan:     ROV.  Told to continue antibiotics and wearing cam walker.   RTC 4 weeks.

## 2015-08-19 ENCOUNTER — Ambulatory Visit (INDEPENDENT_AMBULATORY_CARE_PROVIDER_SITE_OTHER): Payer: Medicare HMO | Admitting: Podiatry

## 2015-08-19 ENCOUNTER — Encounter: Payer: Self-pay | Admitting: Podiatry

## 2015-08-19 DIAGNOSIS — L97529 Non-pressure chronic ulcer of other part of left foot with unspecified severity: Secondary | ICD-10-CM | POA: Diagnosis not present

## 2015-08-19 DIAGNOSIS — I739 Peripheral vascular disease, unspecified: Secondary | ICD-10-CM

## 2015-08-19 DIAGNOSIS — E11621 Type 2 diabetes mellitus with foot ulcer: Secondary | ICD-10-CM | POA: Diagnosis not present

## 2015-08-19 MED ORDER — DOXYCYCLINE HYCLATE 100 MG PO TABS: 100.0000 mg | ORAL_TABLET | Freq: Two times a day (BID) | ORAL | Status: DC

## 2015-08-20 NOTE — Progress Notes (Signed)
Subjective:     Patient ID: Caleb Joseph, male   DOB: 1956/07/03, 59 y.o.   MRN: [id number]  HPIThis patient presents to the office with continued non healing ulcer on the top of his left foot.  He started with infected ulcer on bottom of left foot which healed and a localized ulcer has now formed on the top of left forefoot. H e is diabetic with markedly elevated Hb!AC  He has brown discoloration over top of left foot with open ulcer noted.He returns for continued evaluation and treatment.   Review of Systems     Objective:   Physical Exam Vascular: dorsalis pedis and posterior tibial pulses are negative. Capillary return is immediate. Temperature gradient is negative. Skin turgor WNL, bilateral swelling  Sensorium: Absent Semmes Weinstein monofilament test.  Nail Exam: Pt has thick disfigured discolored nails with subungual debris noted bilateral entire nail hallux through fifth toenails Ulcer Exam: . Marland KitchenThere is brown discoloration over second and third metatarsals left foot.  This brown discoloration surrounds  dorsal ulcer over third metatarsal  At the site of previous blood blister there is granulation  Tissue which is inflamed.  Clear exudate noted from the dorsal ulcer. No streaking noted on left foot. Orthopedic Exam: Muscle tone and strength are WNL. No limitations in general ROM. No crepitus or effusions noted. Foot type and digits show no abnormalities. Bony prominences are unremarkable. Skin: No Porokeratosis. Callus right foot at previous ulcer site.     Assessment:     Diabetic ulcer.     Plan:     Debride ulcer .  X-rays were taken to check on possible changes to the third metatarsal head left foot.  There is one cystic area but otherwise the metatarsal head is intact.  Prescribe doxycycline.  RTC 2 weeks.  Continue home soaks.

## 2015-08-22 ENCOUNTER — Telehealth: Payer: Self-pay | Admitting: *Deleted

## 2015-08-22 MED ORDER — DOXYCYCLINE HYCLATE 100 MG PO TABS: ORAL_TABLET | ORAL | Status: DC

## 2015-08-22 NOTE — Telephone Encounter (Signed)
Pt called in states the Doxycycline is too hard, which makes it not as effective, pt state he takes the blue capsules and they get rid of the infection.  [place] and explained the order that was to be sent [facility].

## 2015-09-01 ENCOUNTER — Encounter: Payer: Self-pay | Admitting: Podiatry

## 2015-09-01 ENCOUNTER — Ambulatory Visit (INDEPENDENT_AMBULATORY_CARE_PROVIDER_SITE_OTHER): Payer: Medicare HMO | Admitting: Podiatry

## 2015-09-01 VITALS — BP 128/71 | HR 87 | Temp 98.1°F | Resp 14

## 2015-09-01 DIAGNOSIS — L97529 Non-pressure chronic ulcer of other part of left foot with unspecified severity: Secondary | ICD-10-CM

## 2015-09-01 DIAGNOSIS — E11621 Type 2 diabetes mellitus with foot ulcer: Secondary | ICD-10-CM

## 2015-09-01 DIAGNOSIS — I739 Peripheral vascular disease, unspecified: Secondary | ICD-10-CM

## 2015-09-01 NOTE — Progress Notes (Signed)
Subjective:     Patient ID: Caleb Joseph, male   DOB: 1956/05/17, 59 y.o.   MRN: [id number]  HPIThis patient presents to the office with continued ulcerated area with ellarged soft tissue mass left foot.  This developed following infected plantar ulcer sub 3 left foot.  X-rays were taken revealing no pathology.  He says this area has been draining and pus has been noted running from the ulcerated area.  He presents to be evaluated following 2 weeks of antibiotics.  He is diabetic with history of multiple amputations both feet.   Review of Systems     Objective:   Physical Exam.    Physical Exam Vascular: dorsalis pedis and posterior tibial pulses are negative. Capillary return is immediate. Temperature gradient is negative. Skin turgor WNL, bilateral swelling  Sensorium: Absent Semmes Weinstein monofilament test.  Nail Exam: Pt has thick disfigured discolored nails with subungual debris noted bilateral entire nail hallux through fifth toenails Ulcer Exam: . Marland KitchenThere is brown discoloration over second and third metatarsals left foot. This brown discoloration surrounds dorsal ulcer over third metatarsal At the site of previous blood blister there is granulation Tissue which is inflamed. Clear exudate noted from the dorsal ulcer. No streaking noted on left foot. Orthopedic Exam: Muscle tone and strength are WNL. No limitations in general ROM. No crepitus or effusions noted. Foot type and digits show no abnormalities. Bony prominences are unremarkable. Skin: No Porokeratosis. Callus right foot at previous ulcer site            Assessment:     Ulcer dorsum left foot with probable granulation tissue left foot.                Plan:     ROV  Redressing noted left foot.  Took picture of foot and plan to show Dr. Jacqualyn Posey his foot and plan to allow him to be evaluated by Dr. Jacqualyn Posey.

## 2015-09-07 ENCOUNTER — Telehealth: Payer: Self-pay | Admitting: *Deleted

## 2015-09-07 DIAGNOSIS — E11621 Type 2 diabetes mellitus with foot ulcer: Secondary | ICD-10-CM

## 2015-09-07 DIAGNOSIS — L97529 Non-pressure chronic ulcer of other part of left foot with unspecified severity: Principal | ICD-10-CM

## 2015-09-07 MED ORDER — DOXYCYCLINE HYCLATE 100 MG PO TABS
ORAL_TABLET | ORAL | Status: DC
Start: 2015-09-07 — End: 2015-09-25

## 2015-09-07 NOTE — Telephone Encounter (Addendum)
Dr. Prudence Davidson ordered MRI of left foot without contrast 73718, dx E11.621, L97.529 prior to pt seeing Dr. Jacqualyn Posey.  Pt is informed and request refill of Doxycycline CAPSULES, because they work better.  Dr. Prudence Davidson ordered refill as previously, I called in to the [facility] [place] to [facility] to make certain the rx was filled as capsules.  Prior authorization began from [place] - "NOTE: This [facility] member does not require prior authorization for OUTPATIENT Radiology through [place] or [place] [facility] at this time."  Faxed to [facility].

## 2015-09-20 ENCOUNTER — Telehealth: Payer: Self-pay

## 2015-09-20 NOTE — Telephone Encounter (Signed)
Pt schedule for MRI on 09/28/15 at 4pm

## 2015-09-25 ENCOUNTER — Encounter (HOSPITAL_COMMUNITY): Payer: Self-pay | Admitting: *Deleted

## 2015-09-25 ENCOUNTER — Emergency Department (HOSPITAL_COMMUNITY): Payer: Medicare HMO

## 2015-09-25 ENCOUNTER — Encounter: Payer: Self-pay | Admitting: Internal Medicine

## 2015-09-25 ENCOUNTER — Inpatient Hospital Stay (HOSPITAL_COMMUNITY)
Admission: EM | Admit: 2015-09-25 | Discharge: 2015-09-29 | DRG: 638 | Disposition: A | Discharge status: Home / Self Care | Payer: Medicare HMO | Attending: Internal Medicine | Admitting: Internal Medicine

## 2015-09-25 DIAGNOSIS — E1122 Type 2 diabetes mellitus with diabetic chronic kidney disease: Secondary | ICD-10-CM | POA: Diagnosis present

## 2015-09-25 DIAGNOSIS — N182 Chronic kidney disease, stage 2 (mild): Secondary | ICD-10-CM | POA: Diagnosis present

## 2015-09-25 DIAGNOSIS — M86172 Other acute osteomyelitis, left ankle and foot: Secondary | ICD-10-CM

## 2015-09-25 DIAGNOSIS — Z6830 Body mass index (BMI) 30.0-30.9, adult: Secondary | ICD-10-CM | POA: Diagnosis not present

## 2015-09-25 DIAGNOSIS — Z794 Long term (current) use of insulin: Secondary | ICD-10-CM

## 2015-09-25 DIAGNOSIS — F319 Bipolar disorder, unspecified: Secondary | ICD-10-CM | POA: Diagnosis not present

## 2015-09-25 DIAGNOSIS — E1151 Type 2 diabetes mellitus with diabetic peripheral angiopathy without gangrene: Secondary | ICD-10-CM | POA: Diagnosis present

## 2015-09-25 DIAGNOSIS — I129 Hypertensive chronic kidney disease with stage 1 through stage 4 chronic kidney disease, or unspecified chronic kidney disease: Secondary | ICD-10-CM | POA: Diagnosis present

## 2015-09-25 DIAGNOSIS — M869 Osteomyelitis, unspecified: Secondary | ICD-10-CM | POA: Diagnosis present

## 2015-09-25 DIAGNOSIS — D509 Iron deficiency anemia, unspecified: Secondary | ICD-10-CM | POA: Diagnosis not present

## 2015-09-25 DIAGNOSIS — Z9119 Patient's noncompliance with other medical treatment and regimen: Secondary | ICD-10-CM

## 2015-09-25 DIAGNOSIS — Z7982 Long term (current) use of aspirin: Secondary | ICD-10-CM | POA: Diagnosis not present

## 2015-09-25 DIAGNOSIS — E1310 Other specified diabetes mellitus with ketoacidosis without coma: Principal | ICD-10-CM | POA: Diagnosis present

## 2015-09-25 DIAGNOSIS — E1010 Type 1 diabetes mellitus with ketoacidosis without coma: Secondary | ICD-10-CM

## 2015-09-25 DIAGNOSIS — E119 Type 2 diabetes mellitus without complications: Secondary | ICD-10-CM | POA: Diagnosis present

## 2015-09-25 DIAGNOSIS — L97509 Non-pressure chronic ulcer of other part of unspecified foot with unspecified severity: Secondary | ICD-10-CM | POA: Diagnosis not present

## 2015-09-25 DIAGNOSIS — Z23 Encounter for immunization: Secondary | ICD-10-CM | POA: Diagnosis not present

## 2015-09-25 DIAGNOSIS — E785 Hyperlipidemia, unspecified: Secondary | ICD-10-CM | POA: Diagnosis present

## 2015-09-25 DIAGNOSIS — E0811 Diabetes mellitus due to underlying condition with ketoacidosis with coma: Secondary | ICD-10-CM | POA: Diagnosis not present

## 2015-09-25 DIAGNOSIS — E860 Dehydration: Secondary | ICD-10-CM | POA: Diagnosis not present

## 2015-09-25 DIAGNOSIS — I739 Peripheral vascular disease, unspecified: Secondary | ICD-10-CM | POA: Diagnosis not present

## 2015-09-25 DIAGNOSIS — E871 Hypo-osmolality and hyponatremia: Secondary | ICD-10-CM | POA: Diagnosis present

## 2015-09-25 DIAGNOSIS — Z9114 Patient's other noncompliance with medication regimen: Secondary | ICD-10-CM | POA: Diagnosis not present

## 2015-09-25 DIAGNOSIS — E669 Obesity, unspecified: Secondary | ICD-10-CM | POA: Diagnosis not present

## 2015-09-25 DIAGNOSIS — E1110 Type 2 diabetes mellitus with ketoacidosis without coma: Secondary | ICD-10-CM | POA: Diagnosis present

## 2015-09-25 DIAGNOSIS — I10 Essential (primary) hypertension: Secondary | ICD-10-CM

## 2015-09-25 DIAGNOSIS — E1140 Type 2 diabetes mellitus with diabetic neuropathy, unspecified: Secondary | ICD-10-CM | POA: Diagnosis not present

## 2015-09-25 DIAGNOSIS — E872 Acidosis, unspecified: Secondary | ICD-10-CM | POA: Diagnosis present

## 2015-09-25 DIAGNOSIS — E1142 Type 2 diabetes mellitus with diabetic polyneuropathy: Secondary | ICD-10-CM

## 2015-09-25 DIAGNOSIS — E1169 Type 2 diabetes mellitus with other specified complication: Secondary | ICD-10-CM | POA: Diagnosis not present

## 2015-09-25 LAB — I-STAT VENOUS BLOOD GAS, ED
Acid-base deficit: 3 mmol/L — ABNORMAL HIGH (ref 0.0–2.0)
BICARBONATE: 23 meq/L (ref 20.0–24.0)
O2 Saturation: 73 %
PH VEN: 7.351 — AB (ref 7.250–7.300)
PO2 VEN: 41 mmHg (ref 30.0–45.0)
TCO2: 24 mmol/L (ref 0–100)
pCO2, Ven: 41.6 mmHg — ABNORMAL LOW (ref 45.0–50.0)

## 2015-09-25 LAB — CBC WITH DIFFERENTIAL/PLATELET
Basophils Absolute: 0 10*3/uL (ref 0.0–0.1)
Basophils Relative: 0 %
EOS ABS: 0.1 10*3/uL (ref 0.0–0.7)
EOS PCT: 1 %
HCT: 35.8 % — ABNORMAL LOW (ref 39.0–52.0)
Hemoglobin: 12.2 g/dL — ABNORMAL LOW (ref 13.0–17.0)
LYMPHS ABS: 2.7 10*3/uL (ref 0.7–4.0)
LYMPHS PCT: 27 %
MCH: 30 pg (ref 26.0–34.0)
MCHC: 34.1 g/dL (ref 30.0–36.0)
MCV: 88 fL (ref 78.0–100.0)
MONO ABS: 0.5 10*3/uL (ref 0.1–1.0)
MONOS PCT: 5 %
Neutro Abs: 6.6 10*3/uL (ref 1.7–7.7)
Neutrophils Relative %: 67 %
PLATELETS: 152 10*3/uL (ref 150–400)
RBC: 4.07 MIL/uL — ABNORMAL LOW (ref 4.22–5.81)
RDW: 12.2 % (ref 11.5–15.5)
WBC: 9.8 10*3/uL (ref 4.0–10.5)

## 2015-09-25 LAB — BASIC METABOLIC PANEL
ANION GAP: 16 — AB (ref 5–15)
ANION GAP: 18 — AB (ref 5–15)
BUN: 17 mg/dL (ref 6–20)
BUN: 18 mg/dL (ref 6–20)
CALCIUM: 9.7 mg/dL (ref 8.9–10.3)
CHLORIDE: 87 mmol/L — AB (ref 101–111)
CHLORIDE: 90 mmol/L — AB (ref 101–111)
CO2: 21 mmol/L — ABNORMAL LOW (ref 22–32)
CO2: 22 mmol/L (ref 22–32)
CREATININE: 1.28 mg/dL — AB (ref 0.61–1.24)
Calcium: 9.5 mg/dL (ref 8.9–10.3)
Creatinine, Ser: 1.33 mg/dL — ABNORMAL HIGH (ref 0.61–1.24)
GFR calc Af Amer: >60 mL/min (ref >60)
GFR calc non Af Amer: 57 mL/min — ABNORMAL LOW (ref >60)
GFR calc non Af Amer: 60 mL/min — ABNORMAL LOW (ref >60)
GLUCOSE: 658 mg/dL — AB (ref 65–99)
Glucose, Bld: 366 mg/dL — ABNORMAL HIGH (ref 65–99)
POTASSIUM: 4.7 mmol/L (ref 3.5–5.1)
Potassium: 4.6 mmol/L (ref 3.5–5.1)
SODIUM: 130 mmol/L — AB (ref 135–145)
Sodium: 124 mmol/L — ABNORMAL LOW (ref 135–145)

## 2015-09-25 LAB — URINALYSIS, ROUTINE W REFLEX MICROSCOPIC
Bilirubin Urine: NEGATIVE
HGB URINE DIPSTICK: NEGATIVE
KETONES UR: 15 mg/dL — AB
LEUKOCYTES UA: NEGATIVE
Nitrite: NEGATIVE
PROTEIN: NEGATIVE mg/dL
Specific Gravity, Urine: 1.039 — ABNORMAL HIGH (ref 1.005–1.030)
UROBILINOGEN UA: 0.2 mg/dL (ref 0.0–1.0)
pH: 5 (ref 5.0–8.0)

## 2015-09-25 LAB — CBC
HEMATOCRIT: 38.8 % — AB (ref 39.0–52.0)
HEMOGLOBIN: 13.2 g/dL (ref 13.0–17.0)
MCH: 30.2 pg (ref 26.0–34.0)
MCHC: 34 g/dL (ref 30.0–36.0)
MCV: 88.8 fL (ref 78.0–100.0)
Platelets: 156 10*3/uL (ref 150–400)
RBC: 4.37 MIL/uL (ref 4.22–5.81)
RDW: 12.3 % (ref 11.5–15.5)
WBC: 11.6 10*3/uL — AB (ref 4.0–10.5)

## 2015-09-25 LAB — I-STAT CG4 LACTIC ACID, ED: Lactic Acid, Venous: 4.64 mmol/L (ref 0.5–2.0)

## 2015-09-25 LAB — CBG MONITORING, ED
GLUCOSE-CAPILLARY: 408 mg/dL — AB (ref 65–99)
GLUCOSE-CAPILLARY: 369 mg/dL — AB (ref 65–99)
Glucose-Capillary: >600 mg/dL (ref 65–99)
Glucose-Capillary: 560 mg/dL (ref 65–99)

## 2015-09-25 LAB — C-REACTIVE PROTEIN: CRP: 0.7 mg/dL (ref <1.0)

## 2015-09-25 LAB — VALPROIC ACID LEVEL: VALPROIC ACID LVL: 62 ug/mL (ref 50.0–100.0)

## 2015-09-25 LAB — URINE MICROSCOPIC-ADD ON

## 2015-09-25 LAB — PREALBUMIN: Prealbumin: 24.3 mg/dL (ref 18–38)

## 2015-09-25 LAB — BETA-HYDROXYBUTYRIC ACID: BETA-HYDROXYBUTYRIC ACID: 0.13 mmol/L (ref 0.05–0.27)

## 2015-09-25 LAB — LACTIC ACID, PLASMA: LACTIC ACID, VENOUS: 3.5 mmol/L — AB (ref 0.5–2.0)

## 2015-09-25 LAB — CK: Total CK: 95 U/L (ref 49–397)

## 2015-09-25 LAB — GLUCOSE, CAPILLARY: Glucose-Capillary: 292 mg/dL — ABNORMAL HIGH (ref 65–99)

## 2015-09-25 MED ORDER — GABAPENTIN 600 MG PO TABS
600.0000 mg | ORAL_TABLET | Freq: Three times a day (TID) | ORAL | Status: DC
  Administered 2015-09-25 – 2015-09-29 (×10): 600 mg via ORAL
  Filled 2015-09-25 (×11): qty 1

## 2015-09-25 MED ORDER — FAMOTIDINE IN NACL 20-0.9 MG/50ML-% IV SOLN
20.0000 mg | Freq: Two times a day (BID) | INTRAVENOUS | Status: DC
  Administered 2015-09-25 – 2015-09-27 (×4): 20 mg via INTRAVENOUS
  Filled 2015-09-25 (×5): qty 50

## 2015-09-25 MED ORDER — SODIUM CHLORIDE 0.9 % IV SOLN
2500.0000 mg | Freq: Once | INTRAVENOUS | Status: AC
  Administered 2015-09-25: 2500 mg via INTRAVENOUS
  Filled 2015-09-25: qty 2500

## 2015-09-25 MED ORDER — ROSUVASTATIN CALCIUM 5 MG PO TABS
5.0000 mg | ORAL_TABLET | Freq: Every day | ORAL | Status: DC
  Administered 2015-09-25 – 2015-09-29 (×5): 5 mg via ORAL
  Filled 2015-09-25 (×5): qty 1

## 2015-09-25 MED ORDER — DEXTROSE-NACL 5-0.45 % IV SOLN
INTRAVENOUS | Status: DC
  Administered 2015-09-25: via INTRAVENOUS

## 2015-09-25 MED ORDER — POTASSIUM CHLORIDE 10 MEQ/100ML IV SOLN
10.0000 meq | INTRAVENOUS | Status: AC
Start: 2015-09-25 — End: 2015-09-26
  Administered 2015-09-25 (×2): 10 meq via INTRAVENOUS
  Filled 2015-09-25 (×2): qty 100

## 2015-09-25 MED ORDER — METRONIDAZOLE IN NACL 5-0.79 MG/ML-% IV SOLN
500.0000 mg | Freq: Three times a day (TID) | INTRAVENOUS | Status: DC
  Administered 2015-09-25 – 2015-09-29 (×11): 500 mg via INTRAVENOUS
  Filled 2015-09-25 (×12): qty 100

## 2015-09-25 MED ORDER — HEPARIN SODIUM (PORCINE) 5000 UNIT/ML IJ SOLN
5000.0000 [IU] | Freq: Three times a day (TID) | INTRAMUSCULAR | Status: DC
  Administered 2015-09-25 – 2015-09-29 (×11): 5000 [IU] via SUBCUTANEOUS
  Filled 2015-09-25 (×11): qty 1

## 2015-09-25 MED ORDER — SODIUM CHLORIDE 0.9 % IV SOLN
INTRAVENOUS | Status: DC
  Administered 2015-09-25: 3.5 [IU]/h via INTRAVENOUS
  Filled 2015-09-25: qty 2.5

## 2015-09-25 MED ORDER — CEFTAZIDIME 2 G IJ SOLR
2.0000 g | Freq: Three times a day (TID) | INTRAMUSCULAR | Status: DC
  Administered 2015-09-26 – 2015-09-29 (×11): 2 g via INTRAVENOUS
  Filled 2015-09-25 (×13): qty 2

## 2015-09-25 MED ORDER — FERROUS SULFATE 325 (65 FE) MG PO TABS
325.0000 mg | ORAL_TABLET | Freq: Every day | ORAL | Status: DC
  Administered 2015-09-26 – 2015-09-29 (×4): 325 mg via ORAL
  Filled 2015-09-25 (×4): qty 1

## 2015-09-25 MED ORDER — DIVALPROEX SODIUM 500 MG PO DR TAB
1500.0000 mg | DELAYED_RELEASE_TABLET | Freq: Every day | ORAL | Status: DC
  Administered 2015-09-25 – 2015-09-29 (×4): 1500 mg via ORAL
  Filled 2015-09-25 (×5): qty 3

## 2015-09-25 MED ORDER — ASPIRIN EC 81 MG PO TBEC
81.0000 mg | DELAYED_RELEASE_TABLET | Freq: Every day | ORAL | Status: DC
  Administered 2015-09-25 – 2015-09-29 (×5): 81 mg via ORAL
  Filled 2015-09-25 (×5): qty 1

## 2015-09-25 MED ORDER — SODIUM CHLORIDE 0.9 % IV BOLUS (SEPSIS)
1000.0000 mL | Freq: Once | INTRAVENOUS | Status: AC
  Administered 2015-09-25: 1000 mL via INTRAVENOUS

## 2015-09-25 MED ORDER — SODIUM CHLORIDE 0.9 % IV SOLN: INTRAVENOUS | Status: DC

## 2015-09-25 MED ORDER — DULOXETINE HCL 30 MG PO CPEP
30.0000 mg | ORAL_CAPSULE | Freq: Every day | ORAL | Status: DC
  Administered 2015-09-25 – 2015-09-29 (×4): 30 mg via ORAL
  Filled 2015-09-25 (×4): qty 1

## 2015-09-25 MED ORDER — INSULIN ASPART 100 UNIT/ML ~~LOC~~ SOLN
10.0000 [IU] | Freq: Once | SUBCUTANEOUS | Status: AC
  Administered 2015-09-25: 10 [IU] via INTRAVENOUS
  Filled 2015-09-25: qty 1

## 2015-09-25 MED ORDER — ALUM & MAG HYDROXIDE-SIMETH 200-200-20 MG/5ML PO SUSP: 15.0000 mL | Freq: Four times a day (QID) | ORAL | Status: DC | PRN

## 2015-09-25 MED ORDER — VANCOMYCIN HCL IN DEXTROSE 750-5 MG/150ML-% IV SOLN
750.0000 mg | Freq: Two times a day (BID) | INTRAVENOUS | Status: DC
  Administered 2015-09-26 – 2015-09-28 (×5): 750 mg via INTRAVENOUS
  Filled 2015-09-25 (×7): qty 150

## 2015-09-25 MED ORDER — ALBUTEROL SULFATE (2.5 MG/3ML) 0.083% IN NEBU: 3.0000 mL | INHALATION_SOLUTION | Freq: Four times a day (QID) | RESPIRATORY_TRACT | Status: DC | PRN

## 2015-09-25 NOTE — ED Notes (Signed)
Pt is here with weakness and has been off and on sick for 5 weeks.  Pt states left foot draining and is diabetic.

## 2015-09-25 NOTE — ED Notes (Signed)
Pt reports he was walking around at the flea market and "almost passed out." pt reports he has an infected toe that has gotten worse, has appt Wednesday to have wound reassessed. Pt reports he has been on doxycycline for wound but has also had a cough x2 weeks. Pt alert and oriented, speech clear.

## 2015-09-25 NOTE — ED Provider Notes (Signed)
CSN: [id number]     Arrival date & time 09/25/15  1646 History   First MD Initiated Contact with Patient 09/25/15 1754     Chief Complaint  Patient presents with  . Weakness  . Recurrent Skin Infections    foot infection     (Consider location/radiation/quality/duration/timing/severity/associated sxs/prior Treatment) HPI Patient presents with concern of weakness, increasing redness and drainage about his left toe. Both concerns have developed over the past weeks. Patient notes a long history of poorly controlled diabetes, with recent hyperglycemia. No new focal pain, including chest pain, no new dyspnea, syncope, though he has had lightheadedness. The patient still has been an issue for several weeks, has been increasingly erythematous, swollen, with intermittent drainage from an open lesion on the distal superior aspect. Patient is scheduled for outpatient MRI later this week. He has been taking doxycycline daily for his lesion. No fever today, no vomiting, no nausea, no diarrhea.  Past Medical History  Diagnosis Date  . Diabetes mellitus   . 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 HYPERLIPIDEMIA 08/26/2007  . Diabetic foot infection ([place]) 12/26/2011  . Hypertension   . Abscess of prostate 02/17/2009   Past Surgical History  Procedure Laterality Date  . Toe amputation  08/2009    R little toe  . Amputation  12/30/2011    Procedure: AMPUTATION DIGIT;  Surgeon: Marybelle Killings, MD;  Location: [place];  Service: Orthopedics;  Laterality: Left;  Left Great  . Cardiac catheterization    . Amputation  03/20/2012    Procedure: AMPUTATION DIGIT;  Surgeon: Mcarthur Rossetti, MD;  Location: [place];  Service: Orthopedics;  Laterality: Left;  Left foot 3rd toe amputation   No family history on file. Social History  Substance Use Topics  . Smoking status: Current Some Day Smoker    Types: Cigars, Pipe  . Smokeless tobacco:  Current User    Types: Chew, Snuff     Comment: occasional chew and cigarette, pipe, cigar user  . Alcohol Use: Yes     Comment: occasional    Review of Systems  Skin: Positive for color change and wound.  Neurological: Positive for weakness.      Allergies  Review of patient's allergies indicates no known allergies.  Home Medications   Prior to Admission medications   Medication Sig Start Date End Date Taking? Authorizing Provider  ACCU-CHEK FASTCLIX LANCETS MISC 1 Units by Does not apply route 4 (four) times daily. Check sugar 10 x daily 12/16/12   Gerda Diss, MD  albuterol (PROVENTIL HFA;VENTOLIN HFA) 108 (90 BASE) MCG/ACT inhaler Inhale 2 puffs into the lungs every 6 (six) hours as needed. For cold symptoms 12/16/12   Gerda Diss, MD  aspirin 81 MG tablet Take 1 tablet (81 mg total) by mouth daily. 03/17/13   Gerda Diss, MD  Blood Glucose Monitoring Suppl (ACCU-CHEK AVIVA PLUS) W/DEVICE KIT 1 Package by Does not apply route 4 (four) times daily. 12/16/12   Gerda Diss, MD  cholecalciferol (VITAMIN D) 1000 UNITS tablet Take 1,000 Units by mouth 2 (two) times daily.    Historical Provider, MD  divalproex (DEPAKOTE ER) 500 MG 24 hr tablet Take 2 tablets (1,000 mg total) by mouth at bedtime. 12/16/12   Gerda Diss, MD  doxycycline (VIBRA-TABS) 100 MG tablet Patient preference is the blue capsules.  Take 1 capsule two times a day until gone. 09/07/15   Gardiner Barefoot, DPM  DULoxetine (CYMBALTA) 30 MG capsule Take 1 capsule (30 mg total) by mouth at bedtime. 12/16/12   Gerda Diss, MD  Ferrous Sulfate (IRON) 325 (65 FE) MG TABS Take 1 tablet by mouth daily.    Historical Provider, MD  gabapentin (NEURONTIN) 600 MG tablet Take 600 mg by mouth 3 (three) times daily.    Historical Provider, MD  glucose blood test strip Use as instructed 12/16/12   Gerda Diss, MD  insulin aspart (NOVOLOG) 100 UNIT/ML injection Inject 2-15 Units into the skin 3 (three) times daily  before meals. CBG < 70: implement hypoglycemia protocol  CBG 70 - 120: 0 units  CBG 121 - 150: 2 units  CBG 151 - 200: 3 units  CBG 201 - 250: 5 units  CBG 251 - 300: 7 units  CBG 301 - 350: 10 units  CBG 351 - 400: 12 units CBG >400:  15 Units 09/03/13   Gerda Diss, MD  insulin glargine (LANTUS) 100 UNIT/ML injection Inject 0.45 mLs (45 Units total) into the skin 2 (two) times daily. 08/03/13   Gerda Diss, MD  losartan (COZAAR) 25 MG tablet Take 25 mg by mouth daily.    Historical Provider, MD  metFORMIN (GLUCOPHAGE) 1000 MG tablet Take 1 tablet (1,000 mg total) by mouth 2 (two) times daily with a meal. 04/22/13   Gerda Diss, MD  mupirocin ointment (BACTROBAN) 2 % Apply topically 2 (two) times daily. 08/03/13   Gerda Diss, MD  omega-3 acid ethyl esters (LOVAZA) 1 G capsule Take 2 capsules (2 g total) by mouth 2 (two) times daily. 09/14/13   Gerda Diss, MD  rosuvastatin (CRESTOR) 5 MG tablet Take 1 tablet (5 mg total) by mouth daily. 03/17/13   Gerda Diss, MD   BP 137/100 mmHg  Pulse 79  Temp(Src) 97.4 F (36.3 C) (Oral)  Resp 16  SpO2 99% Physical Exam  Constitutional: He is oriented to person, place, and time. He appears well-developed. No distress.  HENT:  Head: Normocephalic and atraumatic.  Eyes: Conjunctivae and EOM are normal.  Cardiovascular: Normal rate and regular rhythm.   Pulmonary/Chest: Effort normal. No stridor. No respiratory distress.  Abdominal: He exhibits no distension.  Musculoskeletal: He exhibits no edema.       Feet:  Neurological: He is alert and oriented to person, place, and time.  Skin: Skin is warm and dry.  Psychiatric: He has a normal mood and affect.  Nursing note and vitals reviewed.   ED Course  Procedures (including critical care time) Labs Review Labs Reviewed  BASIC METABOLIC PANEL - Abnormal; Notable for the following:    Sodium 124 (*)    Chloride 87 (*)    CO2 21 (*)    Glucose, Bld 658 (*)    Creatinine,  Ser 1.33 (*)    GFR calc non Af Amer 57 (*)    Anion gap 16 (*)    All other components within normal limits  CBC - Abnormal; Notable for the following:    WBC 11.6 (*)    HCT 38.8 (*)    All other components within normal limits  CBG MONITORING, ED - Abnormal; Notable for the following:    Glucose-Capillary >600 (*)    All other components within normal limits  CBG MONITORING, ED - Abnormal; Notable for the following:    Glucose-Capillary 560 (*)    All other components within normal limits  CBG MONITORING, ED - Abnormal; Notable for  the following:    Glucose-Capillary 408 (*)    All other components within normal limits  CULTURE, BLOOD (ROUTINE X 2)  CULTURE, BLOOD (ROUTINE X 2)  URINALYSIS, ROUTINE W REFLEX MICROSCOPIC (NOT AT [facility])  SEDIMENTATION RATE  BASIC METABOLIC PANEL  CBC WITH DIFFERENTIAL/PLATELET  HEMOGLOBIN A1C  HIV ANTIBODY (ROUTINE TESTING)  SEDIMENTATION RATE  C-REACTIVE PROTEIN  PREALBUMIN  CK  LACTIC ACID, PLASMA  I-STAT VENOUS BLOOD GAS, ED  I-STAT CG4 LACTIC ACID, ED    Imaging Review Dg Foot Complete Left  09/25/2015  CLINICAL DATA:  Evaluate for osteomyelitis. Ulceration at the left foot. Initial encounter. EXAM: LEFT FOOT - COMPLETE 3+ VIEW COMPARISON:  None. FINDINGS: There appears to be osseous erosion at the distal second and third metatarsals, with associated dorsal and medial dislocation at the second metatarsophalangeal joint, and shortening at the site of dislocation. 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 No additional osseous erosions are characterized. Overlying soft tissue swelling is noted at the forefoot. Plantar and posterior calcaneal spurs are noted. Scattered vascular calcifications are seen. Visualized joint spaces are otherwise unremarkable. An os peroneum is noted. IMPRESSION: Osseous erosion at the distal second and third metatarsals, concerning for osteomyelitis, with associated dorsal and medial dislocation at the second  metatarsophalangeal joint, and shortening at the site of dislocation. Electronically Signed   By: Garald Balding M.D.   On: 09/25/2015 19:09   I have personally reviewed and evaluated these images and lab results as part of my medical decision-making.   EKG Interpretation   Date/Time:  Sunday September 25 2015 17:15:56 EDT Ventricular Rate:  81 PR Interval:  190 QRS Duration: 148 QT Interval:  400 QTC Calculation: 464 R Axis:   -65 Text Interpretation:  Normal sinus rhythm Right bundle branch block Left  anterior fascicular block  Bifascicular block  Left ventricular  hypertrophy Cannot rule out Septal infarct , age undetermined Abnormal ECG  Sinus rhythm Right bundle branch block Left anterior fasicular block Left  axis deviation Left ventricular hypertrophy Abnormal ekg Confirmed by  Carmin Muskrat  MD ([id number]) on 09/25/2015 6:37:13 PM     Initial glucose greater than 600. With initial concern for cellulitis versus osteomyelitis, patient received IV fluids, antibiotics, insulin.  Update: Labs notable for hyperglycemia, anion gap of greater than 20 hyponatremia.  X-ray demonstrates findings concerning for osteomyelitis. MDM  This patient with history of poorly controlled diabetes, nonhealing foot wound now presents with generalized weakness, and concern for progression of the wound. Here the patient is awake, alert, afebrile, but not to be hyperglycemic, with anion gap, as well as with new x-ray evidence of osteomyelitis. Patient received empiric IV fluid resuscitation, vancomycin, as required initiation of continuous insulin drip.  Patient required admission to the step down unit for further evaluation and management.  CRITICAL CARE Performed by: Carmin Muskrat Total critical care time: 35 minutes Critical care time was exclusive of separately billable procedures and treating other patients. Critical care was necessary to treat or prevent imminent or life-threatening  deterioration. Critical care was time spent personally by me on the following activities: development of treatment plan with patient and/or surrogate as well as nursing, discussions with consultants, evaluation of patient's response to treatment, examination of patient, obtaining history from patient or surrogate, ordering and performing treatments and interventions, ordering and review of laboratory studies, ordering and review of radiographic studies, pulse oximetry and re-evaluation of patient's condition.   Carmin Muskrat, MD 09/25/15 2005

## 2015-09-25 NOTE — ED Notes (Signed)
CBG 408

## 2015-09-25 NOTE — ED Notes (Signed)
Left urinal with pt to provide a urine sample.

## 2015-09-25 NOTE — H&P (Signed)
[facility] History and Physical  Patient: Caleb Joseph  MRN: [id number]  DOB: 1956/08/10  DOS: the patient was seen and examined on 09/25/2015 PCP: Chesley Noon, MD  Referring physician: Dr Vanita Panda Chief Complaint: weakness  HPI: Caleb Joseph is a 59 y.o. male with Past medical history of type 2 diabetes mellitus, chronic neuropathy, hypertension, chronic osteomyelitis of the left foot. Patient is presenting with complaints of generalized weakness. Patient mentions that he has ongoing infection of the left foot and has been following up with the podiatrist as an outpatient. Patient was scheduled to get an MRI sometime next week. Today while he was in a grocery store and suddenly felt weak and lightheaded and he felt that his sugars were running low. After drinking some soda he felt somewhat better and went home. He continues to have generalized weakness and therefore decided to come to the hospital. He denies any polyuria or polydipsia. Denies any diarrhea or constipation or active bleeding or abdominal pain. Denies any chest pain shortness of breath cough fever chills. He mentions he is compliant with all his medications but since last 5 weeks he has not been taking any of his medications since he has been feeling on and off nauseated and weak and he mentions when he feels weak and sick he does not take any of his medications. Patient lives at home alone and does not have any assistance. Patient mentions he does daily home dressing.  The patient is coming from home.  At his baseline ambulates with support And is independent for most of his ADL; manages his medication on his own.  Review of Systems: as mentioned in the history of present illness.  A comprehensive review of the other systems is negative.  Past Medical History  Diagnosis Date  . Diabetes mellitus   . 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 DIABETIC PERIPHERAL NEUROPATHY 10/02/2007   . HYPERLIPIDEMIA 08/26/2007  . Diabetic foot infection ([place]) 12/26/2011  . Hypertension   . Abscess of prostate 02/17/2009   Past Surgical History  Procedure Laterality Date  . Toe amputation  08/2009    R little toe  . Amputation  12/30/2011    Procedure: AMPUTATION DIGIT;  Surgeon: Marybelle Killings, MD;  Location: [place];  Service: Orthopedics;  Laterality: Left;  Left Great  . Cardiac catheterization    . Amputation  03/20/2012    Procedure: AMPUTATION DIGIT;  Surgeon: Mcarthur Rossetti, MD;  Location: [place];  Service: Orthopedics;  Laterality: Left;  Left foot 3rd toe amputation   Social History:  reports that he has been smoking Cigars and Pipe.  His smokeless tobacco use includes Chew and Snuff. He reports that he drinks alcohol. He reports that he does not use illicit drugs.  No Known Allergies  No family history on file.  Prior to Admission medications   Medication Sig Start Date End Date Taking? Authorizing Provider  albuterol (PROVENTIL HFA;VENTOLIN HFA) 108 (90 BASE) MCG/ACT inhaler Inhale 2 puffs into the lungs every 6 (six) hours as needed. For cold symptoms Patient taking differently: Inhale 2 puffs into the lungs every 6 (six) hours as needed (cold symptoms). For cold symptoms 12/16/12  Yes Gerda Diss, MD  aspirin 81 MG tablet Take 1 tablet (81 mg total) by mouth daily. 03/17/13  Yes Gerda Diss, MD  cholecalciferol (VITAMIN D) 1000 UNITS tablet Take 1,000 Units by mouth daily. 06/23/15  Yes Historical Provider, MD  divalproex (DEPAKOTE) 500 MG DR  tablet Take 1,500 mg by mouth at bedtime. 08/23/15  Yes Historical Provider, MD  DULoxetine (CYMBALTA) 30 MG capsule Take 1 capsule (30 mg total) by mouth at bedtime. 12/16/12  Yes Gerda Diss, MD  Ferrous Sulfate (IRON) 325 (65 FE) MG TABS Take 1 tablet by mouth daily.   Yes Historical Provider, MD  folic acid (FOLVITE) 969 MCG tablet Take 800 mcg by mouth daily. 06/23/15  Yes Historical Provider, MD  GABAPENTIN PO Take 2  tablets by mouth at bedtime.   Yes Historical Provider, MD  insulin aspart (NOVOLOG FLEXPEN) 100 UNIT/ML FlexPen Inject 3-10 Units into the skin 3 (three) times daily as needed for high blood sugar (CBG >200).   Yes Historical Provider, MD  Insulin Glargine (TOUJEO SOLOSTAR) 300 UNIT/ML SOPN Inject 40 Units into the skin at bedtime.   Yes Historical Provider, MD  metFORMIN (GLUCOPHAGE) 1000 MG tablet Take 1 tablet (1,000 mg total) by mouth 2 (two) times daily with a meal. 04/22/13  Yes Gerda Diss, MD  Multiple Vitamins-Minerals (EYE VITAMINS) TABS Take 1 tablet by mouth daily. 07/16/15  Yes Historical Provider, MD  omega-3 acid ethyl esters (LOVAZA) 1 G capsule Take 2 capsules (2 g total) by mouth 2 (two) times daily. 09/14/13  Yes Gerda Diss, MD  PRESCRIPTION MEDICATION Take 1 tablet by mouth daily. New blood pressure medication 6-8 months ago   Yes Historical Provider, MD  rosuvastatin (CRESTOR) 5 MG tablet Take 1 tablet (5 mg total) by mouth daily. 03/17/13  Yes Gerda Diss, MD  ACCU-CHEK FASTCLIX LANCETS MISC 1 Units by Does not apply route 4 (four) times daily. Check sugar 10 x daily 12/16/12   Gerda Diss, MD  Blood Glucose Monitoring Suppl (ACCU-CHEK AVIVA PLUS) W/DEVICE KIT 1 Package by Does not apply route 4 (four) times daily. 12/16/12   Gerda Diss, MD  divalproex (DEPAKOTE ER) 500 MG 24 hr tablet Take 2 tablets (1,000 mg total) by mouth at bedtime. Patient not taking: Reported on 09/25/2015 12/16/12   Gerda Diss, MD  gabapentin (NEURONTIN) 600 MG tablet Take 600 mg by mouth 3 (three) times daily.    Historical Provider, MD  glucose blood test strip Use as instructed 12/16/12   Gerda Diss, MD  insulin aspart (NOVOLOG) 100 UNIT/ML injection Inject 2-15 Units into the skin 3 (three) times daily before meals. CBG < 70: implement hypoglycemia protocol  CBG 70 - 120: 0 units  CBG 121 - 150: 2 units  CBG 151 - 200: 3 units  CBG 201 - 250: 5 units  CBG 251 - 300: 7  units  CBG 301 - 350: 10 units  CBG 351 - 400: 12 units CBG >400:  15 Units Patient not taking: Reported on 09/25/2015 09/03/13   Gerda Diss, MD  insulin glargine (LANTUS) 100 UNIT/ML injection Inject 0.45 mLs (45 Units total) into the skin 2 (two) times daily. Patient not taking: Reported on 09/25/2015 08/03/13   Gerda Diss, MD    Physical Exam: Filed Vitals:   09/25/15 2057 09/25/15 2100 09/25/15 2115 09/25/15 2142  BP:  136/78 134/74 160/50  Pulse: 76 75 73   Temp:    97.9 F (36.6 C)  TempSrc:    Oral  Resp: _0 Height:      Weight:      SpO2: 100% 100% 100% 100%    General: Alert, Awake and Oriented to Time, Place and Person. Appear in mild  distress Eyes: PERRL ENT: Oral Mucosa clear poor dentition moist. Neck: no JVD Cardiovascular: S1 and S2 Present, aortic Murmur, Peripheral Pulses Present Respiratory: Bilateral Air entry equal and Decreased,  Clear to Auscultation, no Crackles, no wheezes Abdomen: Bowel Sound present, Soft and no tenderness Skin: redness and warmth of foot, ulcer on left second toe Extremities: bilateral Pedal edema, no calf tenderness Neurologic: Grossly no focal neuro deficit other than chronic lower extremities sensory loss due to neuropathy, left more than right  Labs on Admission:  CBC:  Recent Labs Lab 09/25/15 1733  WBC 11.6*  HGB 13.2  HCT 38.8*  MCV 88.8  PLT 156    CMP     Component Value Date/Time   NA 124* 09/25/2015 1733   K 4.7 09/25/2015 1733   CL 87* 09/25/2015 1733   CO2 21* 09/25/2015 1733   GLUCOSE 658* 09/25/2015 1733   BUN 17 09/25/2015 1733   CREATININE 1.33* 09/25/2015 1733   CREATININE 1.25 08/03/2013 1455   CALCIUM 9.5 09/25/2015 1733   PROT 7.0 08/03/2013 1455   ALBUMIN 4.4 08/03/2013 1455   AST 14 08/03/2013 1455   ALT 12 08/03/2013 1455   ALKPHOS 52 08/03/2013 1455   BILITOT 0.6 08/03/2013 1455   GFRNONAA 57* 09/25/2015 1733   GFRAA >60 09/25/2015 1733     Recent Labs Lab  09/25/15 2040  CKTOTAL 95   BNP (last 3 results) No results for input(s): BNP in the last 8760 hours.  ProBNP (last 3 results) No results for input(s): PROBNP in the last 8760 hours.   Radiological Exams on Admission: Dg Foot Complete Left  09/25/2015  CLINICAL DATA:  Evaluate for osteomyelitis. Ulceration at the left foot. Initial encounter. EXAM: LEFT FOOT - COMPLETE 3+ VIEW COMPARISON:  None. FINDINGS: There appears to be osseous erosion at the distal second and third metatarsals, with associated dorsal and medial dislocation at the second metatarsophalangeal joint, and shortening at the site of dislocation. 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 No additional osseous erosions are characterized. Overlying soft tissue swelling is noted at the forefoot. Plantar and posterior calcaneal spurs are noted. Scattered vascular calcifications are seen. Visualized joint spaces are otherwise unremarkable. An os peroneum is noted. IMPRESSION: Osseous erosion at the distal second and third metatarsals, concerning for osteomyelitis, with associated dorsal and medial dislocation at the second metatarsophalangeal joint, and shortening at the site of dislocation. Electronically Signed   By: Garald Balding M.D.   On: 09/25/2015 19:09   Assessment/Plan 1. DKA (diabetic ketoacidoses) ([place]) Patient is presenting with complaints of chronic nausea noncompliance with medication regimen and generalized weakness. He was found to be having significant hyperglycemia with anion gap and suggesting DKA. Patient has currently received IV folder bolus. I would continue him on insulin IV infusion but glucose stabilizer. We will monitor her BMP every 4 hours. Once his anion gap is less than 14 patient will be transitioned to home insulin regimen. Patient remains nothing by mouth except medications.  2. Posterior mellitus of the left foot. Most likely secondary to chronic diabetic neuropathy. Patient will be treated  with vancomycin ceftriaxone and Flagyl in the setting of lactic acidosis. We will check vascular ABI. Patient may benefit from MRI as well. Follow cultures. Check CPK ESR CRP. Patient is currently admitted in stepdown unit.   3  Diabetic neuropathy ([place])  continue home medication.  4  Dyslipidemia  continue home medication.  5  Essential hypertension Patient takes some blood pressure medications at  home the name of which he does not remember. Blood pressure currently stable continue monitoring.  6  PVD  vascular ABI in the morning.  7  Hyponatremia  pseudohyponatremia in the setting of hyperglycemia. Monitor BMP.  8  Lactic acidosis most likely in the setting of dehydration but possibility of sepsis cannot be ruled out. Patient is currently on broad-spectrum antibiotics and IV resuscitation. We will recheck lactic acid in the morning.  Nutrition: npo except medication DVT Prophylaxis: subcutaneous Heparin  Advance goals of care discussion: full code   Disposition: Admitted as inpatient, step-down unit.  Author: Berle Mull, MD [facility] Pager: [phone] 09/25/2015  If 7PM-7AM, please contact night-coverage [email or web address] Password [email or web address]

## 2015-09-25 NOTE — ED Notes (Signed)
Sabrina RN made aware of cbg.

## 2015-09-25 NOTE — ED Notes (Signed)
CBG 560 prior to insulin administration.

## 2015-09-25 NOTE — Progress Notes (Addendum)
ANTIBIOTIC CONSULT NOTE - INITIAL  Pharmacy Consult for vancomycin/fortaz Indication: cellulitis  No Known Allergies  Patient Measurements:    Vital Signs: Temp: 97.4 F (36.3 C) (10/30 1705) Temp Source: Oral (10/30 1705) BP: 143/72 mmHg (10/30 1830) Pulse Rate: 78 (10/30 1830) Intake/Output from previous day:   Intake/Output from this shift:    Labs:  Recent Labs  09/25/15 1733  WBC 11.6*  HGB 13.2  PLT 156  CREATININE 1.33*   CrCl cannot be calculated (Unknown ideal weight.). No results for input(s): VANCOTROUGH, VANCOPEAK, VANCORANDOM, GENTTROUGH, GENTPEAK, GENTRANDOM, TOBRATROUGH, TOBRAPEAK, TOBRARND, AMIKACINPEAK, AMIKACINTROU, AMIKACIN in the last 72 hours.   Microbiology: No results found for this or any previous visit (from the past 720 hour(s)).  Medical History: Past Medical History  Diagnosis Date  . Diabetes mellitus   . 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 HYPERLIPIDEMIA 08/26/2007  . Diabetic foot infection ([place]) 12/26/2011  . Hypertension   . Abscess of prostate 02/17/2009   Assessment: 59 year old male presents to [facility] with recurrent foot cellulitis.   Afeb, wbc 11.6, glucose 658, scr 1.33. New orders to start IV fortaz and vancomycin for cellulitis. High suspicion for MRSA.  Goal of Therapy:  Vancomycin trough level 10-15 mcg/ml  Plan:  Measure antibiotic drug levels at steady state Follow up culture results Vancomycin 2.5g IV x1 now then 750mg  IV q12 hours Fortaz 2g IV q8 hours  Erin Hearing PharmD., BCPS Clinical Pharmacist Pager [phone] 09/25/2015 7:23 PM

## 2015-09-26 ENCOUNTER — Inpatient Hospital Stay (HOSPITAL_COMMUNITY): Payer: Medicare HMO

## 2015-09-26 DIAGNOSIS — E0811 Diabetes mellitus due to underlying condition with ketoacidosis with coma: Secondary | ICD-10-CM

## 2015-09-26 LAB — BASIC METABOLIC PANEL
ANION GAP: 15 (ref 5–15)
ANION GAP: 10 (ref 5–15)
BUN: 16 mg/dL (ref 6–20)
BUN: 15 mg/dL (ref 6–20)
CHLORIDE: 99 mmol/L — AB (ref 101–111)
CO2: 21 mmol/L — AB (ref 22–32)
CO2: 24 mmol/L (ref 22–32)
Calcium: 9.3 mg/dL (ref 8.9–10.3)
Calcium: 9 mg/dL (ref 8.9–10.3)
Chloride: 101 mmol/L (ref 101–111)
Creatinine, Ser: 0.96 mg/dL (ref 0.61–1.24)
Creatinine, Ser: 0.93 mg/dL (ref 0.61–1.24)
GFR calc non Af Amer: >60 mL/min (ref >60)
GFR calc non Af Amer: >60 mL/min (ref >60)
GLUCOSE: 240 mg/dL — AB (ref 65–99)
GLUCOSE: 187 mg/dL — AB (ref 65–99)
POTASSIUM: 4.5 mmol/L (ref 3.5–5.1)
POTASSIUM: 4 mmol/L (ref 3.5–5.1)
Sodium: 135 mmol/L (ref 135–145)
Sodium: 135 mmol/L (ref 135–145)

## 2015-09-26 LAB — GLUCOSE, CAPILLARY
GLUCOSE-CAPILLARY: 192 mg/dL — AB (ref 65–99)
GLUCOSE-CAPILLARY: 319 mg/dL — AB (ref 65–99)
GLUCOSE-CAPILLARY: 242 mg/dL — AB (ref 65–99)
GLUCOSE-CAPILLARY: 110 mg/dL — AB (ref 65–99)
GLUCOSE-CAPILLARY: 277 mg/dL — AB (ref 65–99)
Glucose-Capillary: 138 mg/dL — ABNORMAL HIGH (ref 65–99)
Glucose-Capillary: 158 mg/dL — ABNORMAL HIGH (ref 65–99)
Glucose-Capillary: 169 mg/dL — ABNORMAL HIGH (ref 65–99)
Glucose-Capillary: 193 mg/dL — ABNORMAL HIGH (ref 65–99)
Glucose-Capillary: 241 mg/dL — ABNORMAL HIGH (ref 65–99)
Glucose-Capillary: 268 mg/dL — ABNORMAL HIGH (ref 65–99)

## 2015-09-26 LAB — SEDIMENTATION RATE: Sed Rate: 25 mm/hr — ABNORMAL HIGH (ref 0–16)

## 2015-09-26 LAB — HEMOGLOBIN A1C
HEMOGLOBIN A1C: 11.4 % — AB (ref 4.8–5.6)
MEAN PLASMA GLUCOSE: 280 mg/dL

## 2015-09-26 LAB — MRSA PCR SCREENING: MRSA by PCR: NEGATIVE

## 2015-09-26 LAB — HIV ANTIBODY (ROUTINE TESTING W REFLEX): HIV SCREEN 4TH GENERATION: NONREACTIVE

## 2015-09-26 MED ORDER — INSULIN GLARGINE 100 UNIT/ML ~~LOC~~ SOLN
25.0000 [IU] | Freq: Two times a day (BID) | SUBCUTANEOUS | Status: DC
  Administered 2015-09-26 – 2015-09-27 (×3): 25 [IU] via SUBCUTANEOUS
  Filled 2015-09-26 (×5): qty 0.25

## 2015-09-26 MED ORDER — PRO-STAT SUGAR FREE PO LIQD
30.0000 mL | Freq: Two times a day (BID) | ORAL | Status: DC
  Administered 2015-09-26 – 2015-09-29 (×6): 30 mL via ORAL
  Filled 2015-09-26 (×6): qty 30

## 2015-09-26 MED ORDER — INSULIN GLARGINE 100 UNIT/ML ~~LOC~~ SOLN: 25.0000 [IU] | Freq: Two times a day (BID) | SUBCUTANEOUS | Status: DC

## 2015-09-26 MED ORDER — INSULIN ASPART 100 UNIT/ML ~~LOC~~ SOLN
3.0000 [IU] | Freq: Three times a day (TID) | SUBCUTANEOUS | Status: DC
  Administered 2015-09-26 – 2015-09-29 (×6): 3 [IU] via SUBCUTANEOUS

## 2015-09-26 MED ORDER — MUPIROCIN CALCIUM 2 % EX CREA
TOPICAL_CREAM | Freq: Two times a day (BID) | CUTANEOUS | Status: DC
  Administered 2015-09-26 – 2015-09-29 (×2): via TOPICAL
  Filled 2015-09-26 (×2): qty 15

## 2015-09-26 MED ORDER — PNEUMOCOCCAL VAC POLYVALENT 25 MCG/0.5ML IJ INJ
0.5000 mL | INJECTION | INTRAMUSCULAR | Status: AC
  Administered 2015-09-27: 0.5 mL via INTRAMUSCULAR
  Filled 2015-09-26: qty 0.5

## 2015-09-26 MED ORDER — CETYLPYRIDINIUM CHLORIDE 0.05 % MT LIQD
7.0000 mL | Freq: Two times a day (BID) | OROMUCOSAL | Status: DC
Start: 2015-09-26 — End: 2015-09-29
  Administered 2015-09-26 – 2015-09-29 (×6): 7 mL via OROMUCOSAL

## 2015-09-26 MED ORDER — INSULIN ASPART 100 UNIT/ML ~~LOC~~ SOLN
0.0000 [IU] | Freq: Three times a day (TID) | SUBCUTANEOUS | Status: DC
  Administered 2015-09-26: 5 [IU] via SUBCUTANEOUS
  Administered 2015-09-26 – 2015-09-27 (×2): 3 [IU] via SUBCUTANEOUS
  Administered 2015-09-27: 5 [IU] via SUBCUTANEOUS
  Administered 2015-09-28 – 2015-09-29 (×2): 1 [IU] via SUBCUTANEOUS

## 2015-09-26 MED ORDER — LISINOPRIL 5 MG PO TABS
2.5000 mg | ORAL_TABLET | Freq: Every day | ORAL | Status: DC
  Administered 2015-09-27 – 2015-09-29 (×4): 2.5 mg via ORAL
  Filled 2015-09-26 (×4): qty 1

## 2015-09-26 NOTE — Progress Notes (Signed)
Inpatient Diabetes Program Recommendations  AACE/ADA: New Consensus Statement on Inpatient Glycemic Control (2015)  Target Ranges:  Prepandial:   less than 140 mg/dL      Peak postprandial:   less than 180 mg/dL (1-2 hours)      Critically ill patients:  140 - 180 mg/dL   Review of Glycemic Control  Inpatient Diabetes Program Recommendations:  Correction (SSI): Add Novolog moderate scale TID + HS scale per Glycemic Control order-set Thank you  Raoul Pitch BSN, RN,CDE Inpatient Diabetes Coordinator [phone] (team pager)

## 2015-09-26 NOTE — Progress Notes (Signed)
Initial Nutrition Assessment  DOCUMENTATION CODES:   Obesity unspecified  INTERVENTION:   Prostat liquid protein po 30 ml BID with meals, each supplement provides 100 kcal, 15 grams protein  NUTRITION DIAGNOSIS:   Increased nutrient needs related to wound healing as evidenced by estimated needs   GOAL:   Patient will meet greater than or equal to 90% of their needs  MONITOR:   PO intake, Supplement acceptance, Labs, Weight trends, Skin, I & O's  REASON FOR ASSESSMENT:   Consult Wound healing  ASSESSMENT:   59 y.o. male with PMH of type 2 diabetes mellitus, chronic neuropathy, hypertension, chronic osteomyelitis of the left foot; presented with generalized weakness.  Patient reports a good appetite.  Ready to eat his lunch.  No recent weight loss.  CWCON note reviewed.  Pt with full thickness wound to L foot.  Diet Order:  Diet Carb Modified Fluid consistency:: Thin; Room service appropriate?: Yes  Skin:  Wound (see comment) (diabetic foot ulcer)  Last BM:  10/29  Height:   Ht Readings from Last 1 Encounters:  09/25/15 6' 3.2" (1.91 m)    Weight:   Wt Readings from Last 1 Encounters:  09/25/15 246 lb 14.6 oz (112 kg)    Ideal Body Weight:  89 kg  BMI:  Body mass index is 30.7 kg/(m^2).  Estimated Nutritional Needs:   Kcal:  2100-2300  Protein:  110-120 gm  Fluid:  2.1-2.3 L  EDUCATION NEEDS:   No education needs identified at this time  Arthur Holms, RD, LDN Pager #: [phone] After-Hours Pager #: [phone]

## 2015-09-26 NOTE — Progress Notes (Signed)
Caleb Joseph [id number] DOB: Jun 15, 1956 DOA: 09/25/2015 PCP: Chesley Noon, MD  Brief narrative:  59 y/o ? 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 Jeannetta Ellis for orthopedic care and was supposed to have an MRI later this week   Past medical history-As per Problem list Chart reviewed as below- None  Consultants:  None yet  Procedures:  None  Antibiotics:  Vancomycin  Fortaz   Subjective   Feels a little better Tells me foot has cleared this with 4 100. A time and has not taken antibiotics No fever, no chills, no vomiting, no nausea, no diarrhea   Objective    Interim History:   Telemetry: Sinus rhythm   Objective: Filed Vitals:   09/25/15 2115 09/25/15 2142 09/26/15 0011 09/26/15 0400  BP: 134/74 160/50 153/94 104/73  Pulse: 73  88   Temp:  97.9 F (36.6 C) 97.9 F (36.6 C) 98 F (36.7 C)  TempSrc:  Oral Oral   Resp: 25  18 17   Height:      Weight:      SpO2: 100% 100% 100% 100%    Intake/Output Summary (Last 24 hours) at 09/26/15 2353 Last data filed at 09/26/15 0012  Gross per 24 hour  Intake      0 ml  Output    300 ml  Net   -300 ml    Exam:  General: EOMI NCAT obese, moderate dentition Cardiovascular: S1-S2 slightly tachycardic Respiratory: No rales no rhonchi no added sound Abdomen: Soft nontender nondistended no rebound Skin Foot swollen- Prior Area of third toe removal, swelling and erythema noted around the forefoot on the left. Neuro  sensory intact however cannot feel dorsalis pedis  Data Reviewed: Basic Metabolic Panel:  Recent Labs Lab 09/25/15 1733 09/25/15 2040 09/26/15 0122 09/26/15 0406  NA 124* 130* 135 135  K 4.7 4.6 4.5 4.0  CL 87* 90* 99* 101  CO2 21* 22 21* 24  GLUCOSE 658* 366* 240* 187*  BUN 17 18 16 15   CREATININE 1.33* 1.28* 0.96 0.93  CALCIUM 9.5 9.7 9.3 9.0   Liver Function Tests: No results for input(s): AST, ALT, ALKPHOS, BILITOT, PROT, ALBUMIN in the last 168 hours. No results for input(s): LIPASE, AMYLASE in the last 168 hours. No results for input(s): AMMONIA in the last 168 hours. CBC:  Recent Labs Lab 09/25/15 1733 09/25/15 2155  WBC 11.6* 9.8  NEUTROABS  --  6.6  HGB 13.2 12.2*  HCT 38.8* 35.8*  MCV 88.8 88.0  PLT 156 152   Cardiac Enzymes:  Recent Labs Lab 09/25/15 2040  CKTOTAL 95   BNP: Invalid input(s): POCBNP CBG:  Recent Labs Lab 09/26/15 0144 09/26/15 0250 09/26/15 0356 09/26/15 0509 09/26/15 0626  GLUCAP 319* 242* 158* 138* 110*    Recent Results (from the past 240 hour(s))  Blood Cultures x 2 sites     Status: None (Preliminary result)   Collection Time: 09/25/15  8:40 PM  Result Value Ref Range Status   Specimen Description BLOOD RIGHT FOREARM  Final  Special Requests BOTTLES DRAWN AEROBIC AND ANAEROBIC 5CC  Final   Culture NO GROWTH < 12 HOURS  Final   Report Status PENDING  Incomplete  MRSA PCR Screening     Status: None   Collection Time: 09/25/15  9:48 PM  Result Value Ref Range Status   MRSA by PCR NEGATIVE NEGATIVE Final    Comment:        The GeneXpert MRSA Assay (FDA approved for NASAL specimens only), is one component of a comprehensive MRSA colonization surveillance program. It is not intended to diagnose MRSA infection nor to guide or monitor treatment for MRSA infections.   Blood Cultures x 2 sites     Status: None (Preliminary result)   Collection Time: 09/25/15  9:55 PM  Result Value Ref Range Status   Specimen  Description BLOOD LEFT HAND  Final   Special Requests BOTTLES DRAWN AEROBIC AND ANAEROBIC 5CC  Final   Culture NO GROWTH < 12 HOURS  Final   Report Status PENDING  Incomplete     Studies:              All Imaging reviewed and is as per above notation   Scheduled Meds: . antiseptic oral rinse  7 mL Mouth Rinse BID  . aspirin EC  81 mg Oral Daily  . cefTAZidime (FORTAZ)  IV  2 g Intravenous 3 times per day  . divalproex  1,500 mg Oral QHS  . DULoxetine  30 mg Oral QHS  . famotidine (PEPCID) IV  20 mg Intravenous Q12H  . ferrous sulfate  325 mg Oral Daily  . gabapentin  600 mg Oral TID  . heparin  5,000 Units Subcutaneous 3 times per day  . insulin aspart  0-9 Units Subcutaneous TID WC  . insulin aspart  3 Units Subcutaneous TID WC  . insulin glargine  25 Units Subcutaneous BID  . metronidazole  500 mg Intravenous Q8H  . rosuvastatin  5 mg Oral Daily  . vancomycin  750 mg Intravenous Q12H   Continuous Infusions: . sodium chloride 100 mL/hr at 09/26/15 0550     Assessment/Plan:   1. DKA-initial anion gap 16, bicarbonate 21-completely resolved now. See below 2. Uncontrolled diabetes mellitus type 2 secondary to noncompliance with neuropathy, illness-continue Levemir and lower dose 25 units twice a day as opposed to 40 units. Continue sliding scale insulin 3 times a day. Blood sugars in the 100 range. Continue gabapentin 600 3 times a day TRANSFER to telemetry 3. Probable diabetic foot ulcer. Continue Fortaz, Flagyl, vancomycin. MRI of the foot has been ordered and if this confirms osteomyelitis who will consult orthopedics 4. Hyperlipidemia continue Crestor 5 mg daily 5. Bipolar-continue Depakote 1500 daily at bedtime, Cymbalta 30 daily 6. Obesity- Body mass index is 30.7 kg/(m^2). needs outpatient follow-up 7. Iron deficiency anemia-continue FeSO4 144 daily, folic acid 818 daily  Code Status: Full Family Communication: None at bedside Disposition Plan: inpatient   Verneita Griffes, MD  [facility] Pager [phone] 09/26/2015, 9:52 AM    LOS: 1 day

## 2015-09-26 NOTE — Progress Notes (Signed)
Utilization review completed. Danali Marinos, RN, BSN. 

## 2015-09-26 NOTE — Consult Note (Addendum)
WOC consult requested for left foot. X-ray indicates possible osteomyelitis.   This complex medical condition is beyond the [facility] scope of practice.  Please consult ortho service for further plan of care. MRI and ABI results are pending.  Left anterior foot with previous amputation sites.  Full thickness wound .8X.8cm, dry dark red scabbed wound bed, surrounded by generalized erythremia and edema. Pt has been using Medihoney prior to admission.  This product is not available in the [place]. Bactroban to provide moist healing until further input available. Please re-consult if further assistance is needed.  Thank-you,  Julien Girt MSN, [place], [place], [place], [place]

## 2015-09-27 ENCOUNTER — Inpatient Hospital Stay (HOSPITAL_COMMUNITY): Payer: Medicare HMO

## 2015-09-27 DIAGNOSIS — E1010 Type 1 diabetes mellitus with ketoacidosis without coma: Secondary | ICD-10-CM

## 2015-09-27 DIAGNOSIS — E1310 Other specified diabetes mellitus with ketoacidosis without coma: Secondary | ICD-10-CM | POA: Diagnosis not present

## 2015-09-27 DIAGNOSIS — I739 Peripheral vascular disease, unspecified: Secondary | ICD-10-CM

## 2015-09-27 LAB — GLUCOSE, CAPILLARY
GLUCOSE-CAPILLARY: 239 mg/dL — AB (ref 65–99)
GLUCOSE-CAPILLARY: 181 mg/dL — AB (ref 65–99)
Glucose-Capillary: 261 mg/dL — ABNORMAL HIGH (ref 65–99)
Glucose-Capillary: 269 mg/dL — ABNORMAL HIGH (ref 65–99)

## 2015-09-27 MED ORDER — INSULIN GLARGINE 100 UNIT/ML ~~LOC~~ SOLN
40.0000 [IU] | Freq: Two times a day (BID) | SUBCUTANEOUS | Status: DC
  Administered 2015-09-27: 40 [IU] via SUBCUTANEOUS
  Filled 2015-09-27 (×3): qty 0.4

## 2015-09-27 MED ORDER — FAMOTIDINE 20 MG PO TABS
20.0000 mg | ORAL_TABLET | Freq: Two times a day (BID) | ORAL | Status: DC
  Administered 2015-09-27 – 2015-09-29 (×4): 20 mg via ORAL
  Filled 2015-09-27 (×4): qty 1

## 2015-09-27 NOTE — Clinical Documentation Improvement (Signed)
Internal Medicine  Can the diagnosis of Hyperlipidemia be further specified? Thank you   Specify the type as being:  Group A- pure hypercholesterolemia.  Group B- pure hyperglyceridemia.  Group C- mixed hyperlipidemia.  Group D- hyperchylomicronemia.  Familial combined hyperlipidemia.  Other condition  Clinically Undertermined    Please exercise your independent, professional judgment when responding. A specific answer is not anticipated or expected.   Thank You, Elizabeth [phone]

## 2015-09-27 NOTE — Consult Note (Signed)
  Caleb Joseph is a poorly controlled diabetic with peripheral neuropathy and a history of foot surgery by my group.  It was been 3 years since he has been seen and he now has a Podiatrist that he sees.  My partner Dr. Lorin Mercy removed his left great toe and then I removed his left 3rd toe, both secondary to infection/osteo.  He has recently been admitted for DKA and is noted to have a left foot wound that is draining.  He has plain films of that foot showing destructive changes consistent with osteo and neuropathic changes.  A MRI of his foot is pending.  On exam, there is a wound with drainage at the forefoot where the 3rd toe was removed.  Rec: Caleb Joseph is aware that he will need additional surgery on his left foot with at minimum an irrigation and debridement of the 3rd ray and possibly a second toe amputation.  He will likely need a transmetatarsal amputation down the road, but he will not consent to this in light of a thorough discussion.  I will plan on surgery tomorrow 11/2 after 5 pm.

## 2015-09-27 NOTE — Progress Notes (Signed)
VASCULAR LAB PRELIMINARY  ARTERIAL  ABI completed:    RIGHT    LEFT    PRESSURE WAVEFORM  PRESSURE WAVEFORM  BRACHIAL 146 Triphasic BRACHIAL 146 Triphasic  DP 174 Triphasic DP 162 Triphasic  PT 203 Triphasic PT 204 Triphasic    RIGHT 146LEFT  ABI 1.39 1.40   ABIs and Doppler waveforms are within normal limits bilaterally at rest.  Arel Tippen, RVS 09/27/2015, 9:47 AM

## 2015-09-27 NOTE — Progress Notes (Signed)
Caleb Joseph [id number] DOB: 1956/06/16 DOA: 09/25/2015 PCP: Chesley Noon, MD  Brief narrative:  59 y/o ? 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 Jeannetta Ellis for orthopedic care and was supposed to have an MRI later this week   Past medical history-As per Problem list Chart reviewed as below- None  Consultants:  None yet  Procedures:  None  Antibiotics:  Vancomycin  Tressie Ellis   Subjective   Fair No fever chills No pain in leg tol po well strange affect   Objective    Interim History:   Telemetry: Sinus rhythm   Objective: Filed Vitals:   09/26/15 0400 09/26/15 1300 09/26/15 2204 09/27/15 0527  BP: 104/73  147/86 108/62  Pulse:   87 70  Temp: 98 F (36.7 C) 97.7 F (36.5 C) 98.4 F (36.9 C) 97.7 F (36.5 C)  TempSrc:  Oral Oral Oral  Resp: _0 Height:      Weight:      SpO2: 100%  84% 100%    Intake/Output Summary (Last 24 hours) at 09/27/15 1256 Last data filed at 09/27/15 1046  Gross per 24 hour  Intake   1737 ml  Output   2610 ml  Net   -873 ml    Exam:  General: EOMI NCAT obese, moderate dentition Cardiovascular: S1-S2 slightly tachycardic Respiratory: No rales no rhonchi no added sound Abdomen: Soft nontender nondistended no rebound Skin Foot bandaged  Data Reviewed: Basic Metabolic Panel:  Recent Labs Lab 09/25/15 1733 09/25/15 2040 09/26/15 0122 09/26/15 0406  NA 124* 130* 135 135  K 4.7 4.6 4.5 4.0  CL 87* 90* 99* 101    CO2 21* 22 21* 24  GLUCOSE 658* 366* 240* 187*  BUN _1 CREATININE 1.33* 1.28* 0.96 0.93  CALCIUM 9.5 9.7 9.3 9.0   Liver Function Tests: No results for input(s): AST, ALT, ALKPHOS, BILITOT, PROT, ALBUMIN in the last 168 hours. No results for input(s): LIPASE, AMYLASE in the last 168 hours. No results for input(s): AMMONIA in the last 168 hours. CBC:  Recent Labs Lab 09/25/15 1733 09/25/15 2155  WBC 11.6* 9.8  NEUTROABS  --  6.6  HGB 13.2 12.2*  HCT 38.8* 35.8*  MCV 88.8 88.0  PLT 156 152   Cardiac Enzymes:  Recent Labs Lab 09/25/15 2040  CKTOTAL 95   BNP: Invalid input(s): POCBNP CBG:  Recent Labs Lab 09/26/15 1315 09/26/15 1813 09/26/15 2210 09/27/15 0755 09/27/15 1058  GLUCAP 241* 277* 268* 261* 269*    Recent Results (from the past 240 hour(s))  Blood Cultures x 2 sites     Status: None (Preliminary result)   Collection Time: 09/25/15  8:40 PM  Result Value Ref Range Status   Specimen Description BLOOD RIGHT FOREARM  Final   Special Requests BOTTLES DRAWN AEROBIC AND ANAEROBIC 5CC  Final   Culture NO GROWTH < 24 HOURS  Final   Report Status PENDING  Incomplete  MRSA PCR Screening     Status: None  Collection Time: 09/25/15  9:48 PM  Result Value Ref Range Status   MRSA by PCR NEGATIVE NEGATIVE Final    Comment:        The GeneXpert MRSA Assay (FDA approved for NASAL specimens only), is one component of a comprehensive MRSA colonization surveillance program. It is not intended to diagnose MRSA infection nor to guide or monitor treatment for MRSA infections.   Blood Cultures x 2 sites     Status: None (Preliminary result)   Collection Time: 09/25/15  9:55 PM  Result Value Ref Range Status   Specimen Description BLOOD LEFT HAND  Final   Special Requests BOTTLES DRAWN AEROBIC AND ANAEROBIC 5CC  Final   Culture NO GROWTH < 24 HOURS  Final   Report Status PENDING  Incomplete     Studies:              All Imaging reviewed and is as  per above notation   Scheduled Meds: . antiseptic oral rinse  7 mL Mouth Rinse BID  . aspirin EC  81 mg Oral Daily  . cefTAZidime (FORTAZ)  IV  2 g Intravenous 3 times per day  . divalproex  1,500 mg Oral QHS  . DULoxetine  30 mg Oral QHS  . famotidine (PEPCID) IV  20 mg Intravenous Q12H  . feeding supplement (PRO-STAT SUGAR FREE 64)  30 mL Oral BID  . ferrous sulfate  325 mg Oral Daily  . gabapentin  600 mg Oral TID  . heparin  5,000 Units Subcutaneous 3 times per day  . insulin aspart  0-9 Units Subcutaneous TID WC  . insulin aspart  3 Units Subcutaneous TID WC  . insulin glargine  25 Units Subcutaneous BID  . lisinopril  2.5 mg Oral Daily  . metronidazole  500 mg Intravenous Q8H  . mupirocin cream   Topical BID  . pneumococcal 23 valent vaccine  0.5 mL Intramuscular Tomorrow-1000  . rosuvastatin  5 mg Oral Daily  . vancomycin  750 mg Intravenous Q12H   Continuous Infusions: . sodium chloride 100 mL/hr at 09/26/15 0550     Assessment/Plan:   1. DKA-initial anion gap 16, bicarbonate 21-completely resolved now. See below 2. 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  CBG's 250 range Continue sliding scale insulin 3 times a day. Blood sugars in the 100 range. Continue gabapentin 600 3 times a day TRANSFER to telemetry 3. Probable diabetic foot ulcer/Osteo. Continue Fortaz, Flagyl, vancomycin. MRI of the foot has been ordered.  Orthopedics consulted for op management.  ABI and MRI still pending.  ESR elevated, White count however improved 4. Hyperlipidemia continue Crestor 5 mg daily 5. Bipolar-continue Depakote 1500 daily at bedtime, Cymbalta 30 daily 6. Obesity- Body mass index is 30.7 kg/(m^2). needs outpatient follow-up 7. Iron deficiency anemia-continue FeSO4 902 daily, folic acid 409 daily  Code Status: Full Family Communication: None at bedside Disposition Plan: inpatient   Verneita Griffes, MD  [facility] Pager [phone] 09/27/2015, 12:56 PM    LOS: 2 days

## 2015-09-27 NOTE — Progress Notes (Signed)
Inpatient Diabetes Program Recommendations  AACE/ADA: New Consensus Statement on Inpatient Glycemic Control (2015)  Target Ranges:  Prepandial:   less than 140 mg/dL      Peak postprandial:   less than 180 mg/dL (1-2 hours)      Critically ill patients:  140 - 180 mg/dL    Results for Caleb Joseph, Caleb Joseph (MRN [id number]) as of 09/27/2015 11:46  Ref. Range 09/26/2015 06:26 09/26/2015 09:10 09/26/2015 13:15 09/26/2015 18:13 09/26/2015 22:10  Glucose-Capillary Latest Ref Range: 65-99 mg/dL 110 (H) 169 (H) 241 (H) 277 (H) 268 (H)    Results for Caleb Joseph, Caleb Joseph (MRN [id number]) as of 09/27/2015 11:46  Ref. Range 09/27/2015 07:55 09/27/2015 10:58  Glucose-Capillary Latest Ref Range: 65-99 mg/dL 261 (H) 269 (H)     Home DM Meds: Toujeo insulin- 40 units QHS       Novolog 2-15 units tid per SSI       Metformin 1000 mg bid  Current Insulin Orders: Lantus 25 units bid      Novolog Sensitive SSI (0-9 units) TID AC      Novolog 3 units tidwc     MD- Please consider the following in-hospital insulin adjustments:  1. Increase Lantus to 28 units bid (approximate 10% increase)  2. Increase Novolog Meal Coverage to 5 units tid with meals     --Will follow patient during hospitalization--  Wyn Quaker RN, MSN, CDE Diabetes Coordinator Inpatient Glycemic Control Team Team Pager: [phone] (8a-5p)

## 2015-09-28 ENCOUNTER — Encounter (HOSPITAL_COMMUNITY)
Admission: EM | Disposition: A | Discharge status: Home / Self Care | Payer: Self-pay | Source: Home / Self Care | Attending: Family Medicine

## 2015-09-28 ENCOUNTER — Inpatient Hospital Stay (HOSPITAL_COMMUNITY): Payer: Medicare HMO | Admitting: Certified Registered Nurse Anesthetist

## 2015-09-28 ENCOUNTER — Encounter (HOSPITAL_COMMUNITY): Payer: Self-pay | Admitting: Certified Registered"

## 2015-09-28 ENCOUNTER — Other Ambulatory Visit: Payer: Self-pay

## 2015-09-28 DIAGNOSIS — E1165 Type 2 diabetes mellitus with hyperglycemia: Secondary | ICD-10-CM

## 2015-09-28 DIAGNOSIS — M869 Osteomyelitis, unspecified: Secondary | ICD-10-CM

## 2015-09-28 DIAGNOSIS — E1169 Type 2 diabetes mellitus with other specified complication: Secondary | ICD-10-CM

## 2015-09-28 DIAGNOSIS — E1142 Type 2 diabetes mellitus with diabetic polyneuropathy: Secondary | ICD-10-CM

## 2015-09-28 DIAGNOSIS — E1140 Type 2 diabetes mellitus with diabetic neuropathy, unspecified: Secondary | ICD-10-CM

## 2015-09-28 DIAGNOSIS — Z794 Long term (current) use of insulin: Secondary | ICD-10-CM

## 2015-09-28 HISTORY — PX: I & D EXTREMITY: SHX5045

## 2015-09-28 LAB — BASIC METABOLIC PANEL
Anion gap: 11 (ref 5–15)
BUN: 16 mg/dL (ref 6–20)
CHLORIDE: 101 mmol/L (ref 101–111)
CO2: 26 mmol/L (ref 22–32)
Calcium: 9.1 mg/dL (ref 8.9–10.3)
Creatinine, Ser: 0.98 mg/dL (ref 0.61–1.24)
GFR calc Af Amer: >60 mL/min (ref >60)
GFR calc non Af Amer: >60 mL/min (ref >60)
GLUCOSE: 73 mg/dL (ref 65–99)
POTASSIUM: 4.5 mmol/L (ref 3.5–5.1)
Sodium: 138 mmol/L (ref 135–145)

## 2015-09-28 LAB — VANCOMYCIN, TROUGH: Vancomycin Tr: 24 ug/mL — ABNORMAL HIGH (ref 10.0–20.0)

## 2015-09-28 LAB — GLUCOSE, CAPILLARY
GLUCOSE-CAPILLARY: 150 mg/dL — AB (ref 65–99)
Glucose-Capillary: 78 mg/dL (ref 65–99)
Glucose-Capillary: 102 mg/dL — ABNORMAL HIGH (ref 65–99)

## 2015-09-28 SURGERY — IRRIGATION AND DEBRIDEMENT EXTREMITY
Anesthesia: General | Site: Foot | Laterality: Left

## 2015-09-28 MED ORDER — LACTATED RINGERS IV SOLN
INTRAVENOUS | Status: DC | PRN
  Administered 2015-09-28: 20:00:00 via INTRAVENOUS

## 2015-09-28 MED ORDER — MIDAZOLAM HCL 5 MG/5ML IJ SOLN
INTRAMUSCULAR | Status: DC | PRN
  Administered 2015-09-28: 2 mg via INTRAVENOUS

## 2015-09-28 MED ORDER — SODIUM CHLORIDE 0.9 % IJ SOLN
INTRAMUSCULAR | Status: AC
  Filled 2015-09-28: qty 10

## 2015-09-28 MED ORDER — ONDANSETRON HCL 4 MG/2ML IJ SOLN
INTRAMUSCULAR | Status: AC
  Filled 2015-09-28: qty 2

## 2015-09-28 MED ORDER — INSULIN GLARGINE 100 UNIT/ML ~~LOC~~ SOLN
30.0000 [IU] | Freq: Two times a day (BID) | SUBCUTANEOUS | Status: DC
  Administered 2015-09-29 (×2): 30 [IU] via SUBCUTANEOUS
  Filled 2015-09-28 (×3): qty 0.3

## 2015-09-28 MED ORDER — INSULIN GLARGINE 100 UNIT/ML ~~LOC~~ SOLN
15.0000 [IU] | Freq: Once | SUBCUTANEOUS | Status: DC
  Filled 2015-09-28 (×2): qty 0.15

## 2015-09-28 MED ORDER — MEPERIDINE HCL 25 MG/ML IJ SOLN: 6.2500 mg | INTRAMUSCULAR | Status: DC | PRN

## 2015-09-28 MED ORDER — DEXTROSE 50 % IV SOLN
INTRAVENOUS | Status: AC
Start: 2015-09-28 — End: 2015-09-28
  Administered 2015-09-28: 12.5 g via INTRAVENOUS
  Filled 2015-09-28: qty 50

## 2015-09-28 MED ORDER — LIDOCAINE HCL (CARDIAC) 20 MG/ML IV SOLN
INTRAVENOUS | Status: AC
  Filled 2015-09-28: qty 5

## 2015-09-28 MED ORDER — MIDAZOLAM HCL 2 MG/2ML IJ SOLN
INTRAMUSCULAR | Status: AC
  Filled 2015-09-28: qty 4

## 2015-09-28 MED ORDER — VANCOMYCIN HCL IN DEXTROSE 1-5 GM/200ML-% IV SOLN
1000.0000 mg | INTRAVENOUS | Status: DC
  Administered 2015-09-29: 1000 mg via INTRAVENOUS
  Filled 2015-09-28: qty 200

## 2015-09-28 MED ORDER — SUFENTANIL CITRATE 50 MCG/ML IV SOLN
INTRAVENOUS | Status: DC | PRN
  Administered 2015-09-28: 10 ug via INTRAVENOUS

## 2015-09-28 MED ORDER — SODIUM CHLORIDE 0.9 % IR SOLN
Status: DC | PRN
  Administered 2015-09-28: 1000 mL

## 2015-09-28 MED ORDER — LIDOCAINE HCL (CARDIAC) 20 MG/ML IV SOLN
INTRAVENOUS | Status: DC | PRN
  Administered 2015-09-28: 100 mg via INTRAVENOUS

## 2015-09-28 MED ORDER — HYDROMORPHONE HCL 1 MG/ML IJ SOLN: 1.0000 mg | INTRAMUSCULAR | Status: DC | PRN

## 2015-09-28 MED ORDER — ONDANSETRON HCL 4 MG/2ML IJ SOLN: 4.0000 mg | Freq: Once | INTRAMUSCULAR | Status: DC | PRN

## 2015-09-28 MED ORDER — SUFENTANIL CITRATE 50 MCG/ML IV SOLN
INTRAVENOUS | Status: AC
Start: 2015-09-28 — End: 2015-09-28
  Filled 2015-09-28: qty 1

## 2015-09-28 MED ORDER — INSULIN GLARGINE 100 UNIT/ML ~~LOC~~ SOLN: 10.0000 [IU] | Freq: Once | SUBCUTANEOUS | Status: DC

## 2015-09-28 MED ORDER — HYDROMORPHONE HCL 1 MG/ML IJ SOLN: 0.2500 mg | INTRAMUSCULAR | Status: DC | PRN

## 2015-09-28 MED ORDER — ONDANSETRON HCL 4 MG/2ML IJ SOLN
INTRAMUSCULAR | Status: DC | PRN
  Administered 2015-09-28: 4 mg via INTRAVENOUS

## 2015-09-28 MED ORDER — OXYCODONE HCL 5 MG PO TABS
5.0000 mg | ORAL_TABLET | ORAL | Status: DC | PRN
  Administered 2015-09-29: 5 mg via ORAL
  Filled 2015-09-28: qty 1

## 2015-09-28 MED ORDER — PROPOFOL 10 MG/ML IV BOLUS
INTRAVENOUS | Status: DC | PRN
  Administered 2015-09-28: 150 mg via INTRAVENOUS

## 2015-09-28 SURGICAL SUPPLY — 60 items
BANDAGE ELASTIC 3 VELCRO ST LF (GAUZE/BANDAGES/DRESSINGS) IMPLANT
BLADE SAW SGTL MED 73X18.5 STR (BLADE) ×3 IMPLANT
BLADE SURG 10 STRL SS (BLADE) ×3 IMPLANT
BNDG COHESIVE 1X5 TAN STRL LF (GAUZE/BANDAGES/DRESSINGS) IMPLANT
BNDG COHESIVE 4X5 TAN STRL (GAUZE/BANDAGES/DRESSINGS) ×3 IMPLANT
BNDG COHESIVE 6X5 TAN STRL LF (GAUZE/BANDAGES/DRESSINGS) ×6 IMPLANT
BNDG CONFORM 3 STRL LF (GAUZE/BANDAGES/DRESSINGS) IMPLANT
BNDG GAUZE ELAST 4 BULKY (GAUZE/BANDAGES/DRESSINGS) ×3 IMPLANT
BNDG GAUZE STRTCH 6 (GAUZE/BANDAGES/DRESSINGS) ×9 IMPLANT
CORDS BIPOLAR (ELECTRODE) IMPLANT
COVER SURGICAL LIGHT HANDLE (MISCELLANEOUS) ×3 IMPLANT
CUFF TOURNIQUET SINGLE 18IN (TOURNIQUET CUFF) ×3 IMPLANT
CUFF TOURNIQUET SINGLE 24IN (TOURNIQUET CUFF) IMPLANT
CUFF TOURNIQUET SINGLE 34IN LL (TOURNIQUET CUFF) IMPLANT
CUFF TOURNIQUET SINGLE 44IN (TOURNIQUET CUFF) IMPLANT
DRAPE ORTHO SPLIT 77X108 STRL (DRAPES) ×6
DRAPE SURG 17X23 STRL (DRAPES) IMPLANT
DRAPE SURG ORHT 6 SPLT 77X108 (DRAPES) ×2 IMPLANT
DRAPE U-SHAPE 47X51 STRL (DRAPES) ×3 IMPLANT
DURAPREP 26ML APPLICATOR (WOUND CARE) ×3 IMPLANT
ELECT CAUTERY BLADE 6.4 (BLADE) IMPLANT
ELECT REM PT RETURN 9FT ADLT (ELECTROSURGICAL)
ELECTRODE REM PT RTRN 9FT ADLT (ELECTROSURGICAL) IMPLANT
GAUZE SPONGE 4X4 12PLY STRL (GAUZE/BANDAGES/DRESSINGS) ×3 IMPLANT
GAUZE XEROFORM 1X8 LF (GAUZE/BANDAGES/DRESSINGS) ×3 IMPLANT
GAUZE XEROFORM 5X9 LF (GAUZE/BANDAGES/DRESSINGS) ×3 IMPLANT
GLOVE BIO SURGEON STRL SZ8 (GLOVE) ×3 IMPLANT
GLOVE BIOGEL PI IND STRL 8 (GLOVE) ×2 IMPLANT
GLOVE BIOGEL PI INDICATOR 8 (GLOVE) ×4
GLOVE ORTHO TXT STRL SZ7.5 (GLOVE) ×3 IMPLANT
GOWN STRL REUS W/ TWL LRG LVL3 (GOWN DISPOSABLE) ×1 IMPLANT
GOWN STRL REUS W/ TWL XL LVL3 (GOWN DISPOSABLE) ×4 IMPLANT
GOWN STRL REUS W/TWL LRG LVL3 (GOWN DISPOSABLE) ×3
GOWN STRL REUS W/TWL XL LVL3 (GOWN DISPOSABLE) ×12
HANDPIECE INTERPULSE COAX TIP (DISPOSABLE)
KIT BASIN OR (CUSTOM PROCEDURE TRAY) ×3 IMPLANT
KIT ROOM TURNOVER OR (KITS) ×3 IMPLANT
MANIFOLD NEPTUNE II (INSTRUMENTS) ×3 IMPLANT
NS IRRIG 1000ML POUR BTL (IV SOLUTION) ×3 IMPLANT
PACK ORTHO EXTREMITY (CUSTOM PROCEDURE TRAY) ×3 IMPLANT
PAD ABD 8X10 STRL (GAUZE/BANDAGES/DRESSINGS) ×3 IMPLANT
PAD ARMBOARD 7.5X6 YLW CONV (MISCELLANEOUS) ×6 IMPLANT
PADDING CAST ABS 4INX4YD NS (CAST SUPPLIES) ×4
PADDING CAST ABS COTTON 4X4 ST (CAST SUPPLIES) ×2 IMPLANT
PADDING CAST COTTON 6X4 STRL (CAST SUPPLIES) ×3 IMPLANT
SET HNDPC FAN SPRY TIP SCT (DISPOSABLE) IMPLANT
SPONGE LAP 18X18 X RAY DECT (DISPOSABLE) ×3 IMPLANT
STOCKINETTE IMPERVIOUS 9X36 MD (GAUZE/BANDAGES/DRESSINGS) ×3 IMPLANT
SUT ETHILON 2 0 FS 18 (SUTURE) ×15 IMPLANT
SUT ETHILON 3 0 PS 1 (SUTURE) ×6 IMPLANT
SYR CONTROL 10ML LL (SYRINGE) IMPLANT
TOWEL OR 17X24 6PK STRL BLUE (TOWEL DISPOSABLE) ×3 IMPLANT
TOWEL OR 17X26 10 PK STRL BLUE (TOWEL DISPOSABLE) ×3 IMPLANT
TUBE ANAEROBIC SPECIMEN COL (MISCELLANEOUS) IMPLANT
TUBE CONNECTING 12'X1/4 (SUCTIONS) ×1
TUBE CONNECTING 12X1/4 (SUCTIONS) ×2 IMPLANT
TUBE FEEDING 5FR 15 INCH (TUBING) IMPLANT
UNDERPAD 30X30 INCONTINENT (UNDERPADS AND DIAPERS) ×3 IMPLANT
WATER STERILE IRR 1000ML POUR (IV SOLUTION) ×3 IMPLANT
YANKAUER SUCT BULB TIP NO VENT (SUCTIONS) ×3 IMPLANT

## 2015-09-28 NOTE — Care Management Important Message (Signed)
Important Message  Patient Details  Name: Caleb Joseph MRN: [id number] Date of Birth: 1956/07/03   Medicare Important Message Given:  Yes-second notification given    Nathen May 09/28/2015, 11:45 AM

## 2015-09-28 NOTE — Brief Op Note (Signed)
09/25/2015 - 09/28/2015  8:54 PM  PATIENT:  [name]  59 y.o. male  PRE-OPERATIVE DIAGNOSIS:  INFECTION  POST-OPERATIVE DIAGNOSIS:  INFECTION  PROCEDURE:  Procedure(s): IRRIGATION AND DRAINAGE OF LEFT FOOT with second and third metatarsal amputation (Left)  SURGEON:  Surgeon(s) and Role:    * Mcarthur Rossetti, MD - Primary  PHYSICIAN ASSISTANT: Benita Stabile, PA-C  ANESTHESIA:   general  EBL:   < 100 cc  BLOOD ADMINISTERED:none  DRAINS: none   LOCAL MEDICATIONS USED:  NONE  SPECIMEN:  No Specimen  DISPOSITION OF SPECIMEN:  N/A  COUNTS:  YES  TOURNIQUET:  * No tourniquets in log *  DICTATION: .Other Dictation: Dictation Number [id number]  PLAN OF CARE: Admit to inpatient   PATIENT DISPOSITION:  PACU - hemodynamically stable.   Delay start of Pharmacological VTE agent (>24hrs) due to surgical blood loss or risk of bleeding: no

## 2015-09-28 NOTE — Progress Notes (Signed)
Inpatient Diabetes Program Recommendations  AACE/ADA: New Consensus Statement on Inpatient Glycemic Control (2015)  Target Ranges:  Prepandial:   less than 140 mg/dL      Peak postprandial:   less than 180 mg/dL (1-2 hours)      Critically ill patients:  140 - 180 mg/dL    Results for DAMANTE, SPRAGG (MRN [id number]) as of 09/28/2015 08:47  Ref. Range 09/27/2015 07:55 09/27/2015 10:58 09/27/2015 16:52 09/27/2015 21:29  Glucose-Capillary Latest Ref Range: 65-99 mg/dL 261 (H) 269 (H) 239 (H) 181 (H)    Home DM Meds: Toujeo insulin- 40 units QHS  Novolog 2-15 units tid per SSI  Metformin 1000 mg bid  Current Insulin Orders: Lantus 40 units bid  Novolog Sensitive SSI (0-9 units) TID AC  Novolog 3 units tidwc       MD- Concern that Lantus dose was increased to 40 units bid yesterday afternoon.    Patient does not take that much basal insulin at home.  Per records, patient takes Toujeo 40 units QHS at home.  Concern that this dose of Lantus 40 units bid may be too much.  Note AM lab glucose 73 mg/dl this AM.  Patient received a total of 65 units Lantus yesterday (11/01).   MD- Please consider reducing Lantus to 30 units bid  If patient continues to have elevated postprandial glucose levels, please consider starting Novolog Meal Coverage- Novolog 4 units tid with meals     --Will follow patient during hospitalization--  Wyn Quaker RN, MSN, CDE Diabetes Coordinator Inpatient Glycemic Control Team Team Pager: [phone] (8a-5p)

## 2015-09-28 NOTE — Progress Notes (Signed)
ANTIBIOTIC CONSULT NOTE  Pharmacy Consult for vancomycin/fortaz Indication: cellulitis  No Known Allergies  Patient Measurements: Height: 6' 3.2" (191 cm) Weight: 246 lb 14.6 oz (112 kg) IBW/kg (Calculated) : 84.95  Vital Signs: Temp: 98.5 F (36.9 C) (11/02 0626) Temp Source: Oral (11/02 0243) BP: 118/74 mmHg (11/02 0626) Pulse Rate: 82 (11/02 0626) Intake/Output from previous day: 11/01 0701 - 11/02 0700 In: 5784 [P.O.:657; IV Piggyback:900] Out: 2825 [Urine:2825] Intake/Output from this shift: Total I/O In: 240 [P.O.:240] Out: -   Labs:  Recent Labs  09/25/15 1733  09/25/15 2155 09/26/15 0122 09/26/15 0406 09/28/15 0546  WBC 11.6*  --  9.8  --   --   --   HGB 13.2  --  12.2*  --   --   --   PLT 156  --  152  --   --   --   CREATININE 1.33*  < >  --  0.96 0.93 0.98  < > = values in this interval not displayed. Estimated Creatinine Clearance: 110 mL/min (by C-G formula based on Cr of 0.98).  Recent Labs  09/28/15 0921  VANCOTROUGH 24*     Microbiology: Recent Results (from the past 720 hour(s))  Blood Cultures x 2 sites     Status: None (Preliminary result)   Collection Time: 09/25/15  8:40 PM  Result Value Ref Range Status   Specimen Description BLOOD RIGHT FOREARM  Final   Special Requests BOTTLES DRAWN AEROBIC AND ANAEROBIC 5CC  Final   Culture NO GROWTH 2 DAYS  Final   Report Status PENDING  Incomplete  MRSA PCR Screening     Status: None   Collection Time: 09/25/15  9:48 PM  Result Value Ref Range Status   MRSA by PCR NEGATIVE NEGATIVE Final    Comment:        The GeneXpert MRSA Assay (FDA approved for NASAL specimens only), is one component of a comprehensive MRSA colonization surveillance program. It is not intended to diagnose MRSA infection nor to guide or monitor treatment for MRSA infections.   Blood Cultures x 2 sites     Status: None (Preliminary result)   Collection Time: 09/25/15  9:55 PM  Result Value Ref Range Status   Specimen Description BLOOD LEFT HAND  Final   Special Requests BOTTLES DRAWN AEROBIC AND ANAEROBIC 5CC  Final   Culture NO GROWTH 2 DAYS  Final   Report Status PENDING  Incomplete    Medical History: Past Medical History  Diagnosis Date  . Diabetes mellitus   . 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 HYPERLIPIDEMIA 08/26/2007  . Diabetic foot infection ([place]) 12/26/2011  . Hypertension   . Abscess of prostate 02/17/2009   Assessment: 59 year old male presents to [facility] with recurrent foot cellulitis.   Afeb, wbc 11.6, glucose 658, scr 1.33. New orders to start IV fortaz and vancomycin for cellulitis. High suspicion for MRSA.  Pt will be going to surgery today. VT was drawn just before next dose and is supratherapeutic at 24 mcg/mL on vancomycin 750mg  q12h. Will reduce dose. Pt is afebrile, no new CBC, sCr 0.98.  Goal of Therapy:  Vancomycin trough level 10-15 mcg/ml  Plan:  Vancomycin 1g IV q24h Fortaz 2g IV q8 hours Measure antibiotic drug levels at steady state Follow up culture results  Andrey Cota. Diona Foley, PharmD Clinical Pharmacist Pager [phone]  09/28/2015 10:20 AM

## 2015-09-28 NOTE — Anesthesia Procedure Notes (Signed)
Procedure Name: LMA Insertion Date/Time: 09/28/2015 8:15 PM Performed by: Claris Che Pre-anesthesia Checklist: Patient identified, Emergency Drugs available, Suction available, Patient being monitored and Timeout performed Patient Re-evaluated:Patient Re-evaluated prior to inductionOxygen Delivery Method: Circle system utilized Preoxygenation: Pre-oxygenation with 100% oxygen Intubation Type: IV induction Ventilation: Mask ventilation without difficulty LMA: LMA inserted LMA Size: 4.0 Number of attempts: 1

## 2015-09-28 NOTE — Progress Notes (Signed)
Patient ID: Caleb Joseph, male   DOB: [birth year], 59 y.o.   MRN: [id number] [name] understands that we are proceeding to surgery this evening for an irrigation and debridement of his left foot infection.

## 2015-09-28 NOTE — Progress Notes (Signed)
[facility] PROGRESS NOTE  Caleb Joseph FIE:[id number] DOB: Jul 23, 1956 DOA: 09/25/2015  PCP: Chesley Noon, MD  Brief HPI: 59 year old Caucasian male with a past medical history of diabetes, presented with generalized weakness, nausea and vomiting. He was found to be in mild DKA with an infected appearing left foot. Patient was hospitalized for further management.  Past medical history:  Past Medical History  Diagnosis Date  . Diabetes mellitus   . 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 HYPERLIPIDEMIA 08/26/2007  . Diabetic foot infection ([place]) 12/26/2011  . Hypertension   . Abscess of prostate 02/17/2009    Consultants: Orthopedics  Procedures: Plan is for debridement and amputation left second toe today  Antibiotics: Vancomycin, metronidazole and Fortaz  Subjective: Patient denies any complaints. Pain is well controlled. No nausea or vomiting.  Objective: Vital Signs  Filed Vitals:   09/27/15 1501 09/27/15 2307 09/28/15 0243 09/28/15 0626  BP: 146/92 137/81 124/70 118/74  Pulse: 92 84 79 82  Temp:  98.4 F (36.9 C) 98.3 F (36.8 C) 98.5 F (36.9 C)  TempSrc:  Oral Oral   Resp: 18 17 16 18   Height:      Weight:      SpO2: 77% 100% 100% 100%    Intake/Output Summary (Last 24 hours) at 09/28/15 0911 Last data filed at 09/28/15 0800  Gross per 24 hour  Intake   1797 ml  Output   2425 ml  Net   -628 ml   Filed Weights   09/25/15 1900  Weight: 112 kg (246 lb 14.6 oz)    General appearance: alert, cooperative, appears stated age and no distress Resp: clear to auscultation bilaterally Cardio: regular rate and rhythm, S1, S2 normal, no murmur, click, rub or gallop GI: soft, non-tender; bowel sounds normal; no masses,  no organomegaly Left foot in a dressing. Neurologic: Alert and oriented 3. No focal neurological deficits are noted.  Lab Results:  Basic Metabolic Panel:  Recent  Labs Lab 09/25/15 1733 09/25/15 2040 09/26/15 0122 09/26/15 0406 09/28/15 0546  NA 124* 130* 135 135 138  K 4.7 4.6 4.5 4.0 4.5  CL 87* 90* 99* 101 101  CO2 21* 22 21* 24 26  GLUCOSE 658* 366* 240* 187* 73  BUN 17 18 16 15 16   CREATININE 1.33* 1.28* 0.96 0.93 0.98  CALCIUM 9.5 9.7 9.3 9.0 9.1   CBC:  Recent Labs Lab 09/25/15 1733 09/25/15 2155  WBC 11.6* 9.8  NEUTROABS  --  6.6  HGB 13.2 12.2*  HCT 38.8* 35.8*  MCV 88.8 88.0  PLT 156 152   Cardiac Enzymes:  Recent Labs Lab 09/25/15 2040  CKTOTAL 95    CBG:  Recent Labs Lab 09/27/15 0755 09/27/15 1058 09/27/15 1652 09/27/15 2129 09/28/15 0759  GLUCAP 261* 269* 239* 181* 78    Recent Results (from the past 240 hour(s))  Blood Cultures x 2 sites     Status: None (Preliminary result)   Collection Time: 09/25/15  8:40 PM  Result Value Ref Range Status   Specimen Description BLOOD RIGHT FOREARM  Final   Special Requests BOTTLES DRAWN AEROBIC AND ANAEROBIC 5CC  Final   Culture NO GROWTH 2 DAYS  Final   Report Status PENDING  Incomplete  MRSA PCR Screening     Status: None   Collection Time: 09/25/15  9:48 PM  Result Value Ref Range Status   MRSA by PCR NEGATIVE NEGATIVE Final    Comment:  The GeneXpert MRSA Assay (FDA approved for NASAL specimens only), is one component of a comprehensive MRSA colonization surveillance program. It is not intended to diagnose MRSA infection nor to guide or monitor treatment for MRSA infections.   Blood Cultures x 2 sites     Status: None (Preliminary result)   Collection Time: 09/25/15  9:55 PM  Result Value Ref Range Status   Specimen Description BLOOD LEFT HAND  Final   Special Requests BOTTLES DRAWN AEROBIC AND ANAEROBIC 5CC  Final   Culture NO GROWTH 2 DAYS  Final   Report Status PENDING  Incomplete      Studies/Results: Mr Foot Left Wo Contrast  09/28/2015  CLINICAL DATA:  Osteomyelitis. EXAM: MRI OF THE LEFT FOREFOOT WITHOUT CONTRAST TECHNIQUE:  Multiplanar, multisequence MR imaging was performed. No intravenous contrast was administered. COMPARISON:  Radiographs dated 09/25/2015 and 03/13/2012 FINDINGS: There is osteomyelitis of the distal second metatarsal with edema extending at the shaft almost to the base. There is a subtle osteomyelitis of the base of the proximal phalanx of the second toe. There is a septic joint with an abscess that extends anteriorly onto the dorsum of the forefoot. The abscess measures approximately 3 x 2.5 x 1.5 cm. The adjacent metatarsals are normal. IMPRESSION: Osteomyelitis of the distal second metatarsal and of the proximal phalanx of the second toe. Septic second metatarsal phalangeal joint. Abscess extending onto the dorsum of the foot from the joint. Electronically Signed   By: Lorriane Shire M.D.   On: 09/28/2015 08:34    Medications:  Scheduled: . antiseptic oral rinse  7 mL Mouth Rinse BID  . aspirin EC  81 mg Oral Daily  . cefTAZidime (FORTAZ)  IV  2 g Intravenous 3 times per day  . divalproex  1,500 mg Oral QHS  . DULoxetine  30 mg Oral QHS  . famotidine  20 mg Oral BID  . feeding supplement (PRO-STAT SUGAR FREE 64)  30 mL Oral BID  . ferrous sulfate  325 mg Oral Daily  . gabapentin  600 mg Oral TID  . heparin  5,000 Units Subcutaneous 3 times per day  . insulin aspart  0-9 Units Subcutaneous TID WC  . insulin aspart  3 Units Subcutaneous TID WC  . insulin glargine  15 Units Subcutaneous Once  . insulin glargine  30 Units Subcutaneous BID  . lisinopril  2.5 mg Oral Daily  . metronidazole  500 mg Intravenous Q8H  . mupirocin cream   Topical BID  . rosuvastatin  5 mg Oral Daily  . [START ON 09/29/2015] vancomycin  1,000 mg Intravenous Q24H   Continuous: . 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 At the time of hospitalization. Patient's blood work is improved. Blood sugars are well controlled. CBG was actually in the 70s this morning. Dose of Lantus has been reduced. HbA1c 11.4. Continue gabapentin for neuropathy.  Left diabetic foot with osteomyelitis involving the second toe and second metatarsal Seen by orthopedics. Plan is for amputation later today. Continue broad-spectrum antibiotic coverage for now. Blood cultures are negative so far.  Hyperlipidemia, clinically undetermined Continue Crestor.  History of bipolar  disorder Continue Depakote and Cymbalta. He is clinically stable.  History of iron deficiency anemia Stable. Continue medications.  DVT Prophylaxis: Subcutaneous heparin    Code Status: Full code  Family Communication: Discussed with the patient  Disposition Plan: Await surgery.    LOS: 3 days   [facility] Pager [phone] 09/28/2015, 9:11 AM  If 7PM-7AM, please contact night-coverage at [email or web address], password [facility]

## 2015-09-28 NOTE — Progress Notes (Signed)
Report called into OR nurse.

## 2015-09-28 NOTE — Progress Notes (Signed)
Pharmacy did not send his mepirocin after several attempt made to paged

## 2015-09-28 NOTE — Progress Notes (Signed)
Patient arrived back to 5w19 alert and oriented coband to left foot.  No reports of any pain will continue to monitor

## 2015-09-28 NOTE — Anesthesia Preprocedure Evaluation (Addendum)
Anesthesia Evaluation  Patient identified by MRN, date of birth, ID band Patient awake    Reviewed: Allergy & Precautions, NPO status , Patient's Chart, lab work & pertinent test results  Airway Mallampati: I  TM Distance: >3 FB Neck ROM: Full    Dental   Pulmonary Current Smoker,    Pulmonary exam normal        Cardiovascular hypertension, Pt. on medications Normal cardiovascular exam     Neuro/Psych Depression    GI/Hepatic   Endo/Other  diabetes, Type 2, Insulin Dependent, Oral Hypoglycemic Agents  Renal/GU      Musculoskeletal   Abdominal   Peds  Hematology   Anesthesia Other Findings   Reproductive/Obstetrics                            Anesthesia Physical Anesthesia Plan  ASA: III  Anesthesia Plan: General   Post-op Pain Management:    Induction: Intravenous  Airway Management Planned: LMA  Additional Equipment:   Intra-op Plan:   Post-operative Plan: Extubation in OR  Informed Consent: I have reviewed the patients History and Physical, chart, labs and discussed the procedure including the risks, benefits and alternatives for the proposed anesthesia with the patient or authorized representative who has indicated his/her understanding and acceptance.     Plan Discussed with: CRNA and Surgeon  Anesthesia Plan Comments:        Anesthesia Quick Evaluation

## 2015-09-28 NOTE — Transfer of Care (Signed)
Immediate Anesthesia Transfer of Care Note  Patient: Caleb Joseph  Procedure(s) Performed: Procedure(s): IRRIGATION AND DRAINAGE OF LEFT FOOT with second and third metatarsal amputation (Left)  Patient Location: PACU  Anesthesia Type:General  Level of Consciousness: oriented, sedated, patient cooperative and responds to stimulation  Airway & Oxygen Therapy: Patient Spontanous Breathing and Patient connected to nasal cannula oxygen  Post-op Assessment: Report given to RN, Post -op Vital signs reviewed and stable and Patient moving all extremities X 4  Post vital signs: Reviewed and stable  Last Vitals:  Filed Vitals:   09/28/15 1415  BP: 131/74  Pulse: 86  Temp: 36.9 C  Resp: 18    Complications: No apparent anesthesia complications

## 2015-09-28 NOTE — Care Management Note (Signed)
Case Management Note  Patient Details  Name: Caleb Joseph MRN: [id number] Date of Birth: 28-Dec-1955  Subjective/Objective:                Date-09-28-15 Initial Assessment, patient transferred from [facility]. Spoke with patient at the bedside.  Introduced self as Tourist information centre manager and explained role in discharge planning and how to be reached.  Verified patient lives alone.  Verified patient anticipates to go home alone,  at time of discharge. Patient has DME cane. Expressed potential need for no other DME.  Patient denied  needing help with their medication.  Patient drives to MD appointments.  Plan: CM will continue to follow for discharge planning and [facility] resources.   Carles Collet RN BSN CM [phone]     Action/Plan:   Expected Discharge Date:                  Expected Discharge Plan:  Home/Self Care  In-House Referral:     Discharge planning Services  CM Consult  Post Acute Care Choice:    Choice offered to:     DME Arranged:    DME Agency:     HH Arranged:    HH Agency:     Status of Service:  In process, will continue to follow  Medicare Important Message Given:  Yes-second notification given Date Medicare IM Given:    Medicare IM give by:    Date Additional Medicare IM Given:    Additional Medicare Important Message give by:     If discussed at [place] of Stay Meetings, dates discussed:    Additional Comments:  Carles Collet, RN 09/28/2015, 12:27 PM

## 2015-09-29 ENCOUNTER — Encounter (HOSPITAL_COMMUNITY): Payer: Self-pay | Admitting: Orthopaedic Surgery

## 2015-09-29 LAB — GLUCOSE, CAPILLARY
GLUCOSE-CAPILLARY: 256 mg/dL — AB (ref 65–99)
Glucose-Capillary: 80 mg/dL (ref 65–99)
Glucose-Capillary: 124 mg/dL — ABNORMAL HIGH (ref 65–99)
Glucose-Capillary: 127 mg/dL — ABNORMAL HIGH (ref 65–99)

## 2015-09-29 LAB — BASIC METABOLIC PANEL
Anion gap: 8 (ref 5–15)
BUN: 13 mg/dL (ref 6–20)
CHLORIDE: 103 mmol/L (ref 101–111)
CO2: 26 mmol/L (ref 22–32)
Calcium: 8.6 mg/dL — ABNORMAL LOW (ref 8.9–10.3)
Creatinine, Ser: 0.91 mg/dL (ref 0.61–1.24)
GFR calc Af Amer: >60 mL/min (ref >60)
GLUCOSE: 152 mg/dL — AB (ref 65–99)
POTASSIUM: 4.4 mmol/L (ref 3.5–5.1)
Sodium: 137 mmol/L (ref 135–145)

## 2015-09-29 LAB — CBC
HEMATOCRIT: 33.5 % — AB (ref 39.0–52.0)
Hemoglobin: 11.4 g/dL — ABNORMAL LOW (ref 13.0–17.0)
MCH: 31 pg (ref 26.0–34.0)
MCHC: 34 g/dL (ref 30.0–36.0)
MCV: 91 fL (ref 78.0–100.0)
PLATELETS: 131 10*3/uL — AB (ref 150–400)
RBC: 3.68 MIL/uL — AB (ref 4.22–5.81)
RDW: 12.9 % (ref 11.5–15.5)
WBC: 6.9 10*3/uL (ref 4.0–10.5)

## 2015-09-29 MED ORDER — OXYCODONE HCL 5 MG PO TABS: 5.0000 mg | ORAL_TABLET | ORAL | Status: DC | PRN

## 2015-09-29 NOTE — Progress Notes (Signed)
Nsg Discharge Note  Admit Date:  09/25/2015 Discharge date: 09/29/2015   Caleb Joseph to be D/C'd Home per MD order.  AVS completed.  Copy for chart, and copy for patient signed, and dated. Patient/caregiver able to verbalize understanding.  Discharge Medication:   Medication List    TAKE these medications        albuterol 108 (90 BASE) MCG/ACT inhaler  Commonly known as:  PROVENTIL HFA;VENTOLIN HFA  Inhale 2 puffs into the lungs every 6 (six) hours as needed. For cold symptoms     aspirin 81 MG tablet  Take 1 tablet (81 mg total) by mouth daily.     cholecalciferol 1000 UNITS tablet  Commonly known as:  VITAMIN D  Take 1,000 Units by mouth daily.     divalproex 500 MG DR tablet  Commonly known as:  DEPAKOTE  Take 1,500 mg by mouth at bedtime.     DULoxetine 30 MG capsule  Commonly known as:  CYMBALTA  Take 1 capsule (30 mg total) by mouth at bedtime.     gabapentin 600 MG tablet  Commonly known as:  NEURONTIN  Take 600 mg by mouth 2 (two) times daily.     insulin aspart 100 UNIT/ML injection  Commonly known as:  novoLOG  Inject 2-15 Units into the skin 3 (three) times daily before meals. CBG < 70: implement hypoglycemia protocol  CBG 70 - 120: 0 units  CBG 121 - 150: 2 units  CBG 151 - 200: 3 units  CBG 201 - 250: 5 units  CBG 251 - 300: 7 units  CBG 301 - 350: 10 units  CBG 351 - 400: 12 units CBG >400:  15 Units     Iron 325 (65 FE) MG Tabs  Take 1 tablet by mouth daily.     lisinopril 2.5 MG tablet  Commonly known as:  PRINIVIL,ZESTRIL  Take 2.5 mg by mouth daily.     Lutein 10 MG Tabs  Take 10 mg by mouth daily.     metFORMIN 1000 MG tablet  Commonly known as:  GLUCOPHAGE  Take 1 tablet (1,000 mg total) by mouth 2 (two) times daily with a meal.     omega-3 acid ethyl esters 1 G capsule  Commonly known as:  LOVAZA  Take 2 capsules (2 g total) by mouth 2 (two) times daily.     oxyCODONE 5 MG immediate release tablet  Commonly known as:  Oxy IR/ROXICODONE   Take 1 tablet (5 mg total) by mouth every 3 (three) hours as needed for breakthrough pain.     rosuvastatin 5 MG tablet  Commonly known as:  CRESTOR  Take 1 tablet (5 mg total) by mouth daily.     TOUJEO SOLOSTAR 300 UNIT/ML Sopn  Generic drug:  Insulin Glargine  Inject 40 Units into the skin at bedtime.        Discharge Assessment: Filed Vitals:   09/29/15 0613  BP: 133/73  Pulse: 83  Temp: 98.6 F (37 C)  Resp: 18   Skin clean, dry and intact without evidence of skin break down, no evidence of skin tears noted. Does have cobain over L foot where 2nd and 3rd metatarsal was amputated. IV catheter discontinued intact. Site without signs and symptoms of complications - no redness or edema noted at insertion site, patient denies c/o pain - only slight tenderness at site.  Dressing with slight pressure applied.  D/c Instructions-Education: Discharge instructions given to patient/family with verbalized understanding. D/c education  completed with patient/family including follow up instructions, medication list, d/c activities limitations if indicated, with other d/c instructions as indicated by MD - patient able to verbalize understanding, all questions fully answered. Patient instructed to return to ED, call 911, or call MD for any changes in condition.  Patient escorted via [place], and D/C home via private auto.  Caleb Delcid Margaretha Sheffield, RN 09/29/2015 11:42 AM

## 2015-09-29 NOTE — Anesthesia Postprocedure Evaluation (Signed)
Anesthesia Post Note  Patient: Caleb Joseph  Procedure(s) Performed: Procedure(s) (LRB): IRRIGATION AND DRAINAGE OF LEFT FOOT with second and third metatarsal amputation (Left)  Anesthesia type: general  Patient location: PACU  Post pain: Pain level controlled  Post assessment: Patient's Cardiovascular Status Stable  Last Vitals:  Filed Vitals:   09/28/15 2147  BP: 145/83  Pulse: 72  Temp: 36.3 C  Resp: 16    Post vital signs: Reviewed and stable  Level of consciousness: sedated  Complications: No apparent anesthesia complications

## 2015-09-29 NOTE — Discharge Summary (Signed)
[facility]  Physician Discharge Summary   Patient ID: Caleb Joseph MRN: [id number] DOB/AGE: [birth year] 59 y.o.  Admit date: 09/25/2015 Discharge date: 09/29/2015  PCP: Chesley Noon, MD  DISCHARGE DIAGNOSES:  Principal Problem:   DKA (diabetic ketoacidoses) ([place]) Active Problems:   Diabetic neuropathy ([place])   Dyslipidemia   Essential hypertension   PVD   Diabetic osteomyelitis ([place])   Hyponatremia   Lactic acidosis   RECOMMENDATIONS FOR OUTPATIENT FOLLOW UP: 1. Patient to follow-up with orthopedics as instructed.   DISCHARGE CONDITION: fair  Diet recommendation: Modified carbohydrate  Filed Weights   09/25/15 1900  Weight: 112 kg (246 lb 14.6 oz)    INITIAL HISTORY: 59 year old Caucasian male with a past medical history of diabetes, presented with generalized weakness, nausea and vomiting. He was found to be in mild DKA with an infected appearing left foot. Patient was hospitalized for further management.  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 Patient's blood work is improved. Blood sugars are well controlled. HbA1c 11.4. Continue gabapentin for neuropathy. He may continue his home medication regimen. He will benefit from following up with his primary care physician for better control of his diabetes.  Left diabetic foot with osteomyelitis involving the second toe and second metatarsal Patient was seen by orthopedics. Patient underwent left foot irrigation and debridement with second and third ray amputations along with sharp exertional debridement of the infected tissue. He was initially placed on broad-spectrum antibiotics. Discussed with Dr. Ninfa Linden today. He states that he has removed all the infected region. He does not feel that the patient needs to be on  any oral antibiotics. He will follow the patient closely in the outpatient setting.   Hyperlipidemia, clinically undetermined Continue Crestor.  History of bipolar disorder Continue Depakote and Cymbalta. He is clinically stable.  History of iron deficiency anemia Stable. Continue medications.  Patient is stable. He feels better. He wants to go home. Okay for discharge today.   PERTINENT LABS:  The results of significant diagnostics from this hospitalization (including imaging, microbiology, ancillary and laboratory) are listed below for reference.    Microbiology: Recent Results (from the past 240 hour(s))  Blood Cultures x 2 sites     Status: None (Preliminary result)   Collection Time: 09/25/15  8:40 PM  Result Value Ref Range Status   Specimen Description BLOOD RIGHT FOREARM  Final   Special Requests BOTTLES DRAWN AEROBIC AND ANAEROBIC 5CC  Final   Culture NO GROWTH 4 DAYS  Final   Report Status PENDING  Incomplete  MRSA PCR Screening     Status: None   Collection Time: 09/25/15  9:48 PM  Result Value Ref Range Status   MRSA by PCR NEGATIVE NEGATIVE Final    Comment:        The GeneXpert MRSA Assay (FDA approved for NASAL specimens only), is one component of a comprehensive MRSA colonization surveillance program. It is not intended to diagnose MRSA infection nor to guide or monitor treatment for MRSA infections.   Blood Cultures x 2 sites     Status: None (Preliminary result)   Collection Time: 09/25/15  9:55 PM  Result Value Ref Range Status   Specimen Description BLOOD LEFT HAND  Final   Special Requests BOTTLES DRAWN AEROBIC AND ANAEROBIC 5CC  Final   Culture NO GROWTH 4 DAYS  Final   Report Status PENDING  Incomplete  Culture, routine-abscess     Status: None (Preliminary result)   Collection Time: 09/28/15  8:26 PM  Result Value Ref Range Status   Specimen Description ABSCESS LEFT FOOT  Final   Special Requests NONE  Final   Gram Stain   Final    RARE  WBC PRESENT, PREDOMINANTLY PMN NO SQUAMOUS EPITHELIAL CELLS SEEN NO ORGANISMS SEEN Performed at [facility]    Culture PENDING  Incomplete   Report Status PENDING  Incomplete     Labs: Basic Metabolic Panel:  Recent Labs Lab 09/25/15 2040 09/26/15 0122 09/26/15 0406 09/28/15 0546 09/29/15 0520  NA 130* 135 135 138 137  K 4.6 4.5 4.0 4.5 4.4  CL 90* 99* 101 101 103  CO2 22 21* 24 26 26   GLUCOSE 366* 240* 187* 73 152*  BUN 18 16 15 16 13   CREATININE 1.28* 0.96 0.93 0.98 0.91  CALCIUM 9.7 9.3 9.0 9.1 8.6*   CBC:  Recent Labs Lab 09/25/15 1733 09/25/15 2155 09/29/15 0520  WBC 11.6* 9.8 6.9  NEUTROABS  --  6.6  --   HGB 13.2 12.2* 11.4*  HCT 38.8* 35.8* 33.5*  MCV 88.8 88.0 91.0  PLT 156 152 131*   Cardiac Enzymes:  Recent Labs Lab 09/25/15 2040  CKTOTAL 95    CBG:  Recent Labs Lab 09/28/15 1700 09/28/15 2002 09/28/15 2059 09/29/15 0754 09/29/15 1035  GLUCAP 102* 80 124* 127* 256*     IMAGING STUDIES Mr Foot Left Wo Contrast  09/28/2015  CLINICAL DATA:  Osteomyelitis. EXAM: MRI OF THE LEFT FOREFOOT WITHOUT CONTRAST TECHNIQUE: Multiplanar, multisequence MR imaging was performed. No intravenous contrast was administered. COMPARISON:  Radiographs dated 09/25/2015 and 03/13/2012 FINDINGS: There is osteomyelitis of the distal second metatarsal with edema extending at the shaft almost to the base. There is a subtle osteomyelitis of the base of the proximal phalanx of the second toe. There is a septic joint with an abscess that extends anteriorly onto the dorsum of the forefoot. The abscess measures approximately 3 x 2.5 x 1.5 cm. The adjacent metatarsals are normal. IMPRESSION: Osteomyelitis of the distal second metatarsal and of the proximal phalanx of the second toe. Septic second metatarsal phalangeal joint. Abscess extending onto the dorsum of the foot from the joint. Electronically Signed   By: Lorriane Shire M.D.   On: 09/28/2015 08:34   Dg Foot  Complete Left  09/25/2015  CLINICAL DATA:  Evaluate for osteomyelitis. Ulceration at the left foot. Initial encounter. EXAM: LEFT FOOT - COMPLETE 3+ VIEW COMPARISON:  None. FINDINGS: There appears to be osseous erosion at the distal second and third metatarsals, with associated dorsal and medial dislocation at the second metatarsophalangeal joint, and shortening at the site of dislocation. 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 No additional osseous erosions are characterized. Overlying soft tissue swelling is noted at the forefoot. Plantar and posterior calcaneal spurs are noted. Scattered vascular calcifications are seen. Visualized joint spaces are otherwise unremarkable. An os peroneum is noted. IMPRESSION: Osseous erosion at the distal second and third metatarsals, concerning for osteomyelitis, with associated dorsal and medial dislocation at the second metatarsophalangeal joint, and shortening at the site of dislocation. Electronically Signed   By: Garald Balding M.D.   On: 09/25/2015 19:09    DISCHARGE EXAMINATION: Filed Vitals:   09/28/15 2128 09/28/15 2130 09/28/15 2147 09/29/15 0613  BP: 139/83  145/83 133/73  Pulse: 76 76 72 83  Temp:  66 F (36.1 C)  97.4 F (36.3 C) 98.6 F (37 C)  TempSrc:   Oral Oral  Resp: 16 17 16 18   Height:      Weight:      SpO2: 97% 99% 100% 100%   General appearance: alert, cooperative, appears stated age and no distress Resp: clear to auscultation bilaterally Cardio: regular rate and rhythm, S1, S2 normal, no murmur, click, rub or gallop GI: soft, non-tender; bowel sounds normal; no masses,  no organomegaly Extremities: extremities normal, atraumatic, no cyanosis or edema Neurologic: Alert and oriented 3. No focal neurological deficits are noted.  DISPOSITION: Home  Discharge Instructions    Call MD for:  difficulty breathing, headache or visual disturbances    Complete by:  As directed      Call MD for:  extreme fatigue    Complete by:   As directed      Call MD for:  persistant dizziness or light-headedness    Complete by:  As directed      Call MD for:  persistant nausea and vomiting    Complete by:  As directed      Call MD for:  redness, tenderness, or signs of infection (pain, swelling, redness, odor or green/yellow discharge around incision site)    Complete by:  As directed      Call MD for:  severe uncontrolled pain    Complete by:  As directed      Call MD for:  temperature >100.4    Complete by:  As directed      Diet Carb Modified    Complete by:  As directed      Discharge instructions    Complete by:  As directed   Please follow up with Dr. Ninfa Linden as instructed. Seek attention if you develop fever/chills or worsening pain in the foot.  You were cared for by a hospitalist during your hospital stay. If you have any questions about your discharge medications or the care you received while you were in the hospital after you are discharged, you can call the unit and asked to speak with the hospitalist on call if the hospitalist that took care of you is not available. Once you are discharged, your primary care physician will handle any further medical issues. Please note that NO REFILLS for any discharge medications will be authorized once you are discharged, as it is imperative that you return to your primary care physician (or establish a relationship with a primary care physician if you do not have one) for your aftercare needs so that they can reassess your need for medications and monitor your lab values. If you do not have a primary care physician, you can call [phone] for a physician referral.     Increase activity slowly    Complete by:  As directed            ALLERGIES: No Known Allergies   Discharge Medication List as of 09/29/2015 11:45 AM    START taking these medications   Details  oxyCODONE (OXY IR/ROXICODONE) 5 MG immediate release tablet Take 1 tablet (5 mg total) by mouth every 3 (three)  hours as needed for breakthrough pain., Starting 09/29/2015, Until Discontinued, Print      CONTINUE these medications which have NOT CHANGED   Details  albuterol (PROVENTIL HFA;VENTOLIN HFA) 108 (90 BASE) MCG/ACT inhaler Inhale 2 puffs into the lungs every 6 (six) hours as needed. For cold symptoms, Starting 12/16/2012, Until Discontinued, Print  aspirin 81 MG tablet Take 1 tablet (81 mg total) by mouth daily., Starting 03/17/2013, Until Discontinued, OTC    cholecalciferol (VITAMIN D) 1000 UNITS tablet Take 1,000 Units by mouth daily., Starting 06/23/2015, Until Discontinued, Historical Med    divalproex (DEPAKOTE) 500 MG DR tablet Take 1,500 mg by mouth at bedtime., Starting 08/23/2015, Until Discontinued, Historical Med    DULoxetine (CYMBALTA) 30 MG capsule Take 1 capsule (30 mg total) by mouth at bedtime., Starting 12/16/2012, Until Discontinued, Print    Ferrous Sulfate (IRON) 325 (65 FE) MG TABS Take 1 tablet by mouth daily., Until Discontinued, Historical Med    gabapentin (NEURONTIN) 600 MG tablet Take 600 mg by mouth 2 (two) times daily. , Until Discontinued, Historical Med    insulin aspart (NOVOLOG) 100 UNIT/ML injection Inject 2-15 Units into the skin 3 (three) times daily before meals. CBG < 70: implement hypoglycemia protocol  CBG 70 - 120: 0 units  CBG 121 - 150: 2 units  CBG 151 - 200: 3 units  CBG 201 - 250: 5 units  CBG 251 - 300: 7 units  CBG 301 - 350: 10  units  CBG 351 - 400: 12 units CBG >400:  15 Units, Starting 09/03/2013, Until Discontinued, Print    Insulin Glargine (TOUJEO SOLOSTAR) 300 UNIT/ML SOPN Inject 40 Units into the skin at bedtime., Until Discontinued, Historical Med    lisinopril (PRINIVIL,ZESTRIL) 2.5 MG tablet Take 2.5 mg by mouth daily., Until Discontinued, Historical Med    Lutein 10 MG TABS Take 10 mg by mouth daily., Until Discontinued, Historical Med    metFORMIN (GLUCOPHAGE) 1000 MG tablet Take 1 tablet (1,000 mg total) by mouth 2 (two)  times daily with a meal., Starting 04/22/2013, Until Discontinued, Print    omega-3 acid ethyl esters (LOVAZA) 1 G capsule Take 2 capsules (2 g total) by mouth 2 (two) times daily., Starting 09/14/2013, Until Discontinued, Print    rosuvastatin (CRESTOR) 5 MG tablet Take 1 tablet (5 mg total) by mouth daily., Starting 03/17/2013, Until Discontinued, Print       Follow-up Information    Follow up with Mcarthur Rossetti, MD. Schedule an appointment as soon as possible for a visit on 10/05/2015.   Specialty:  Orthopedic Surgery   Why:  Appointment with Dr. Ninfa Linden is on 10/05/15 at 09:15am   Contact information:   [place] [address] [phone]       Schedule an appointment as soon as possible for a visit with Chesley Noon, MD.   Specialty:  Family Medicine   Why:  As needed   Contact information:   [address] [place] [address] [phone]       TOTAL DISCHARGE TIME: 35 minutes  [facility] Pager [phone]  09/29/2015, 3:24 PM

## 2015-09-29 NOTE — Op Note (Signed)
NAMEMarland Kitchen  PEDRO, WHITERS NO.:  [id number]  MEDICAL RECORD NO.:  [id number]  LOCATION:  5W19C                        FACILITY:  [facility]  PHYSICIAN:  Lind Guest. Ninfa Linden, M.D.DATE OF BIRTH:  27-Feb-1956  DATE OF PROCEDURE:  09/28/2015 DATE OF DISCHARGE:  09/29/2015                              OPERATIVE REPORT   PREOPERATIVE DIAGNOSIS:  Left forefoot infection with osteomyelitis of the second and third rays.  PROCEDURE:  Left foot irrigation and debridement with second and third ray amputations and sharp excisional debridement of infected skin, soft tissue, fascia, and bone.  SURGEON:  Jean Rosenthal, MD.  ASSISTING:  Erskine Emery, PA-C.  ANESTHESIA:  General.  BLOOD LOSS:  Less than 50 mL.  COMPLICATIONS:  None.  INDICATIONS:  Mr. Wadlow is a 59 year old gentleman who was recently admitted with diabetic ketoacidosis.  He has had chronic left foot problems for some time and has had actually a great toe and a third toe amputation.  He presents with the middle aspect of his forefoot and has x-ray and MRI evidence of osteomyelitis involving the second and third metatarsals.  I have explained this to him, and I have recommended I and D of his irrigation and debridement of his left foot with second and third ray resections with amputations to the mid metatarsals on both the second and third rays.  He does agree to proceed with surgery.  PROCEDURE DESCRIPTION:  After informed consent was obtained, appropriate left foot was marked.  He was brought to the operative room, placed supine on the operating table.  General anesthesia was then obtained. His left foot was prepped and draped with Betadine scrub and paint, and sterile drapes were applied.  A time-out was called to identify correct patient, correct left foot.  We then used the Esmarch around the ankle as a local tourniquet and then made a wide incision in the middle of his foot between the second and  third rays.  We were able to use a sharp #10 blade to ellipse out necrotic soft tissue, fascia, and get down to the bone.  We removed the second toe in its entirety.  The third toe had already been removed.  Then, we were able to expose both second and third metatarsals and find more to the proximal metatarsals where there was good solid bone.  Using an oscillating saw, we then made our bony resection cuts to the second and third metatarsals.  We then irrigated the soft tissue with normal saline solution and latter Esmarch local tourniquet down at the ankle, and we got a good bleeding tissue.  We then reapproximated the skin with interrupted 2-0 nylon suture.  Xeroform and a well-padded sterile dressing was applied.  He was awakened, extubated, and taken to recovery room in stable condition.  All final counts were correct.  There were no complications noted.  Of note, Erskine Emery, PA- C assisted in the entire case.     Lind Guest. Ninfa Linden, M.D.     CYB/MEDQ  D:  09/28/2015  T:  09/29/2015  Job:  [id number]

## 2015-09-29 NOTE — Progress Notes (Signed)
Patient ID: Caleb Joseph, male   DOB: February 12, 1956, 59 y.o.   MRN: [id number] Surgery went well last evening.  Infected 2nd and 3rd metatarsals were removed and I found good bleeding tissue.  Dressing is clean this am.  From an Ortho standpoint, he can be discharged to home with follow-up iin one week.  He does not need antibiotics from my standpoint.

## 2015-09-29 NOTE — Discharge Instructions (Signed)
Leave your current left foot dressing in place and keep it clean and dry. Make an appointment to be seen at [facility] next week. You can put weight on your foot wearing your boot or post-op shoe.

## 2015-09-30 ENCOUNTER — Telehealth: Payer: Self-pay | Admitting: *Deleted

## 2015-09-30 LAB — CULTURE, BLOOD (ROUTINE X 2)
Culture: NO GROWTH
Culture: NO GROWTH

## 2015-09-30 NOTE — Telephone Encounter (Signed)
Pt states spent Sunday to yesterday for I and D of left foot.  Pt states he wanted Dr. Prudence Davidson to know, and that he had to cancel the MRI because he was in the hospital.  Pt asked if Dr. Prudence Davidson wanted him to be on the Doxycycline again.  I told pt since we had not seen him since his surgery the surgeon Dr. Jean Rosenthal would be best to answer the antibiotic question.  Pt states understanding.

## 2015-10-02 LAB — CULTURE, ROUTINE-ABSCESS: CULTURE: NO GROWTH

## 2015-10-03 LAB — ANAEROBIC CULTURE

## 2016-01-04 ENCOUNTER — Ambulatory Visit: Payer: Medicare HMO | Admitting: Podiatry

## 2016-01-05 ENCOUNTER — Ambulatory Visit (INDEPENDENT_AMBULATORY_CARE_PROVIDER_SITE_OTHER): Payer: Medicare HMO | Admitting: Podiatry

## 2016-01-05 ENCOUNTER — Encounter: Payer: Self-pay | Admitting: Podiatry

## 2016-01-05 VITALS — BP 139/71 | HR 77 | Resp 14

## 2016-01-05 DIAGNOSIS — E0849 Diabetes mellitus due to underlying condition with other diabetic neurological complication: Secondary | ICD-10-CM

## 2016-01-05 DIAGNOSIS — L84 Corns and callosities: Secondary | ICD-10-CM

## 2016-01-05 DIAGNOSIS — M79676 Pain in unspecified toe(s): Secondary | ICD-10-CM

## 2016-01-05 DIAGNOSIS — B351 Tinea unguium: Secondary | ICD-10-CM

## 2016-01-05 NOTE — Progress Notes (Addendum)
Subjective:     Patient ID: EMORY OHAYON, male   DOB: 1956/11/25, 60 y.o.   MRN: [id number]  HPIThis patient returns to the office saying his previous diabetic ulcer is burning during gait.  He says this burning  Indicates the callus at the ulcer site needs to be debrided.  He also has callus on back of right heel.  This is his first visit to the office since having partial foot amputation left foot. He presents for evaluation and treatment.   Review of Systems     Objective:   Physical Exam GENERAL APPEARANCE: Alert, conversant. Appropriately groomed. No acute distress.  VASCULAR: Pedal pulses palpable at  [facility] and PT bilateral.  Capillary refill time is immediate to all digits,  Normal temperature gradient.   NEUROLOGIC: sensation is absent  to 5.07 monofilament at 5/5 sites bilateral.  Light touch is intact bilateral, Muscle strength normal.  MUSCULOSKELETAL: acceptable muscle strength, tone and stability bilateral.  Intrinsic muscluature intact bilateral.  Rectus appearance of foot and digits noted bilateral. Amputation 1,2,3 left digits.  Excision 3rd metatarsal head left foot.  Amputation 5th ray right foot and distal phalanx IPJ right foot.  DERMATOLOGIC: skin color, texture, and turgor are within normal limits.  No preulcerative lesions or ulcers  are seen, no interdigital maceration noted.  No open lesions present.  . No drainage noted. Callus sub 5th metabase right foot .  No evidence of ulcerations or infections both feet.  Callus noted retrocalcaneally right foot.   NAILS  Thick disfigured discolored nails on five toes both feet.      Assessment:     Onychomycosis  Callus sub 5th metabase right foot    Plan:     Debride Nails.  Debride callus right foot.  RTC 10 weeks.   Gardiner Barefoot DPM

## 2016-03-15 ENCOUNTER — Ambulatory Visit (INDEPENDENT_AMBULATORY_CARE_PROVIDER_SITE_OTHER): Payer: Medicare HMO | Admitting: Podiatry

## 2016-03-15 ENCOUNTER — Encounter: Payer: Self-pay | Admitting: Podiatry

## 2016-03-15 DIAGNOSIS — M79676 Pain in unspecified toe(s): Secondary | ICD-10-CM | POA: Diagnosis not present

## 2016-03-15 DIAGNOSIS — E11621 Type 2 diabetes mellitus with foot ulcer: Secondary | ICD-10-CM

## 2016-03-15 DIAGNOSIS — B351 Tinea unguium: Secondary | ICD-10-CM

## 2016-03-15 DIAGNOSIS — E1151 Type 2 diabetes mellitus with diabetic peripheral angiopathy without gangrene: Secondary | ICD-10-CM

## 2016-03-15 DIAGNOSIS — L97529 Non-pressure chronic ulcer of other part of left foot with unspecified severity: Secondary | ICD-10-CM

## 2016-03-15 NOTE — Progress Notes (Signed)
Subjective:     Patient ID: Caleb Joseph, male   DOB: 10/02/1956, 60 y.o.   MRN: [id number]  HPIThis patient returns to the office for evaluation of his diabetic feet.Marland Kitchen  He says this burning  Indicates the callus at the ulcer site needs to be debrided.  He also has callus on back of his right heel.Marland Kitchen He presents for evaluation and treatment.   Review of Systems     Objective:   Physical Exam GENERAL APPEARANCE: Alert, conversant. Appropriately groomed. No acute distress.  VASCULAR: Pedal pulses palpable at  [facility] and PT bilateral.  Capillary refill time is immediate to all digits,  Normal temperature gradient.   NEUROLOGIC: sensation is absent  to 5.07 monofilament at 5/5 sites bilateral.  Light touch is intact bilateral, Muscle strength normal.  MUSCULOSKELETAL: acceptable muscle strength, tone and stability bilateral.  Intrinsic muscluature intact bilateral.  Rectus appearance of foot and digits noted bilateral. Amputation 1,2,3 left digits.  Excision 3rd metatarsal head left foot.  Amputation 5th ray right foot and distal phalanx IPJ right foot.  DERMATOLOGIC: skin color, texture, and turgor are within normal limits.  No preulcerative lesions or ulcers  are seen, no interdigital maceration noted.  No open lesions present.  . No drainage noted. Callus sub 5th metabase right foot .  No evidence of ulcerations or infections both feet.  Callus noted retrocalcaneally right foot.   NAILS  Thick disfigured discolored nails on five toes both feet.      Assessment:     Onychomycosis  Callus sub 5th metabase right foot    Plan:     Debride Nails.  Debride callus right foot.  RTC 10 weeks.   Gardiner Barefoot DPM

## 2016-06-14 ENCOUNTER — Ambulatory Visit (INDEPENDENT_AMBULATORY_CARE_PROVIDER_SITE_OTHER): Payer: Medicare HMO | Admitting: Podiatry

## 2016-06-14 ENCOUNTER — Encounter: Payer: Self-pay | Admitting: Podiatry

## 2016-06-14 DIAGNOSIS — E0849 Diabetes mellitus due to underlying condition with other diabetic neurological complication: Secondary | ICD-10-CM

## 2016-06-14 DIAGNOSIS — E11621 Type 2 diabetes mellitus with foot ulcer: Secondary | ICD-10-CM

## 2016-06-14 DIAGNOSIS — L89891 Pressure ulcer of other site, stage 1: Secondary | ICD-10-CM | POA: Diagnosis not present

## 2016-06-14 DIAGNOSIS — E1151 Type 2 diabetes mellitus with diabetic peripheral angiopathy without gangrene: Secondary | ICD-10-CM

## 2016-06-14 DIAGNOSIS — L03011 Cellulitis of right finger: Secondary | ICD-10-CM

## 2016-06-14 DIAGNOSIS — L97501 Non-pressure chronic ulcer of other part of unspecified foot limited to breakdown of skin: Principal | ICD-10-CM

## 2016-06-14 NOTE — Progress Notes (Signed)
Subjective:     Patient ID: Caleb Joseph, male   DOB: June 15, 1956, 60 y.o.   MRN: [id number]  HPI this patient presents to the office with a chief complaint of a newly formed ulcer on the top of his right foot. He states as he was getting out of the shower this morning he noticed that there was an ulcer that has developed near his big toe area. He states that there is no pain or discomfort noted from that site. No drainage is noted from that side also. He does have redness, swelling and granulation tissue noted on the inside of the third toe, right foot. Finally, he has a persistent callus noted on the back of his right foot. He states that the calluses painful as he walks and wears his shoes. He presents the office today for an evaluation and treatment of his diabetic feet. He has been diagnosed with diabetes with angiopathy as well as neuropathy.  Patient relates his his sugar is 220 today.     Review of Systems     Objective:   Physical Exam GENERAL APPEARANCE: Alert, conversant. Appropriately groomed. No acute distress.  VASCULAR: Pedal pulses are  palpable at  [facility] and PT bilateral.  Capillary refill time is immediate to all digits,  Normal temperature gradient.  Digital hair growth is present bilateral  NEUROLOGIC: sensation is absent to 5.07 monofilament at 5/5 sites bilateral.  Light touch is intact bilateral, Muscle strength normal.  MUSCULOSKELETAL: acceptable muscle strength, tone and stability bilateral.  Intrinsic muscluature intact bilateral.  Rectus appearance of foot and digits noted bilateral. Amputation of the first, second and third digits, left foot excision of the third metatarsal left foot. Patient of the fifth ray of the right foot and distal phalanx IPJ of the right foot  DERMATOLOGIC: Callus sub-fifth meta-base, right foot and retro-calcaneally  right foot. There is a 10 x 10 mm ulcer that has developed at the head of the first metatarsal right foot. No evidence of any redness,  swelling or drainage noted. This ulcer is covered by necrotic tissue and is down to the level of the subcutaneous tissue only NAILS  redness, swelling noted along the medial aspect of the third toe, right foot. This is consistent with a paronychia     Assessment:     Paronychia right foot  Ulcer right foot.     Plan:     ROV>  Debridement of callus.  Incision and drainage third toe right foot.  Debridement of ulcer with neosporin/DSD applied.  Told him to take antibiotics which he has at home.  Home soaking and bandaging instructions noted.  RTC 9 weeks unless the dorsal ulcer right foot worsens. If this condition worsens or becomes very painful, the patient was told to contact this office or go to the Emergency Department at the hospital.  Gardiner Barefoot DPM

## 2016-08-16 ENCOUNTER — Ambulatory Visit (INDEPENDENT_AMBULATORY_CARE_PROVIDER_SITE_OTHER): Payer: Medicare HMO | Admitting: Podiatry

## 2016-08-16 ENCOUNTER — Encounter: Payer: Self-pay | Admitting: Podiatry

## 2016-08-16 VITALS — BP 125/77 | HR 84 | Resp 14

## 2016-08-16 DIAGNOSIS — E11621 Type 2 diabetes mellitus with foot ulcer: Secondary | ICD-10-CM

## 2016-08-16 DIAGNOSIS — M79676 Pain in unspecified toe(s): Secondary | ICD-10-CM

## 2016-08-16 DIAGNOSIS — B351 Tinea unguium: Secondary | ICD-10-CM

## 2016-08-16 DIAGNOSIS — E0849 Diabetes mellitus due to underlying condition with other diabetic neurological complication: Secondary | ICD-10-CM

## 2016-08-16 DIAGNOSIS — L84 Corns and callosities: Secondary | ICD-10-CM

## 2016-08-16 DIAGNOSIS — L97501 Non-pressure chronic ulcer of other part of unspecified foot limited to breakdown of skin: Secondary | ICD-10-CM

## 2016-08-16 NOTE — Progress Notes (Signed)
Subjective:     Patient ID: Caleb Joseph, male   DOB: 1956/01/16, 60 y.o.   MRN: [id number]  HPIThis patient returns to the office for evaluation of his diabetic feet.Marland Kitchen  He says this burning  Indicates the callus at the ulcer site needs to be debrided at the base fifth metatarsal right foot.  Patient has callus formation of both forefeet.  He has long painful nails on his remaining toes.  He also has callus on back of his right heel.Marland Kitchen He presents for evaluation and treatment.   Review of Systems     Objective:   Physical Exam GENERAL APPEARANCE: Alert, conversant. Appropriately groomed. No acute distress.  VASCULAR: Pedal pulses palpable at  [facility] and PT bilateral.  Capillary refill time is immediate to all digits,  Normal temperature gradient.   NEUROLOGIC: sensation is absent  to 5.07 monofilament at 5/5 sites bilateral.  Light touch is intact bilateral, Muscle strength normal.  MUSCULOSKELETAL: acceptable muscle strength, tone and stability bilateral.  Intrinsic muscluature intact bilateral.  Rectus appearance of foot and digits noted bilateral. Amputation 1,2,3 left digits.  Excision 3rd metatarsal head left foot.  Amputation 5th ray right foot and distal phalanx IPJ right foot.  DERMATOLOGIC: skin color, texture, and turgor are within normal limits.  No preulcerative lesions or ulcers  are seen, no interdigital maceration noted.  No open lesions present.  . No drainage noted. Callus sub 5th metabase right foot .  No evidence of ulcerations or infections both feet.  Callus noted retrocalcaneally right foot.  Callus sub 1 B/L. NAILS  Thick disfigured discolored nails on five toes both feet.      Assessment:     Onychomycosis  Callus sub 5th metabase right foot    Plan:     Debride Nails.  Debride callus right foot.  RTC 10 weeks.   Gardiner Barefoot DPM

## 2016-08-30 ENCOUNTER — Telehealth: Payer: Self-pay | Admitting: Podiatry

## 2016-08-30 NOTE — Telephone Encounter (Signed)
Left message for patient to schedule an appointment with [facility] to be measured for Diabetic shoes. °

## 2016-10-05 ENCOUNTER — Telehealth: Payer: Self-pay | Admitting: Podiatry

## 2016-10-05 NOTE — Telephone Encounter (Signed)
lvm for pt to call to get scheduled for Diabetic shoe measurements at the [facility] location

## 2016-10-12 ENCOUNTER — Emergency Department (HOSPITAL_COMMUNITY): Payer: Medicare HMO

## 2016-10-12 ENCOUNTER — Inpatient Hospital Stay (HOSPITAL_COMMUNITY)
Admission: EM | Admit: 2016-10-12 | Discharge: 2016-10-14 | DRG: 071 | Disposition: A | Discharge status: Home Health | Payer: Medicare HMO | Attending: Internal Medicine | Admitting: Internal Medicine

## 2016-10-12 ENCOUNTER — Encounter (HOSPITAL_COMMUNITY): Payer: Self-pay | Admitting: *Deleted

## 2016-10-12 DIAGNOSIS — Z89422 Acquired absence of other left toe(s): Secondary | ICD-10-CM | POA: Diagnosis not present

## 2016-10-12 DIAGNOSIS — E860 Dehydration: Secondary | ICD-10-CM | POA: Diagnosis present

## 2016-10-12 DIAGNOSIS — N179 Acute kidney failure, unspecified: Secondary | ICD-10-CM | POA: Diagnosis present

## 2016-10-12 DIAGNOSIS — F4310 Post-traumatic stress disorder, unspecified: Secondary | ICD-10-CM | POA: Diagnosis present

## 2016-10-12 DIAGNOSIS — Z79899 Other long term (current) drug therapy: Secondary | ICD-10-CM | POA: Diagnosis not present

## 2016-10-12 DIAGNOSIS — G9340 Encephalopathy, unspecified: Secondary | ICD-10-CM

## 2016-10-12 DIAGNOSIS — F319 Bipolar disorder, unspecified: Secondary | ICD-10-CM | POA: Diagnosis present

## 2016-10-12 DIAGNOSIS — E1142 Type 2 diabetes mellitus with diabetic polyneuropathy: Secondary | ICD-10-CM | POA: Diagnosis present

## 2016-10-12 DIAGNOSIS — Z89421 Acquired absence of other right toe(s): Secondary | ICD-10-CM | POA: Diagnosis not present

## 2016-10-12 DIAGNOSIS — R739 Hyperglycemia, unspecified: Secondary | ICD-10-CM

## 2016-10-12 DIAGNOSIS — E119 Type 2 diabetes mellitus without complications: Secondary | ICD-10-CM

## 2016-10-12 DIAGNOSIS — F17293 Nicotine dependence, other tobacco product, with withdrawal: Secondary | ICD-10-CM | POA: Diagnosis present

## 2016-10-12 DIAGNOSIS — Z7982 Long term (current) use of aspirin: Secondary | ICD-10-CM

## 2016-10-12 DIAGNOSIS — E1165 Type 2 diabetes mellitus with hyperglycemia: Secondary | ICD-10-CM | POA: Diagnosis present

## 2016-10-12 DIAGNOSIS — R29703 NIHSS score 3: Secondary | ICD-10-CM | POA: Diagnosis present

## 2016-10-12 DIAGNOSIS — E1151 Type 2 diabetes mellitus with diabetic peripheral angiopathy without gangrene: Secondary | ICD-10-CM | POA: Diagnosis present

## 2016-10-12 DIAGNOSIS — E872 Acidosis: Secondary | ICD-10-CM

## 2016-10-12 DIAGNOSIS — R471 Dysarthria and anarthria: Secondary | ICD-10-CM | POA: Diagnosis present

## 2016-10-12 DIAGNOSIS — G9341 Metabolic encephalopathy: Secondary | ICD-10-CM | POA: Diagnosis present

## 2016-10-12 DIAGNOSIS — R251 Tremor, unspecified: Secondary | ICD-10-CM | POA: Diagnosis not present

## 2016-10-12 DIAGNOSIS — I10 Essential (primary) hypertension: Secondary | ICD-10-CM | POA: Diagnosis present

## 2016-10-12 DIAGNOSIS — R4701 Aphasia: Secondary | ICD-10-CM | POA: Diagnosis present

## 2016-10-12 DIAGNOSIS — R4182 Altered mental status, unspecified: Secondary | ICD-10-CM | POA: Insufficient documentation

## 2016-10-12 DIAGNOSIS — E1140 Type 2 diabetes mellitus with diabetic neuropathy, unspecified: Secondary | ICD-10-CM | POA: Diagnosis present

## 2016-10-12 DIAGNOSIS — E785 Hyperlipidemia, unspecified: Secondary | ICD-10-CM

## 2016-10-12 DIAGNOSIS — Z794 Long term (current) use of insulin: Secondary | ICD-10-CM | POA: Diagnosis not present

## 2016-10-12 DIAGNOSIS — I679 Cerebrovascular disease, unspecified: Secondary | ICD-10-CM

## 2016-10-12 DIAGNOSIS — R29810 Facial weakness: Secondary | ICD-10-CM | POA: Diagnosis present

## 2016-10-12 DIAGNOSIS — Z89412 Acquired absence of left great toe: Secondary | ICD-10-CM | POA: Diagnosis not present

## 2016-10-12 LAB — LACTIC ACID, PLASMA
LACTIC ACID, VENOUS: 2.7 mmol/L — AB (ref 0.5–1.9)
Lactic Acid, Venous: 2.1 mmol/L (ref 0.5–1.9)

## 2016-10-12 LAB — I-STAT CHEM 8, ED
BUN: 28 mg/dL — AB (ref 6–20)
CALCIUM ION: 1.17 mmol/L (ref 1.15–1.40)
CHLORIDE: 94 mmol/L — AB (ref 101–111)
CREATININE: 1 mg/dL (ref 0.61–1.24)
GLUCOSE: 549 mg/dL — AB (ref 65–99)
HCT: 48 % (ref 39.0–52.0)
Hemoglobin: 16.3 g/dL (ref 13.0–17.0)
POTASSIUM: 5.1 mmol/L (ref 3.5–5.1)
SODIUM: 131 mmol/L — AB (ref 135–145)
TCO2: 24 mmol/L (ref 0–100)

## 2016-10-12 LAB — COMPREHENSIVE METABOLIC PANEL
ALK PHOS: 63 U/L (ref 38–126)
ALT: 20 U/L (ref 17–63)
AST: 30 U/L (ref 15–41)
Albumin: 4.7 g/dL (ref 3.5–5.0)
Anion gap: 16 — ABNORMAL HIGH (ref 5–15)
BUN: 22 mg/dL — AB (ref 6–20)
CHLORIDE: 94 mmol/L — AB (ref 101–111)
CO2: 20 mmol/L — AB (ref 22–32)
CREATININE: 1.33 mg/dL — AB (ref 0.61–1.24)
Calcium: 9.9 mg/dL (ref 8.9–10.3)
GFR calc Af Amer: >60 mL/min (ref >60)
GFR, EST NON AFRICAN AMERICAN: 57 mL/min — AB (ref >60)
Glucose, Bld: 543 mg/dL (ref 65–99)
Potassium: 5.1 mmol/L (ref 3.5–5.1)
SODIUM: 130 mmol/L — AB (ref 135–145)
Total Bilirubin: 1.4 mg/dL — ABNORMAL HIGH (ref 0.3–1.2)
Total Protein: 7.9 g/dL (ref 6.5–8.1)

## 2016-10-12 LAB — I-STAT VENOUS BLOOD GAS, ED
Acid-Base Excess: 3 mmol/L — ABNORMAL HIGH (ref 0.0–2.0)
Bicarbonate: 27.8 mmol/L (ref 20.0–28.0)
O2 SAT: 45 %
PCO2 VEN: 41.9 mmHg — AB (ref 44.0–60.0)
PO2 VEN: 24 mmHg — AB (ref 32.0–45.0)
TCO2: 29 mmol/L (ref 0–100)
pH, Ven: 7.43 (ref 7.250–7.430)

## 2016-10-12 LAB — RAPID URINE DRUG SCREEN, HOSP PERFORMED
AMPHETAMINES: NOT DETECTED
BARBITURATES: NOT DETECTED
BENZODIAZEPINES: NOT DETECTED
Cocaine: NOT DETECTED
Opiates: NOT DETECTED
Tetrahydrocannabinol: NOT DETECTED

## 2016-10-12 LAB — URINALYSIS, ROUTINE W REFLEX MICROSCOPIC
BILIRUBIN URINE: NEGATIVE
Glucose, UA: >1000 mg/dL — AB
KETONES UR: 15 mg/dL — AB
Leukocytes, UA: NEGATIVE
Nitrite: NEGATIVE
PROTEIN: 30 mg/dL — AB
Specific Gravity, Urine: 1.028 (ref 1.005–1.030)
pH: 6 (ref 5.0–8.0)

## 2016-10-12 LAB — CBC
HEMATOCRIT: 43.2 % (ref 39.0–52.0)
Hemoglobin: 15.5 g/dL (ref 13.0–17.0)
MCH: 30.9 pg (ref 26.0–34.0)
MCHC: 35.9 g/dL (ref 30.0–36.0)
MCV: 86.2 fL (ref 78.0–100.0)
Platelets: 147 10*3/uL — ABNORMAL LOW (ref 150–400)
RBC: 5.01 MIL/uL (ref 4.22–5.81)
RDW: 11.9 % (ref 11.5–15.5)
WBC: 8.6 10*3/uL (ref 4.0–10.5)

## 2016-10-12 LAB — GLUCOSE, CAPILLARY: Glucose-Capillary: 278 mg/dL — ABNORMAL HIGH (ref 65–99)

## 2016-10-12 LAB — DIFFERENTIAL
BASOS ABS: 0 10*3/uL (ref 0.0–0.1)
BASOS PCT: 1 %
Eosinophils Absolute: 0.1 10*3/uL (ref 0.0–0.7)
Eosinophils Relative: 1 %
LYMPHS PCT: 27 %
Lymphs Abs: 2.3 10*3/uL (ref 0.7–4.0)
MONOS PCT: 6 %
Monocytes Absolute: 0.5 10*3/uL (ref 0.1–1.0)
NEUTROS ABS: 5.7 10*3/uL (ref 1.7–7.7)
Neutrophils Relative %: 65 %

## 2016-10-12 LAB — APTT: APTT: 22 s — AB (ref 24–36)

## 2016-10-12 LAB — URINE MICROSCOPIC-ADD ON

## 2016-10-12 LAB — I-STAT TROPONIN, ED: Troponin i, poc: 0.02 ng/mL (ref 0.00–0.08)

## 2016-10-12 LAB — AMMONIA: AMMONIA: 21 umol/L (ref 9–35)

## 2016-10-12 LAB — CBG MONITORING, ED
GLUCOSE-CAPILLARY: 385 mg/dL — AB (ref 65–99)
Glucose-Capillary: 464 mg/dL — ABNORMAL HIGH (ref 65–99)
Glucose-Capillary: 272 mg/dL — ABNORMAL HIGH (ref 65–99)

## 2016-10-12 LAB — ETHANOL

## 2016-10-12 LAB — PROTIME-INR
INR: 1.01
Prothrombin Time: 13.3 seconds (ref 11.4–15.2)

## 2016-10-12 LAB — VALPROIC ACID LEVEL: Valproic Acid Lvl: 116 ug/mL — ABNORMAL HIGH (ref 50.0–100.0)

## 2016-10-12 LAB — BETA-HYDROXYBUTYRIC ACID: Beta-Hydroxybutyric Acid: 1.1 mmol/L — ABNORMAL HIGH (ref 0.05–0.27)

## 2016-10-12 MED ORDER — INSULIN ASPART 100 UNIT/ML IV SOLN
15.0000 [IU] | Freq: Once | INTRAVENOUS | Status: AC
  Administered 2016-10-12: 15 [IU] via INTRAVENOUS
  Filled 2016-10-12: qty 1

## 2016-10-12 MED ORDER — INSULIN ASPART 100 UNIT/ML ~~LOC~~ SOLN
0.0000 [IU] | Freq: Three times a day (TID) | SUBCUTANEOUS | Status: DC
  Administered 2016-10-13: 3 [IU] via SUBCUTANEOUS
  Administered 2016-10-13: 8 [IU] via SUBCUTANEOUS
  Administered 2016-10-13: 5 [IU] via SUBCUTANEOUS
  Administered 2016-10-14: 3 [IU] via SUBCUTANEOUS
  Administered 2016-10-14 (×2): 5 [IU] via SUBCUTANEOUS

## 2016-10-12 MED ORDER — SODIUM CHLORIDE 0.9 % IV SOLN
INTRAVENOUS | Status: DC
  Administered 2016-10-12: 22:00:00 via INTRAVENOUS

## 2016-10-12 MED ORDER — ROSUVASTATIN CALCIUM 5 MG PO TABS
5.0000 mg | ORAL_TABLET | Freq: Every day | ORAL | Status: DC
  Administered 2016-10-13 – 2016-10-14 (×2): 5 mg via ORAL
  Filled 2016-10-12 (×2): qty 1

## 2016-10-12 MED ORDER — SODIUM CHLORIDE 0.9 % IV BOLUS (SEPSIS): 500.0000 mL | Freq: Once | INTRAVENOUS | Status: DC

## 2016-10-12 MED ORDER — ACETAMINOPHEN 650 MG RE SUPP: 650.0000 mg | Freq: Four times a day (QID) | RECTAL | Status: DC | PRN

## 2016-10-12 MED ORDER — ASPIRIN 81 MG PO CHEW
81.0000 mg | CHEWABLE_TABLET | Freq: Every day | ORAL | Status: DC
  Administered 2016-10-13 – 2016-10-14 (×2): 81 mg via ORAL
  Filled 2016-10-12 (×2): qty 1

## 2016-10-12 MED ORDER — DIVALPROEX SODIUM 500 MG PO DR TAB: 1500.0000 mg | DELAYED_RELEASE_TABLET | Freq: Every day | ORAL | Status: DC

## 2016-10-12 MED ORDER — NICOTINE 14 MG/24HR TD PT24
14.0000 mg | MEDICATED_PATCH | Freq: Once | TRANSDERMAL | Status: AC
  Administered 2016-10-12: 14 mg via TRANSDERMAL
  Filled 2016-10-12: qty 1

## 2016-10-12 MED ORDER — ACETAMINOPHEN 325 MG PO TABS: 650.0000 mg | ORAL_TABLET | Freq: Four times a day (QID) | ORAL | Status: DC | PRN

## 2016-10-12 MED ORDER — TRAMADOL HCL 50 MG PO TABS: 50.0000 mg | ORAL_TABLET | Freq: Four times a day (QID) | ORAL | Status: DC | PRN

## 2016-10-12 MED ORDER — LORAZEPAM 2 MG/ML IJ SOLN
1.0000 mg | Freq: Once | INTRAMUSCULAR | Status: AC
  Administered 2016-10-12: 1 mg via INTRAVENOUS

## 2016-10-12 MED ORDER — SENNOSIDES-DOCUSATE SODIUM 8.6-50 MG PO TABS: 1.0000 | ORAL_TABLET | Freq: Every evening | ORAL | Status: DC | PRN

## 2016-10-12 MED ORDER — LORAZEPAM 2 MG/ML IJ SOLN
INTRAMUSCULAR | Status: AC
  Administered 2016-10-12: 1 mg via INTRAVENOUS
  Filled 2016-10-12: qty 1

## 2016-10-12 MED ORDER — SODIUM CHLORIDE 0.9 % IV BOLUS (SEPSIS)
500.0000 mL | Freq: Once | INTRAVENOUS | Status: AC
  Administered 2016-10-12: 500 mL via INTRAVENOUS

## 2016-10-12 MED ORDER — ENOXAPARIN SODIUM 60 MG/0.6ML ~~LOC~~ SOLN
55.0000 mg | SUBCUTANEOUS | Status: DC
  Administered 2016-10-12: 55 mg via SUBCUTANEOUS
  Filled 2016-10-12 (×3): qty 0.55

## 2016-10-12 NOTE — H&P (Signed)
History and Physical    Caleb Joseph [id number] DOB: [birth year] DOA: 10/12/2016  PCP: Chesley Noon, MD   Chief Complaint: Altered Mental Status  HPI: Caleb Joseph is a 60 y.o. 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 Patient unable to provide a subjective History at this point and had no family present at bedside so most of the history was gathered from [place] and Nursing. Patient Had a Code Stroke and was evaluated by Neurology who thought it was more acute Toxic Metabolic Encephalopathy. Apparently on arrival patient was confused and agitated and swearing, and was given Lorazepam and was more somnolent and altered on my exam. Patient had no facial droop on my exam but had ripped of his gown and covered in Urine. When talking with the ED nurse, she stated patient did not have the best hygiene and had defecation stains in his underwear. When speaking to the patient he responds to name but is tremulous and does not cooperate with questioning and examination. Unable to tell me where he is a at this point but pulls the cover over himself and is somewhat combative.   ED Course: Bolused 500 mL x2, Had Head CT Code Stroke, Was evaluated by Neurology and had basic blood work drawn. Patient was also given IV lorazepam for his agitation.   Review of Systems: Attempted but unable to perform as patient is encephalopathic and does not appropriately answer to questioning.   Past Medical History:  Diagnosis Date  . Abscess of prostate 02/17/2009  . Diabetes mellitus   . Diabetic foot infection ([place]) 12/26/2011  .  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 HYPERLIPIDEMIA 08/26/2007  . Hypertension   . 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 AMPUTATION  12/30/2011   Procedure: AMPUTATION DIGIT;  Surgeon: Marybelle Killings, MD;  Location: [place];  Service: Orthopedics;  Laterality: Left;  Left [place]  . AMPUTATION  03/20/2012   Procedure: AMPUTATION DIGIT;  Surgeon: Mcarthur Rossetti, MD;  Location: [place];  Service: Orthopedics;  Laterality: Left;  Left foot 3rd toe amputation  . CARDIAC CATHETERIZATION    . I&D EXTREMITY Left 09/28/2015   Procedure: IRRIGATION AND DRAINAGE OF LEFT FOOT with second and third metatarsal amputation;  Surgeon: Mcarthur Rossetti, MD;  Location: [place];  Service: Orthopedics;  Laterality: Left;  . TOE AMPUTATION  08/2009   R little toe   SOCIAL HISTORY Reported that he has been smoking Cigars and Pipe.  His smokeless tobacco use includes Chew and Snuff. He reports that he drinks alcohol. He reports that he does not use drugs.  No Known Allergies  FAMILY No family history on file. Unable to obtain from Patient because of current Status.    Prior to Admission medications   Medication Sig Start Date End Date Taking? Authorizing Provider  albuterol (PROVENTIL HFA;VENTOLIN HFA) 108 (90 BASE) MCG/ACT inhaler Inhale 2 puffs into the lungs every 6 (six) hours as needed. For cold symptoms Patient taking differently: Inhale 2 puffs into the lungs every 6 (six) hours as needed (cold symptoms). For cold symptoms 12/16/12  Gerda Diss, DO  aspirin 81 MG tablet Take 1 tablet (81 mg total) by mouth daily. 03/17/13   Gerda Diss, DO  cholecalciferol (VITAMIN D) 1000 UNITS tablet Take 1,000 Units by mouth daily. 06/23/15   Historical Provider, MD  divalproex (DEPAKOTE) 500 MG DR tablet Take 1,500 mg by mouth at bedtime. 08/23/15   Historical Provider, MD  DULoxetine (CYMBALTA) 30 MG capsule Take 1 capsule (30  mg total) by mouth at bedtime. 12/16/12   Gerda Diss, DO  Ferrous Sulfate (IRON) 325 (65 FE) MG TABS Take 1 tablet by mouth daily.    Historical Provider, MD  gabapentin (NEURONTIN) 600 MG tablet  05/31/16   Historical Provider, MD  ibuprofen (ADVIL,MOTRIN) 800 MG tablet  06/11/16   Historical Provider, MD  insulin aspart (NOVOLOG) 100 UNIT/ML injection Inject 2-15 Units into the skin 3 (three) times daily before meals. CBG < 70: implement hypoglycemia protocol  CBG 70 - 120: 0 units  CBG 121 - 150: 2 units  CBG 151 - 200: 3 units  CBG 201 - 250: 5 units  CBG 251 - 300: 7 units  CBG 301 - 350: 10 units  CBG 351 - 400: 12 units CBG >400:  15 Units 09/03/13   Gerda Diss, DO  Insulin Glargine (TOUJEO SOLOSTAR) 300 UNIT/ML SOPN Inject 40 Units into the skin at bedtime.    Historical Provider, MD  lisinopril (PRINIVIL,ZESTRIL) 2.5 MG tablet Take 2.5 mg by mouth daily.    Historical Provider, MD  losartan (COZAAR) 25 MG tablet Take by mouth. 05/24/16 05/24/17  Historical Provider, MD  Lutein 10 MG TABS Take 10 mg by mouth daily.    Historical Provider, MD  metFORMIN (GLUCOPHAGE) 1000 MG tablet Take 1 tablet (1,000 mg total) by mouth 2 (two) times daily with a meal. 04/22/13   Gerda Diss, DO  omega-3 acid ethyl esters (LOVAZA) 1 G capsule Take 2 capsules (2 g total) by mouth 2 (two) times daily. Patient taking differently: Take 4 g by mouth 2 (two) times daily.  09/14/13   Gerda Diss, DO  oxyCODONE (OXY IR/ROXICODONE) 5 MG immediate release tablet Take 1 tablet (5 mg total) by mouth every 3 (three) hours as needed for breakthrough pain. 09/29/15   Bonnielee Haff, MD  rosuvastatin (CRESTOR) 5 MG tablet Take 1 tablet (5 mg total) by mouth daily. 03/17/13   Gerda Diss, DO    Physical Exam: Vitals:   10/12/16 1545 10/12/16 1606  BP:  103/65  Pulse: 108   Resp: 23   Temp:  97.7 F (36.5 C)  SpO2: 100%      Constitutional: Disheveled, obese appears agitated and altered Eyes:  PERRL. Lids and conjunctivae normal, sclerae anicteric  ENMT: External Ears, Nose appear normal. Grossly normal hearing as patient answered to his name. Poor dentition.  Neck: Appears normal, supple, no cervical masses, normal ROM, no appreciable thyromegaly Respiratory: Clear to auscultation bilaterally, no wheezing, rales, rhonchi or crackles. Normal respiratory effort and patient is not tachypenic. No accessory muscle use.  Cardiovascular: Tachycardic Rate, no murmurs / rubs / gallops. S1 and S2 auscultated. No extremity edema.  Abdomen: Soft, non-tender, non-distended. No masses palpated. No appreciable hepatosplenomegaly. Bowel sounds positive x4.  GU: Deferred. Musculoskeletal: No clubbing / cyanosis of digits/nails. Patient has Bilateral Big Toe Amputations.  Skin: No rashes, lesions, ulcers. No induration; Warm and dry.  Neurologic: Limited Neuro examination due to current status. Pupils react to light. Patient does not  follow verbal commands but does answer to his name and asks "what?" Patient has bilateral Upper Extremity Tremors  Psychiatric: Altered judgment and insight. Awake but not oriented. Agitated mood and affect.   Labs on Admission: I have personally reviewed following labs and imaging studies  CBC:  Recent Labs Lab 10/12/16 1530 10/12/16 1541  WBC 8.6  --   NEUTROABS 5.7  --   HGB 15.5 16.3  HCT 43.2 48.0  MCV 86.2  --   PLT 147*  --    Basic Metabolic Panel:  Recent Labs Lab 10/12/16 1530 10/12/16 1541  NA 130* 131*  K 5.1 5.1  CL 94* 94*  CO2 20*  --   GLUCOSE 543* 549*  BUN 22* 28*  CREATININE 1.33* 1.00  CALCIUM 9.9  --    GFR: CrCl cannot be calculated (Unknown ideal weight.). Liver Function Tests:  Recent Labs Lab 10/12/16 1530  AST 30  ALT 20  ALKPHOS 63  BILITOT 1.4*  PROT 7.9  ALBUMIN 4.7   No results for input(s): LIPASE, AMYLASE in the last 168 hours. No results for input(s): AMMONIA in the last 168 hours. Coagulation  Profile:  Recent Labs Lab 10/12/16 1530  INR 1.01   Cardiac Enzymes: No results for input(s): CKTOTAL, CKMB, CKMBINDEX, TROPONINI in the last 168 hours. BNP (last 3 results) No results for input(s): PROBNP in the last 8760 hours. HbA1C: No results for input(s): HGBA1C in the last 72 hours. CBG:  Recent Labs Lab 10/12/16 1725  GLUCAP 464*   Lipid Profile: No results for input(s): CHOL, HDL, LDLCALC, TRIG, CHOLHDL, LDLDIRECT in the last 72 hours. Thyroid Function Tests: No results for input(s): TSH, T4TOTAL, FREET4, T3FREE, THYROIDAB in the last 72 hours. Anemia Panel: No results for input(s): VITAMINB12, FOLATE, FERRITIN, TIBC, IRON, RETICCTPCT in the last 72 hours. Urine analysis:    Component Value Date/Time   COLORURINE YELLOW 10/12/2016 [place] 10/12/2016 1641   LABSPEC 1.028 10/12/2016 1641   PHURINE 6.0 10/12/2016 1641   GLUCOSEU >1000 (A) 10/12/2016 1641   HGBUR SMALL (A) 10/12/2016 1641   HGBUR negative 05/20/2009 1155   BILIRUBINUR NEGATIVE 10/12/2016 1641   KETONESUR 15 (A) 10/12/2016 1641   PROTEINUR 30 (A) 10/12/2016 1641   UROBILINOGEN 0.2 09/25/2015 2110   NITRITE NEGATIVE 10/12/2016 1641   LEUKOCYTESUR NEGATIVE 10/12/2016 1641   Sepsis Labs: !!!!!!!!!!!!!!!!!!!!!!!!!!!!!!!!!!!!!!!!!!!! @LABRCNTIP (procalcitonin:4,lacticidven:4) )No results found for this or any previous visit (from the past 240 hour(s)).   Radiological Exams on Admission: Dg Chest Portable 1 View  Result Date: 10/12/2016 CLINICAL DATA:  Altered mental status, combative. EXAM: PORTABLE CHEST 1 VIEW COMPARISON:  Chest radiograph March 21, 2015 FINDINGS: The heart size and mediastinal contours are within normal limits. Both lungs are clear. The visualized skeletal structures are unremarkable. IMPRESSION: No acute cardiopulmonary process. Electronically Signed   By: Elon Alas M.D.   On: 10/12/2016 17:45   Ct Head Code Stroke W/o Cm  Result Date:  10/12/2016 CLINICAL DATA:  Code stroke.  Right-sided facial droop. EXAM: CT HEAD WITHOUT CONTRAST TECHNIQUE: Contiguous axial images were obtained from the base of the skull through the vertex without intravenous contrast. COMPARISON:  11/23/2009 CT head FINDINGS: Brain: No evidence of acute infarction, hemorrhage, hydrocephalus, extra-axial collection or mass lesion/mass effect. Motion artifact. Moderate parenchymal volume loss for age. Pontine lucencies, appear present on prior CT, likely old lacunar infarct. Vascular: Calcific atherosclerosis of cavernous internal carotid arteries. No hyperdense vessel identified. Skull: Normal. Negative for fracture or  focal lesion. Sinuses/Orbits: No acute finding. Other: None. ASPECTS [facility] Stroke Program Early CT Score) - Ganglionic level infarction (caudate, lentiform nuclei, internal capsule, insula, M1-M3 cortex): 7 - Supraganglionic infarction (M4-M6 cortex): 3 Total score (0-10 with 10 being normal): 10 IMPRESSION: 1. No acute intracranial abnormality identified.  Motion artifact. 2. Moderate brain parenchymal volume loss for age. 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 3. ASPECTS is 10 These results were called by telephone at the time of interpretation on 10/12/2016 at 3:28 pm to Dr. Shon Hale, who verbally acknowledged these results. Electronically Signed   By: Kristine Garbe M.D.   On: 10/12/2016 15:32    EKG: Independently reviewed. Showed Sinus Tachycardia at a rate of 112 with RBBB and LAFB which were old compared to previous EKG. No discernable ST-Elevation or Depression on my read.   Assessment/Plan Active Problems:   Altered mental status, unspecified   Encephalopathy  1. Acute Metabolic Encephalopathy with Agitation and Confusion -Neurology Consulted by EDP -Check UDS, U/A, Urine Cx, Blood Cx's, MRI of Brain without Contrast, Ammonia Level -Patient needs Sitter because of Agitation/Encephalopathy -Nicotine Patch for  Nicotine Withdrawal -Monitor For EtOH withdrawal -Social Worker Consulted to Assess Living Situation  -May need EEG -NPO until Swallow Screen -Cardiac Monitoring with Telemetry -? Hx of Bipolar Disorder on Depakote -Will Obtain Depakote Level -Hold Sedatives and Opiates; Will Hold Gabapentin and Duloxetine  2. Hyperglycemia in the Setting of IDDM complicated by Neruopathy -Patient has Hx of DKA; Given Bolus of 500 mL x2 in ED -AG was 16 on Admission -Check LA level -BS was 464 on Repeat -Check Ketones in Urine and Serum as patient has a Hx of DKA -Check VBG -IVF Rehydration at 100 mL/hr; -Moderate Novolog SSI -C/w CBG's -Will need Insulin gtt if Ketones +   3. AG Metabolic Acidosis  -AG was 16 on Admission -Check Lactic Acidosis -Hold Metformin -? DKA -Repeat BMP -ABG  4. AKI likely 2/2 to Dehydration  -BUN/Cr was 22/1.33 on Admission -Repeat BMP -IV Fluid Rehydration at 100 mL/hr  5. Psuedohyponatremia in the setting of Hyperglycemia -Corrected Sodium was 137 -Repeat BMP  6. Hypertension -Will Hold patient's Antihypertensives at this time -Patient was on Losartan and Lisinopril as an outpatient. Will need to find out what patient is actually Taking  7. Hyperlipidemia -Check Lipid Panel -Continue with Statin  8. Heavy Smokeless Tobacco Abuse -Nicotine Patch  9. Bilateral Hand Tremor -Neurology Following  DVT prophylaxis: Lovenox Code Status: Full Family Communication: No Family at Bedside Disposition Plan: Admit to Telemetry with Sitter Consults called: Neurology by EDP Admission status: Inpatient  Kerney Elbe, D.O. [facility] Pager [phone]  If 7PM-7AM, please contact night-coverage [email or web address] Password [email or web address]  10/12/2016, 6:10 PM

## 2016-10-12 NOTE — ED Notes (Addendum)
Pt becoming increasingly agitated.  Stating, "Leave me alone, Get away from Me" and "God Damn you"!  Pt pulling off leads, etc.

## 2016-10-12 NOTE — ED Notes (Signed)
Elevated glucose on Chem-8 reported to Dr. Dolly Rias

## 2016-10-12 NOTE — ED Provider Notes (Signed)
[facility] DEPT Provider Note   CSN: [id number] Arrival date & time: 10/12/16  1505   An emergency department physician performed an initial assessment on this suspected stroke patient at 57.  History   Chief Complaint Chief Complaint  Patient presents with  . Code Stroke    HPI Caleb Joseph is a 60 y.o. male.  Patient presents as a code stroke. Patient has history of diabetes, diabetic foot infection, neuropathy, lipids, high blood pressure, sepsis presents with altered mental status. Around 10:30 this morning patient was driving erratic and confused to baseline. Patient had aphasia worse expressive. No witnessed seizures. No witnessed head injuries. Difficulty obtaining any details from the patient no family at bedside. Majority of history is per medical record and in [place].      Past Medical History:  Diagnosis Date  . Abscess of prostate 02/17/2009  . Diabetes mellitus   . Diabetic foot infection ([place]) 12/26/2011  . 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 HYPERLIPIDEMIA 08/26/2007  . Hypertension   . 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 DKA (diabetic ketoacidoses) ([place]) 09/25/2015  . 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 Hyponatremia 09/25/2015  . Lactic acidosis 09/25/2015  . Dysplastic nevus of skin 03/17/2013  . Hx of noncompliance with medical treatment, presenting hazards to health 12/16/2012  . ULNAR NEUROPATHY, RIGHT 06/02/2010  . Other specified disorder of male genital organs(608.89) 11/22/2009  . ELECTROCARDIOGRAM, ABNORMAL 11/22/2009  . PVD 07/19/2009  . DYSPNEA 07/19/2009  . 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 ANEMIA, VITAMIN B12 DEFICIENCY 07/17/2008  . DENTAL CARIES 07/07/2008  . DEPRESSION & PTSD, ?Bipolar 05/11/2008  . Diabetic neuropathy ([place]) 10/02/2007  . DIABETES MELLITUS, TYPE II, ON INSULIN, UNCONTROLLED 10/02/2007  . OLIGOSPERMIA 10/02/2007    . INGUINAL PAIN, RIGHT 10/02/2007  . Dyslipidemia 08/26/2007  . Essential hypertension 08/26/2007    Past Surgical History:  Procedure Laterality Date  . AMPUTATION  12/30/2011   Procedure: AMPUTATION DIGIT;  Surgeon: Marybelle Killings, MD;  Location: [facility];  Service: Orthopedics;  Laterality: Left;  Left Great  . AMPUTATION  03/20/2012   Procedure: AMPUTATION DIGIT;  Surgeon: Mcarthur Rossetti, MD;  Location: [facility];  Service: Orthopedics;  Laterality: Left;  Left foot 3rd toe amputation  . CARDIAC CATHETERIZATION    . I&D EXTREMITY Left 09/28/2015   Procedure: IRRIGATION AND DRAINAGE OF LEFT FOOT with second and third metatarsal amputation;  Surgeon: Mcarthur Rossetti, MD;  Location: [facility];  Service: Orthopedics;  Laterality: Left;  . TOE AMPUTATION  08/2009   R little toe       Home Medications    Prior to Admission medications   Medication Sig Start Date End Date Taking? Authorizing Provider  albuterol (PROVENTIL HFA;VENTOLIN HFA) 108 (90 BASE) MCG/ACT inhaler Inhale 2 puffs into the lungs every 6 (six) hours as needed. For cold symptoms Patient taking differently: Inhale 2 puffs into the lungs every 6 (six) hours as needed (cold symptoms). For cold symptoms 12/16/12   Gerda Diss, DO  aspirin 81 MG tablet Take 1 tablet (81 mg total) by mouth daily. 03/17/13   Gerda Diss, DO  cholecalciferol (VITAMIN D) 1000 UNITS tablet Take 1,000 Units by mouth daily. 06/23/15   Historical Provider, MD  divalproex (DEPAKOTE) 500 MG DR tablet Take 1,500 mg by mouth at bedtime. 08/23/15   Historical Provider, MD  DULoxetine (CYMBALTA) 30 MG capsule Take 1 capsule (30  mg total) by mouth at bedtime. 12/16/12   Gerda Diss, DO  Ferrous Sulfate (IRON) 325 (65 FE) MG TABS Take 1 tablet by mouth daily.    Historical Provider, MD  gabapentin (NEURONTIN) 600 MG tablet  05/31/16   Historical Provider, MD  ibuprofen (ADVIL,MOTRIN) 800 MG tablet  06/11/16   Historical Provider, MD  insulin aspart  (NOVOLOG) 100 UNIT/ML injection Inject 2-15 Units into the skin 3 (three) times daily before meals. CBG < 70: implement hypoglycemia protocol  CBG 70 - 120: 0 units  CBG 121 - 150: 2 units  CBG 151 - 200: 3 units  CBG 201 - 250: 5 units  CBG 251 - 300: 7 units  CBG 301 - 350: 10 units  CBG 351 - 400: 12 units CBG >400:  15 Units 09/03/13   Gerda Diss, DO  Insulin Glargine (TOUJEO SOLOSTAR) 300 UNIT/ML SOPN Inject 40 Units into the skin at bedtime.    Historical Provider, MD  lisinopril (PRINIVIL,ZESTRIL) 2.5 MG tablet Take 2.5 mg by mouth daily.    Historical Provider, MD  losartan (COZAAR) 25 MG tablet Take by mouth. 05/24/16 05/24/17  Historical Provider, MD  Lutein 10 MG TABS Take 10 mg by mouth daily.    Historical Provider, MD  metFORMIN (GLUCOPHAGE) 1000 MG tablet Take 1 tablet (1,000 mg total) by mouth 2 (two) times daily with a meal. 04/22/13   Gerda Diss, DO  omega-3 acid ethyl esters (LOVAZA) 1 G capsule Take 2 capsules (2 g total) by mouth 2 (two) times daily. Patient taking differently: Take 4 g by mouth 2 (two) times daily.  09/14/13   Gerda Diss, DO  oxyCODONE (OXY IR/ROXICODONE) 5 MG immediate release tablet Take 1 tablet (5 mg total) by mouth every 3 (three) hours as needed for breakthrough pain. 09/29/15   Bonnielee Haff, MD  rosuvastatin (CRESTOR) 5 MG tablet Take 1 tablet (5 mg total) by mouth daily. 03/17/13   Gerda Diss, DO    Family History No family history on file.  Social History Social History  Substance Use Topics  . Smoking status: Current Some Day Smoker    Types: Cigars, Pipe  . Smokeless tobacco: Current User    Types: Chew, Snuff     Comment: occasional chew and cigarette, pipe, cigar user  . Alcohol use Yes     Comment: occasional     Allergies   Patient has no known allergies.   Review of Systems Review of Systems  Unable to perform ROS: Mental status change     Physical Exam Updated Vital Signs There were no vitals taken  for this visit.  Physical Exam  Constitutional: He appears well-developed.  HENT:  Head: Normocephalic.  Dry mucous membranes  Eyes: Right eye exhibits no discharge. Left eye exhibits no discharge.  Neck: Neck supple.  Cardiovascular: Normal rate and regular rhythm.   No murmur heard. Pulmonary/Chest: Effort normal.  Abdominal: Soft. There is no tenderness.  Musculoskeletal: He exhibits no edema.  Neurological: He is alert.  Patient not following commands. Patient will intermittently move all extremities grossly equal strength equal sensation bilateral. EOM muscle function intact. Confused general, neck supple  Skin: Skin is warm and dry.  Psychiatric:  Altered mental state  Nursing note and vitals reviewed.    ED Treatments / Results  Labs (all labs ordered are listed, but only abnormal results are displayed) Labs Reviewed  CBC - Abnormal; Notable for the following:  Result Value   Platelets 147 (*)    All other components within normal limits  I-STAT CHEM 8, ED - Abnormal; Notable for the following:    Sodium 131 (*)    Chloride 94 (*)    BUN 28 (*)    Glucose, Bld 549 (*)    All other components within normal limits  DIFFERENTIAL  ETHANOL  PROTIME-INR  APTT  COMPREHENSIVE METABOLIC PANEL  RAPID URINE DRUG SCREEN, HOSP PERFORMED  URINALYSIS, ROUTINE W REFLEX MICROSCOPIC (NOT AT [facility])  BLOOD GAS, VENOUS  I-STAT TROPOININ, ED    EKG  EKG Interpretation None       Radiology Ct Head Code Stroke W/o Cm  Result Date: 10/12/2016 CLINICAL DATA:  Code stroke.  Right-sided facial droop. EXAM: CT HEAD WITHOUT CONTRAST TECHNIQUE: Contiguous axial images were obtained from the base of the skull through the vertex without intravenous contrast. COMPARISON:  11/23/2009 CT head FINDINGS: Brain: No evidence of acute infarction, hemorrhage, hydrocephalus, extra-axial collection or mass lesion/mass effect. Motion artifact. Moderate parenchymal volume loss for age.  Pontine lucencies, appear present on prior CT, likely old lacunar infarct. Vascular: Calcific atherosclerosis of cavernous internal carotid arteries. No hyperdense vessel identified. Skull: Normal. Negative for fracture or focal lesion. Sinuses/Orbits: No acute finding. Other: None. ASPECTS [facility] Stroke Program Early CT Score) - Ganglionic level infarction (caudate, lentiform nuclei, internal capsule, insula, M1-M3 cortex): 7 - Supraganglionic infarction (M4-M6 cortex): 3 Total score (0-10 with 10 being normal): 10 IMPRESSION: 1. No acute intracranial abnormality identified.  Motion artifact. 2. Moderate brain parenchymal volume loss for age. 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 3. ASPECTS is 10 These results were called by telephone at the time of interpretation on 10/12/2016 at 3:28 pm to Dr. Shon Hale, who verbally acknowledged these results. Electronically Signed   By: Kristine Garbe M.D.   On: 10/12/2016 15:32    Procedures Procedures (including critical care time)  Medications Ordered in ED Medications  nicotine (NICODERM CQ - dosed in mg/24 hours) patch 14 mg (not administered)  sodium chloride 0.9 % bolus 500 mL (not administered)     Initial Impression / Assessment and Plan / ED Course  I have reviewed the triage vital signs and the nursing notes.  Pertinent labs & imaging results that were available during my care of the patient were reviewed by me and considered in my medical decision making (see chart for details).  Clinical Course    Pt presents with ams, code stroke.  Patient clinically is general confusion and aphasia expressive no focal deficits. Patient out of any TPA window and inconsistent with acute stroke. Neurology called off code stroke. Plan for medical admission.  The patients results and plan were reviewed and discussed.   Any x-rays performed were independently reviewed by myself.   Differential diagnosis were considered with the  presenting HPI.  Medications  nicotine (NICODERM CQ - dosed in mg/24 hours) patch 14 mg (not administered)  sodium chloride 0.9 % bolus 500 mL (500 mLs Intravenous New Bag/Given 10/12/16 1558)    There were no vitals filed for this visit.  Final diagnoses:  Aphasia  Altered mental status, unspecified altered mental status type  Hyperglycemia    Admission/ observation were discussed with the admitting physician, patient and/or family and they are comfortable with the plan.   Final Clinical Impressions(s) / ED Diagnoses   Final diagnoses:  Aphasia  Altered mental status, unspecified altered mental status type  Hyperglycemia    New Prescriptions New  Prescriptions   No medications on file     Elnora Morrison, MD 10/15/16 0045

## 2016-10-12 NOTE — Progress Notes (Signed)
Code stroke called at 1445, Patient arrived to [facility] ED via [facility]. As per EMS, Patient LSN 1030, was with family when patient started driving crazy and became confused, facial droop and aphasia.  On arrival to [facility] ED CBG 513,170/103, HR 95, RR 18.  NIHSS 3.  Code stroke cancelled at [facility]

## 2016-10-12 NOTE — Progress Notes (Addendum)
Notified by lab Lactic acid of 2.7 at 2120. Notified MD with new order with increase NS at 160ml/hr.

## 2016-10-12 NOTE — Progress Notes (Signed)
Arrived from ED at 2000. Alert and oriented to person and place. Bilateral hand tremors intermittently. Sitter at bedside. Safety measure in place.

## 2016-10-12 NOTE — Consult Note (Addendum)
Neurology Consult Note  Reason for Consultation: CODE STROKE  Requesting provider: Activated in field by EMS; ED MD [place]  CC: None  HPI: This is a 38-yo RH man brought to [facility] ED by EMS as a CODE STROKE. The patient is agitated and appears encephalopathic and does not provide any meaningful history.   According to EMS, he was apparently driving with family members at about 45 this morning when his family became concerned because he was driving erratically. They had him pull over and then noted that he could not get out of the car because he was stumbling. He appeared confused. They eventually called EMS several hours later (unclear why such a delay) and CODE STROKE was paged out by EMS at 1447. EMS arrived and noted that patient to be confused with possible aphasia and a facial droop. Initial blood glucose was 513. Blood pressure for EMS was 170/103.   I met the patient on arrival in the ED with the rest of the stroke team. He was overtly encephalopathic with irregular intermittent coarse tremors of both hands. He was taken for an emergent CT of the head which did not show any acute abnormality. He grew increasingly agitated and tried to climb off the CT table himself. He remained agitated and resisted attempts to examine him and change him into a gown, at times shouting and swearing. Neuro assessment was notable for slurred speech, intermittent BUE tremor. At one point when the RN tried to return his driver's license, he had trouble reaching out to grab it. However, he was able to grab other things without difficulty. NIHSS was 3. It was felt that his presentation was more suggestive of a possible encephalopathy given glucose of 513 and lack of focal neurologic findings. CODE STROKE was canceled.   Last known well: 1030  NIHSS score: 3 tPA given?: No, out of window, suspect alternative diagnosis  PMH: Limited to review of the medical record as the patient is unable to provide due to  encephalopathy. Past Medical History:  Diagnosis Date  . Abscess of prostate 02/17/2009  . Diabetes mellitus   . Diabetic foot infection ([place]) 12/26/2011  . 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 HYPERLIPIDEMIA 08/26/2007  . Hypertension   . 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 Past Surgical History:  Procedure Laterality Date  . AMPUTATION  12/30/2011   Procedure: AMPUTATION DIGIT;  Surgeon: Marybelle Killings, MD;  Location: [facility];  Service: Orthopedics;  Laterality: Left;  Left Great  . AMPUTATION  03/20/2012   Procedure: AMPUTATION DIGIT;  Surgeon: Mcarthur Rossetti, MD;  Location: [place];  Service: Orthopedics;  Laterality: Left;  Left foot 3rd toe amputation  . CARDIAC CATHETERIZATION    . I&D EXTREMITY Left 09/28/2015   Procedure: IRRIGATION AND DRAINAGE OF LEFT FOOT with second and third metatarsal amputation;  Surgeon: Mcarthur Rossetti, MD;  Location: [facility];  Service: Orthopedics;  Laterality: Left;  . TOE AMPUTATION  08/2009   R little toe    Family history: Limited to review of the medical record as the patient is unable to provide due to encephalopathy.  No family history on file.  Social history: Limited to review of the medical record as the patient is unable to provide due to encephalopathy.  Social History   Social History  . Marital status: Divorced    Spouse name: N/A  .  Number of children: N/A  . Years of education: N/A   Occupational History  . Not on file.   Social History Main Topics  . Smoking status: Current Some Day Smoker    Types: Cigars, Pipe  . Smokeless tobacco: Current User    Types: Chew, Snuff     Comment: occasional chew and cigarette, pipe, cigar user  . Alcohol use Yes     Comment: occasional  . Drug use: No  . Sexual activity: Not on file   Other Topics Concern  . Not on file   Social History  Narrative   Disability.          Current inpatient meds:  Current Facility-Administered Medications  Medication Dose Route Frequency Provider Last Rate Last Dose  . nicotine (NICODERM CQ - dosed in mg/24 hours) patch 14 mg  14 mg Transdermal Once Gibson Ramp, MD   14 mg at 10/12/16 1605   Current Outpatient Prescriptions  Medication Sig Dispense Refill  . albuterol (PROVENTIL HFA;VENTOLIN HFA) 108 (90 BASE) MCG/ACT inhaler Inhale 2 puffs into the lungs every 6 (six) hours as needed. For cold symptoms (Patient taking differently: Inhale 2 puffs into the lungs every 6 (six) hours as needed (cold symptoms). For cold symptoms) 1 Inhaler 1  . aspirin 81 MG tablet Take 1 tablet (81 mg total) by mouth daily. 30 tablet   . cholecalciferol (VITAMIN D) 1000 UNITS tablet Take 1,000 Units by mouth daily.    . divalproex (DEPAKOTE) 500 MG DR tablet Take 1,500 mg by mouth at bedtime.    . DULoxetine (CYMBALTA) 30 MG capsule Take 1 capsule (30 mg total) by mouth at bedtime. 30 capsule 11  . Ferrous Sulfate (IRON) 325 (65 FE) MG TABS Take 1 tablet by mouth daily.    Marland Kitchen gabapentin (NEURONTIN) 600 MG tablet     . ibuprofen (ADVIL,MOTRIN) 800 MG tablet     . insulin aspart (NOVOLOG) 100 UNIT/ML injection Inject 2-15 Units into the skin 3 (three) times daily before meals. CBG < 70: implement hypoglycemia protocol  CBG 70 - 120: 0 units  CBG 121 - 150: 2 units  CBG 151 - 200: 3 units  CBG 201 - 250: 5 units  CBG 251 - 300: 7 units  CBG 301 - 350: 10 units  CBG 351 - 400: 12 units CBG >400:  15 Units 3 vial 11  . Insulin Glargine (TOUJEO SOLOSTAR) 300 UNIT/ML SOPN Inject 40 Units into the skin at bedtime.    Marland Kitchen lisinopril (PRINIVIL,ZESTRIL) 2.5 MG tablet Take 2.5 mg by mouth daily.    Marland Kitchen losartan (COZAAR) 25 MG tablet Take by mouth.    . Lutein 10 MG TABS Take 10 mg by mouth daily.    . metFORMIN (GLUCOPHAGE) 1000 MG tablet Take 1 tablet (1,000 mg total) by mouth 2 (two) times daily with a meal. 180 tablet  3  . omega-3 acid ethyl esters (LOVAZA) 1 G capsule Take 2 capsules (2 g total) by mouth 2 (two) times daily. (Patient taking differently: Take 4 g by mouth 2 (two) times daily. ) 180 capsule 3  . oxyCODONE (OXY IR/ROXICODONE) 5 MG immediate release tablet Take 1 tablet (5 mg total) by mouth every 3 (three) hours as needed for breakthrough pain. 20 tablet 0  . rosuvastatin (CRESTOR) 5 MG tablet Take 1 tablet (5 mg total) by mouth daily. 30 tablet 11    Allergies: No Known Allergies  ROS: As per HPI. A full 14-point review  of systems was limited by the patient's agitation and encephalopathy. He keeps asking to be "let out of here" but is not endorsing any particular complaints.   PE:  BP 103/65 (BP Location: Right Arm)   Pulse 108   Temp 97.7 F (36.5 C)   Resp 23   SpO2 100%   General: WD disheveled appearing male. He is agitated, at times shouting and swearing, not complying with the exam which is thus limited largely to observation. He is alert. He does not answer orientation questions. He has mild to moderate dysarthria. No overt aphasia.   HEENT: Normocephalic. Neck supple. Teeth stained brown. Sclerae anicteric. No conjunctival injection.  CV: Tachy, regular, no murmur. Carotid pulses full and symmetric, no bruits. Distal pulses 2+ and symmetric.  Lungs: CTAB.  Abdomen: Soft, non-distended.   Neuro:  CN: Pupils are equal and round. They are symmetrically reactive from 3-->2 mm. He appears to blink to visual threat from all quadrants. He has full volitional saccades. No nystagmus noted. Face is symmetric at rest with normal strength and mobility. Hearing is intact to conversational voice. The remainder of the cranial nerves cannot be accurately assessed as he does not participate with the exam. Motor: Normal bulk and tone. He does not participate with confrontational strength testing but moves all four extremities well with at least 4/5 strength. He has an intermittent coarse tremor of  BUE.   Sensation: He withdraws from mild noxious stimuli x4.  DTRs: 2+, symmetric in the arms and knees, absent at the ankles.  Coordination: No overt dysmetria noted with spontaneous movement. He does not participate with formal testing.  Gait: Deferred at this time due to agitation.   Labs:  Lab Results  Component Value Date   WBC 8.6 10/12/2016   HGB 16.3 10/12/2016   HCT 48.0 10/12/2016   PLT 147 (L) 10/12/2016   GLUCOSE 549 (HH) 10/12/2016   CHOL 105 08/03/2013   TRIG 252 (H) 08/03/2013   HDL 31 (L) 08/03/2013   LDLCALC 24 08/03/2013   ALT 12 08/03/2013   AST 14 08/03/2013   NA 131 (L) 10/12/2016   K 5.1 10/12/2016   CL 94 (L) 10/12/2016   CREATININE 1.00 10/12/2016   BUN 28 (H) 10/12/2016   CO2 26 09/29/2015   TSH 1.446 06/15/2011   PSA 0.73 04/15/2009   INR 1.01 10/12/2016   HGBA1C 11.4 (H) 09/25/2015   MICROALBUR 17.00 (H) 04/22/2013    Imaging:  I have personally and independently reviewed the [facility] without contrast from today. This is somewhat limited by patient motion. There is moderate diffuse generalized atrophy with hydrocephalus ex-vacuo. Moderate chronic small vessel ischemic disease is present in the white matter of both cerebral hemispheres and in the pons. There is atherosclerotic calcification of both carotids. No obvious acute abnormality is seen. This was discussed by phone with the interpreting neuroradiologist at the time of the consult.   Assessment and Plan:  1. Acute encephalopathy: His presentation is most suggestive of a toxic-metabolic encephalopathy with agitation, confusion, tremor, and no focal neurologic deficit. This is superimposed upon poor cerebral reserve which makes him susceptible to encephalopathy from all causes. His blood glucose is greater than 500 which may be contributing. He is mildly hyponatremic and uremic as well. Serum ethanol is negative. Urine drug screen is pending. While stroke cannot be fully excluded, the absence of focal  neurologic signs makes this less likely. However, thalamic and parietal lobe infarcts can present as confusion and encephalopathy. Gradually  correct hyperglycemia, avoiding drastic reduction to minimize risk of osmotic demyelination. Correct sodium, gently hydrate. Recommend nicotine patch to mitigate withdrawal symptoms--he apparently uses large amounts of smokeless tobacco per EMS. Chart only mentions occasional EtOH abuse, will need to watch for EtOH withdrawal as well. Recommend MRI brain without contrast to exclude stroke or other structural lesion. If MRI shows acute ischemia can proceed with further stroke workup at that time. Minimize the use of opiates, benzos (unless needed to treat EtOH withdrawal) or any medication with strong anticholinergic properties as much as possible. Optimize sleep-wake cycles as much as you can by keeping the room bright with activity during the day and dark and quiet at night. For agitation, recommend low-dose haloperidol or an atypical antipsychotic.   2. Tremor: He has intermittent symmetric tremor of both hands on his exam. Unclear if this is new or not. Tremor can be seen in the setting of hyperglycemia. Given its symmetry, this is not likely due to stroke.   3. Cerebrovascular disease: He has chronic small vessel disease on his CT scan. Known risk factors for cerebrovascular disease include DM, HTN, hyperlipidemia, tobacco abuse. Unclear if he is taking aspirin and statin at home--they are listed in his records but unsure of compliance at this time. Continue aspirin 81 mg daily and start atorvastatin 40 mg daily. Tobacco cessation. Gradually tighten glucose and BP control in the acute setting. Goal LDL <70, goal SBP <140, goal normoglycemia for longterm secondary prevention.   No family present at the bedside.   Thank you for this consultation. Neurology will continue to follow. Please call with any urgent questions or concerns.

## 2016-10-13 ENCOUNTER — Inpatient Hospital Stay (HOSPITAL_COMMUNITY)
Admit: 2016-10-13 | Discharge: 2016-10-13 | Disposition: A | Discharge status: Home / Self Care | Payer: Medicare HMO | Attending: Neurology | Admitting: Neurology

## 2016-10-13 ENCOUNTER — Encounter (HOSPITAL_COMMUNITY): Payer: Self-pay | Admitting: Nurse Practitioner

## 2016-10-13 DIAGNOSIS — G9341 Metabolic encephalopathy: Principal | ICD-10-CM

## 2016-10-13 DIAGNOSIS — E1142 Type 2 diabetes mellitus with diabetic polyneuropathy: Secondary | ICD-10-CM

## 2016-10-13 LAB — GLUCOSE, CAPILLARY
GLUCOSE-CAPILLARY: 300 mg/dL — AB (ref 65–99)
GLUCOSE-CAPILLARY: 169 mg/dL — AB (ref 65–99)
Glucose-Capillary: 212 mg/dL — ABNORMAL HIGH (ref 65–99)
Glucose-Capillary: 263 mg/dL — ABNORMAL HIGH (ref 65–99)

## 2016-10-13 LAB — CBC WITH DIFFERENTIAL/PLATELET
BASOS ABS: 0 10*3/uL (ref 0.0–0.1)
BASOS PCT: 0 %
EOS ABS: 0 10*3/uL (ref 0.0–0.7)
Eosinophils Relative: 0 %
HEMATOCRIT: 37.9 % — AB (ref 39.0–52.0)
Hemoglobin: 13.2 g/dL (ref 13.0–17.0)
Lymphocytes Relative: 35 %
Lymphs Abs: 3.6 10*3/uL (ref 0.7–4.0)
MCH: 30 pg (ref 26.0–34.0)
MCHC: 34.8 g/dL (ref 30.0–36.0)
MCV: 86.1 fL (ref 78.0–100.0)
MONO ABS: 0.6 10*3/uL (ref 0.1–1.0)
Monocytes Relative: 6 %
NEUTROS ABS: 6.1 10*3/uL (ref 1.7–7.7)
NEUTROS PCT: 59 %
Platelets: 150 10*3/uL (ref 150–400)
RBC: 4.4 MIL/uL (ref 4.22–5.81)
RDW: 12.1 % (ref 11.5–15.5)
WBC: 10.4 10*3/uL (ref 4.0–10.5)

## 2016-10-13 LAB — COMPREHENSIVE METABOLIC PANEL
ALBUMIN: 3.7 g/dL (ref 3.5–5.0)
ALT: 15 U/L — ABNORMAL LOW (ref 17–63)
ANION GAP: 10 (ref 5–15)
AST: 20 U/L (ref 15–41)
Alkaline Phosphatase: 50 U/L (ref 38–126)
BILIRUBIN TOTAL: 1.1 mg/dL (ref 0.3–1.2)
BUN: 22 mg/dL — AB (ref 6–20)
CHLORIDE: 102 mmol/L (ref 101–111)
CO2: 24 mmol/L (ref 22–32)
Calcium: 8.9 mg/dL (ref 8.9–10.3)
Creatinine, Ser: 1.14 mg/dL (ref 0.61–1.24)
GFR calc Af Amer: >60 mL/min (ref >60)
GFR calc non Af Amer: >60 mL/min (ref >60)
GLUCOSE: 225 mg/dL — AB (ref 65–99)
POTASSIUM: 4 mmol/L (ref 3.5–5.1)
SODIUM: 136 mmol/L (ref 135–145)
Total Protein: 6.3 g/dL — ABNORMAL LOW (ref 6.5–8.1)

## 2016-10-13 LAB — MAGNESIUM: Magnesium: 2.1 mg/dL (ref 1.7–2.4)

## 2016-10-13 LAB — PHOSPHORUS: Phosphorus: 3.4 mg/dL (ref 2.5–4.6)

## 2016-10-13 MED ORDER — ALBUTEROL SULFATE HFA 108 (90 BASE) MCG/ACT IN AERS: 2.0000 | INHALATION_SPRAY | Freq: Four times a day (QID) | RESPIRATORY_TRACT | Status: DC | PRN

## 2016-10-13 MED ORDER — DULOXETINE HCL 30 MG PO CPEP
30.0000 mg | ORAL_CAPSULE | Freq: Every day | ORAL | Status: DC
  Administered 2016-10-13: 30 mg via ORAL
  Filled 2016-10-13: qty 1

## 2016-10-13 MED ORDER — DIVALPROEX SODIUM 500 MG PO DR TAB
1250.0000 mg | DELAYED_RELEASE_TABLET | Freq: Every day | ORAL | Status: DC
  Administered 2016-10-13: 1250 mg via ORAL
  Filled 2016-10-13: qty 2

## 2016-10-13 MED ORDER — SODIUM CHLORIDE 0.9 % IV SOLN
INTRAVENOUS | Status: DC
  Administered 2016-10-13 – 2016-10-14 (×3): via INTRAVENOUS

## 2016-10-13 MED ORDER — GABAPENTIN 400 MG PO CAPS
400.0000 mg | ORAL_CAPSULE | Freq: Two times a day (BID) | ORAL | Status: DC
  Administered 2016-10-13: 400 mg via ORAL
  Filled 2016-10-13: qty 1

## 2016-10-13 MED ORDER — GABAPENTIN 400 MG PO CAPS: 1200.0000 mg | ORAL_CAPSULE | Freq: Every day | ORAL | Status: DC

## 2016-10-13 MED ORDER — INSULIN GLARGINE 100 UNIT/ML ~~LOC~~ SOLN
20.0000 [IU] | Freq: Two times a day (BID) | SUBCUTANEOUS | Status: DC
  Administered 2016-10-13 (×2): 20 [IU] via SUBCUTANEOUS
  Filled 2016-10-13 (×3): qty 0.2

## 2016-10-13 MED ORDER — FERROUS SULFATE 325 (65 FE) MG PO TABS
325.0000 mg | ORAL_TABLET | Freq: Every day | ORAL | Status: DC
  Administered 2016-10-13 – 2016-10-14 (×2): 325 mg via ORAL
  Filled 2016-10-13 (×2): qty 1

## 2016-10-13 MED ORDER — ALBUTEROL SULFATE (2.5 MG/3ML) 0.083% IN NEBU: 2.5000 mg | INHALATION_SOLUTION | Freq: Four times a day (QID) | RESPIRATORY_TRACT | Status: DC | PRN

## 2016-10-13 MED ORDER — IRON 325 (65 FE) MG PO TABS: 1.0000 | ORAL_TABLET | Freq: Every day | ORAL | Status: DC

## 2016-10-13 NOTE — Procedures (Signed)
Electroencephalogram (EEG) Report  Date of study: 10/13/16  Requesting clinician: Melba Coon, MD  Reason for study: Evaluate for seizure  Brief clinical history: This is a 76-yo man admitted for acute confusion. EEG is performed for further evaluation.   Medications:  Current Facility-Administered Medications:  .  0.9 %  sodium chloride infusion, , Intravenous, Continuous, Lavina Hamman, MD, Last Rate: 125 mL/hr at 10/13/16 1452 .  acetaminophen (TYLENOL) tablet 650 mg, 650 mg, Oral, Q6H PRN **OR** acetaminophen (TYLENOL) suppository 650 mg, 650 mg, Rectal, Q6H PRN, Omair Latif Sheikh, DO .  albuterol (PROVENTIL) (2.5 MG/3ML) 0.083% nebulizer solution 2.5 mg, 2.5 mg, Nebulization, Q6H PRN, Lavina Hamman, MD .  aspirin chewable tablet 81 mg, 81 mg, Oral, Daily, [employer], DO, 81 mg at 10/13/16 1508 .  divalproex (DEPAKOTE) DR tablet 1,250 mg, 1,250 mg, Oral, QHS, Lavina Hamman, MD .  DULoxetine (CYMBALTA) DR capsule 30 mg, 30 mg, Oral, QHS, Lavina Hamman, MD .  enoxaparin (LOVENOX) injection 55 mg, 55 mg, Subcutaneous, Q24H, Omair Latif Sheikh, DO, 55 mg at 10/12/16 2236 .  [START ON 10/14/2016] ferrous sulfate tablet 325 mg, 325 mg, Oral, Q breakfast, Lavina Hamman, MD, 325 mg at 10/13/16 1508 .  gabapentin (NEURONTIN) capsule 400 mg, 400 mg, Oral, BID, Lavina Hamman, MD, 400 mg at 10/13/16 1651 .  insulin aspart (novoLOG) injection 0-15 Units, 0-15 Units, Subcutaneous, TID WC, Omair Latif Sheikh, DO, 3 Units at 10/13/16 1200 .  insulin glargine (LANTUS) injection 20 Units, 20 Units, Subcutaneous, BID, Lavina Hamman, MD, 20 Units at 10/13/16 1000 .  rosuvastatin (CRESTOR) tablet 5 mg, 5 mg, Oral, Daily, Omair Latif Sheikh, DO, 5 mg at 10/13/16 1508 .  senna-docusate (Senokot-S) tablet 1 tablet, 1 tablet, Oral, QHS PRN, Bertram Savin Sheikh, DO .  traMADol (ULTRAM) tablet 50 mg, 50 mg, Oral, Q6H PRN, Kerney Elbe, DO  Description: This is a routine EEG performed  using standard international 10-20 electrode placement. A total of 18 channels are recorded, including one for the EKG. Wakefulness and drowsiness are recorded.   Activating Maneuvers: None  Findings:  The EKG channel demonstrates a regular rhythm with a rate of 90 beats per minute.   The background consists of mild diffuse slowing. The average frequency is 6-7 Hz.  The best dominant posterior rhythm is 7 Hz. This is symmetric and reacts as expected with eye opening. There is a mild degree is intermixed delta throughout the recording.   There are no focal asymmetries. No epileptiform discharges are present. No seizures are recorded.   Drowsiness is recorded and is normal in appearance.   Impression: This EEG is abnormal due to mild diffuse generalized slowing. This is nonspecific in nature and suggest a mild global encephalopathic process. There are no seizures on this recording. No epileptiform activity is present to suggest a propensity for seizure.    Melba Coon, MD [facility]

## 2016-10-13 NOTE — Progress Notes (Signed)
[facility] Progress Note  Patient: Caleb Joseph [id number]   PCP: Chesley Noon, MD DOB: 11-Jul-1956   DOA: 10/12/2016   DOS: 10/13/2016   Date of Service: the patient was seen and examined on 10/13/2016  Brief hospital course: Pt. 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 Currently further plan is continue close monitoring.  Assessment and Plan: 1. Acute metabolic encephalopathy. Likely toxic in nature. Neurology was consulted. Appreciate input. Next and MRI brain negative for any acute stroke. Patient appears to be close to his baseline. Continues to have significant orthostasis. EEG currently pending. I would recommend to reduce patient's gabapentin, in the chart the patient is a 6 month milligrams twice a day and the patient mentions that he is taking 800 mg twice a day, I would reduce it to 400 mg twice a day. PTOT consult pending.  2. Orthostasis. Continue IV hydration. Hold antihypertensive medication. Decrease gabapentin to spread  3. History of bipolar disorder. Patient is on Depakote for mood stabilization. Continue Cymbalta. Ammonia level 21, Depakote level significantly elevated, no asterixis noted. I would reduce Depakote 1500 mg 1250 mg.  4. Type 2 diabetes mellitus. Check hemoglobin A1c. Blood sugars are currently uncontrolled. Mild elevation of beta hydroxybutyrate acid as well. Patient received IV hydration. No anion gap. Currently we will resume patient's home insulin regimen. Although the speech therapy has cleared the patient for diet I would transition back to home regimen over next 48 hours.  Pain management: When necessary tramadol Activity: Consulted physical therapy Bowel regimen: last BM 10/11/2016 Diet: Carb modified diet DVT Prophylaxis:  subcutaneous Heparin  Advance goals of care discussion: Full code  Family Communication: no family was present at bedside, at the time of interview.  Disposition:  Discharge to home with home health. Expected discharge date: 10/14/2016, stabilization of above parameters  Consultants: Neurology Procedures: EEG  Antibiotics: Anti-infectives    None        Subjective: Denies any acute complaint, complains about fatigue which is chronic. No headache or dizziness.  Objective: Physical Exam: Vitals:   10/13/16 1023 10/13/16 1115 10/13/16 1315 10/13/16 1510  BP: 109/62  126/78 137/74  Pulse: 100  83 84  Resp: 18 18 18 18   Temp: 98.2 F (36.8 C) 98.3 F (36.8 C) 98 F (36.7 C) 98.3 F (36.8 C)  TempSrc: Oral Oral Oral Oral  SpO2: 99%  99% 100%  Weight:        Intake/Output Summary (Last 24 hours) at 10/13/16 1520 Last data filed at 10/12/16 1924  Gross per 24 hour  Intake              500 ml  Output                0 ml  Net              500 ml   Filed Weights   10/12/16 2000 10/13/16 0523  Weight: 106.2 kg (234 lb 3.2 oz) 109.4 kg (241 lb 1.6 oz)    General: Alert, Awake and Oriented to Time, Place and Person. Appear in mild distress, affect appropriate Eyes: PERRL, Conjunctiva normal ENT: Oral Mucosa clear moist. Neck: difficult to assess  JVD, no Abnormal Mass Or lumps Cardiovascular: S1 and S2 Present, no Murmur, Respiratory: Bilateral Air entry equal and Decreased, no use of accessory  muscle, Clear to Auscultation, no Crackles, no wheezes Abdomen: Bowel Sound present, Soft and no tenderness Skin: no redness, no Rash, no induration Extremities: no Pedal edema, no calf tenderness Neurologic: Grossly no focal neuro deficit. Bilaterally Equal motor strength  Data Reviewed: CBC:  Recent Labs Lab 10/12/16 1530 10/12/16 1541 10/13/16 0425  WBC 8.6  --  10.4  NEUTROABS 5.7  --  6.1  HGB 15.5 16.3 13.2  HCT 43.2 48.0 37.9*  MCV 86.2  --  86.1  PLT 147*  --   [id number]   Basic Metabolic Panel:  Recent Labs Lab 10/12/16 1530 10/12/16 1541 10/13/16 0425  NA 130* 131* 136  K 5.1 5.1 4.0  CL 94* 94* 102  CO2 20*  --  24  GLUCOSE 543* 549* 225*  BUN 22* 28* 22*  CREATININE 1.33* 1.00 1.14  CALCIUM 9.9  --  8.9  MG  --   --  2.1  PHOS  --   --  3.4    Liver Function Tests:  Recent Labs Lab 10/12/16 1530 10/13/16 0425  AST 30 20  ALT 20 15*  ALKPHOS 63 50  BILITOT 1.4* 1.1  PROT 7.9 6.3*  ALBUMIN 4.7 3.7   No results for input(s): LIPASE, AMYLASE in the last 168 hours.  Recent Labs Lab 10/12/16 1830  AMMONIA 21   Coagulation Profile:  Recent Labs Lab 10/12/16 1530  INR 1.01   Cardiac Enzymes: No results for input(s): CKTOTAL, CKMB, CKMBINDEX, TROPONINI in the last 168 hours. BNP (last 3 results) No results for input(s): PROBNP in the last 8760 hours.  CBG:  Recent Labs Lab 10/12/16 1836 10/12/16 1922 10/12/16 2112 10/13/16 0641 10/13/16 1124  GLUCAP 385* 272* 278* 300* 169*    Studies: Dg Chest Portable 1 View  Result Date: 10/12/2016 CLINICAL DATA:  Altered mental status, combative. EXAM: PORTABLE CHEST 1 VIEW COMPARISON:  Chest radiograph March 21, 2015 FINDINGS: The heart size and mediastinal contours are within normal limits. Both lungs are clear. The visualized skeletal structures are unremarkable. IMPRESSION: No acute cardiopulmonary process. Electronically Signed   By: Elon Alas M.D.   On: 10/12/2016 17:45     Scheduled Meds: . aspirin  81 mg Oral Daily  . divalproex  1,500 mg Oral QHS  . DULoxetine  30 mg Oral QHS  . enoxaparin (LOVENOX) injection  55 mg Subcutaneous Q24H  . [START ON 10/14/2016] ferrous sulfate  325 mg Oral Q breakfast  . gabapentin  400 mg Oral BID  . insulin aspart  0-15 Units Subcutaneous TID WC  . insulin glargine  20 Units Subcutaneous BID  . nicotine  14 mg Transdermal Once  . rosuvastatin  5 mg Oral Daily   Continuous Infusions: . sodium chloride 125 mL/hr at  10/13/16 1452   PRN Meds: acetaminophen **OR** acetaminophen, albuterol, senna-docusate, traMADol  Time spent: 30 minutes  Author: Berle Mull, MD [facility] Pager: [phone] 10/13/2016 3:20 PM  If 7PM-7AM, please contact night-coverage at [email or web address], password [facility]
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
## 2016-10-13 NOTE — Progress Notes (Signed)
Routine EEG completed, results pending. 

## 2016-10-13 NOTE — Evaluation (Signed)
Clinical/Bedside Swallow Evaluation Patient Details  Name: Caleb Joseph MRN: [id number] Date of Birth: 01/19/1956  Today's Date: 10/13/2016 Time: SLP Start Time (ACUTE ONLY): [id number] SLP Stop Time (ACUTE ONLY): 1500 SLP Time Calculation (min) (ACUTE ONLY): 15 min  Past Medical History:  Past Medical History:  Diagnosis Date  . Abscess of prostate 02/17/2009  . Diabetes mellitus   . Diabetic foot infection ([place]) 12/26/2011  . 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 HYPERLIPIDEMIA 08/26/2007  . Hypertension   . 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 AMPUTATION  12/30/2011   Procedure: AMPUTATION DIGIT;  Surgeon: Marybelle Killings, MD;  Location: [facility];  Service: Orthopedics;  Laterality: Left;  Left Great  . AMPUTATION  03/20/2012   Procedure: AMPUTATION DIGIT;  Surgeon: Mcarthur Rossetti, MD;  Location: [facility];  Service: Orthopedics;  Laterality: Left;  Left foot 3rd toe amputation  . CARDIAC CATHETERIZATION    . I&D EXTREMITY Left 09/28/2015   Procedure: IRRIGATION AND DRAINAGE OF LEFT FOOT with second and third metatarsal amputation;  Surgeon: Mcarthur Rossetti, MD;  Location: [facility];  Service: Orthopedics;  Laterality: Left;  . TOE AMPUTATION  08/2009   R little toe   HPI:  Patient is a 60 y.o. male who was admitted with stroke symptoms of confusion, facial droop and aphasia. CT Head did not reveal an acute infarct.  PMH: HTN, DM-2, Depression, Anemia.   Assessment / Plan / Recommendation Clinical Impression  Patient presents with an oropharyngeal swallow that is within normal limits for all consistencies tested (thin liquids via straw sips, hard solids, puree solids). Patient did not exhibit any overt s/s of aspiration and vocal quality remained clear throughout. Patient was able to safely and effectively feed himself without any assistance.    Aspiration Risk  No limitations     Diet Recommendation Regular;Thin liquid   Liquid Administration via: Cup;Straw Medication Administration: Whole meds with liquid Supervision: Patient able to self feed    Other  Recommendations Oral Care Recommendations: Oral care BID   Follow up Recommendations None      Frequency and Duration     N/A       Prognosis        Swallow Study   General Date of Onset: 10/12/16 HPI: Patient is a 60 y.o. male who was admitted with stroke symptoms of confusion, facial droop and aphasia. CT Head did not reveal an acute infarct.  PMH: HTN, DM-2, Depression, Anemia. Type of Study: Bedside Swallow Evaluation Previous Swallow Assessment: N/A Diet Prior to this Study: NPO Temperature Spikes Noted: No Respiratory Status: Room air History of Recent Intubation: No Behavior/Cognition: Alert;Cooperative;Pleasant mood Oral Cavity Assessment: Within Functional Limits Oral Cavity - Dentition: Poor condition Vision: Functional for self-feeding Self-Feeding Abilities: Able to feed self Patient Positioning: Upright in bed Baseline Vocal Quality: Normal Volitional Cough: Strong Volitional Swallow: Able to elicit    Oral/Motor/Sensory Function Overall Oral Motor/Sensory Function: Within functional limits   Ice Chips     Thin Liquid Thin Liquid: Within functional limits Presentation: Cup;Straw Other Comments: No overt s/s aspiration    Nectar Thick     Honey Thick     Puree Puree: Within functional limits Presentation: Self Fed;Spoon   Solid   GO   Solid: Within functional limits Presentation: [place], MA, CCC-SLP 10/13/16 3:20 PM

## 2016-10-14 LAB — BASIC METABOLIC PANEL
ANION GAP: 9 (ref 5–15)
BUN: 20 mg/dL (ref 6–20)
CALCIUM: 8.2 mg/dL — AB (ref 8.9–10.3)
CO2: 22 mmol/L (ref 22–32)
Chloride: 103 mmol/L (ref 101–111)
Creatinine, Ser: 1.08 mg/dL (ref 0.61–1.24)
GLUCOSE: 222 mg/dL — AB (ref 65–99)
POTASSIUM: 3.9 mmol/L (ref 3.5–5.1)
SODIUM: 134 mmol/L — AB (ref 135–145)

## 2016-10-14 LAB — URINE CULTURE

## 2016-10-14 LAB — GLUCOSE, CAPILLARY
GLUCOSE-CAPILLARY: 223 mg/dL — AB (ref 65–99)
GLUCOSE-CAPILLARY: 159 mg/dL — AB (ref 65–99)
Glucose-Capillary: 213 mg/dL — ABNORMAL HIGH (ref 65–99)

## 2016-10-14 LAB — CBC
HCT: 37.7 % — ABNORMAL LOW (ref 39.0–52.0)
HEMOGLOBIN: 12.7 g/dL — AB (ref 13.0–17.0)
MCH: 29.7 pg (ref 26.0–34.0)
MCHC: 33.7 g/dL (ref 30.0–36.0)
MCV: 88.1 fL (ref 78.0–100.0)
Platelets: 122 10*3/uL — ABNORMAL LOW (ref 150–400)
RBC: 4.28 MIL/uL (ref 4.22–5.81)
RDW: 12.2 % (ref 11.5–15.5)
WBC: 6.8 10*3/uL (ref 4.0–10.5)

## 2016-10-14 MED ORDER — SENNOSIDES-DOCUSATE SODIUM 8.6-50 MG PO TABS: 1.0000 | ORAL_TABLET | Freq: Every evening | ORAL | 0 refills | Status: DC | PRN

## 2016-10-14 MED ORDER — FERROUS SULFATE 325 (65 FE) MG PO TABS: 325.0000 mg | ORAL_TABLET | Freq: Every day | ORAL | 0 refills | Status: AC

## 2016-10-14 MED ORDER — INSULIN GLARGINE 100 UNIT/ML ~~LOC~~ SOLN
40.0000 [IU] | Freq: Every day | SUBCUTANEOUS | Status: DC
  Filled 2016-10-14: qty 0.4

## 2016-10-14 MED ORDER — DIVALPROEX SODIUM 500 MG PO DR TAB: DELAYED_RELEASE_TABLET | ORAL | 0 refills | Status: DC

## 2016-10-14 MED ORDER — DIVALPROEX SODIUM ER 250 MG PO TB24: ORAL_TABLET | ORAL | 0 refills | Status: DC

## 2016-10-14 NOTE — Progress Notes (Signed)
Pt discharging at time taking all personal belongings. IV discontinued,dry dressing applied. Discharge instructions provided with verbal understanding. Pt to follow up with Md per discharge summary. No noted distress.

## 2016-10-14 NOTE — Evaluation (Signed)
Physical Therapy Evaluation Patient Details Name: Caleb Joseph MRN: [id number] DOB: 07/14/1956 Today's Date: 10/14/2016   History of Present Illness  Patient is a 60 yo male admitted 10/12/16 with ataxia and acute encephalopathy.  MRI of head negative.    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  Patient with slightly decreased balance during gait.  Encouraged patient to use cane at all times for gait.  Recommend f/u HHPT for continued gait/balance training.  Patient ready for d/c from PT perspective.    Follow Up Recommendations Home health PT;Supervision - Intermittent    Equipment Recommendations  None recommended by PT    Recommendations for Other Services       Precautions / Restrictions Precautions Precautions: Fall Precaution Comments: Was agitated on admission Restrictions Weight Bearing Restrictions: No      Mobility  Bed Mobility Overal bed mobility: Modified Independent                Transfers Overall transfer level: Modified independent Equipment used: None                Ambulation/Gait Ambulation/Gait assistance: Supervision Ambulation Distance (Feet): 200 Feet Assistive device: None (Pushing IV pole - simulate cane) Gait Pattern/deviations: Step-through pattern;Decreased stride length;Decreased dorsiflexion - right;Shuffle;Drifts right/left Gait velocity: decreased Gait velocity interpretation: Below normal speed for age/gender General Gait Details: Patient with somewhat unsteady gait, to both sides.  No loss of balance.  Able to correct with use of hand rail.   Stairs            Wheelchair Mobility    Modified Rankin (Stroke Patients Only) Modified Rankin (Stroke Patients Only) Pre-Morbid Rankin Score: No significant disability Modified Rankin: Slight disability     Balance Overall balance  assessment: Needs assistance         Standing balance support: Single extremity supported;During functional activity Standing balance-Leahy Scale: Poor Standing balance comment: Requires UE support for safe balance with gait.                             Pertinent Vitals/Pain Pain Assessment: No/denies pain    Home Living Family/patient expects to be discharged to:: Private residence Living Arrangements: Alone Available Help at Discharge: Friend(s);Available PRN/intermittently Type of Home: Mobile home Home Access: Stairs to enter Entrance Stairs-Rails: Right;Left Entrance Stairs-Number of Steps: 7 Home Layout: One level Home Equipment: Cane - single point      Prior Function Level of Independence: Independent         Comments: Uses cane only occasionally.  Drives     Hand Dominance        Extremity/Trunk Assessment   Upper Extremity Assessment: LUE deficits/detail;RUE deficits/detail RUE Deficits / Details: Decreased sensation lateral portion of hand   RUE Sensation: history of peripheral neuropathy LUE Deficits / Details: Noted tremors fairly consistently.  Decreased sensation lateral portion of hand   Lower Extremity Assessment: Generalized weakness;LLE deficits/detail;RLE deficits/detail RLE Deficits / Details: Decreased sensation, h/o toes amputated LLE Deficits / Details: Noted tremors at times, decreased sensation     Communication   Communication: No difficulties  Cognition Arousal/Alertness: Awake/alert Behavior During Therapy: Restless Overall Cognitive Status: Within Functional Limits for tasks assessed (At baseline)                      General Comments  Exercises     Assessment/Plan    PT Assessment All further PT needs can be met in the next venue of care  PT Problem List Decreased strength;Decreased balance;Decreased mobility;Decreased coordination;Impaired sensation          PT Treatment Interventions       PT Goals (Current goals can be found in the Care Plan section)  Acute Rehab PT Goals PT Goal Formulation: All assessment and education complete, DC therapy    Frequency     Barriers to discharge        Co-evaluation               End of Session Equipment Utilized During Treatment: Gait belt Activity Tolerance: Patient tolerated treatment well Patient left: in bed;with call bell/phone within reach;with nursing/sitter in room (sitting EOB for dinner) Nurse Communication: Mobility status (Recommend HHPT)         Time: 5146-0479 PT Time Calculation (min) (ACUTE ONLY): 18 min   Charges:   PT Evaluation $PT Eval Moderate Complexity: 1 Procedure     PT G Codes:        Despina Pole 2016-10-18, 8:30 PM Carita Pian. Sanjuana Kava, [place] Pager [phone]

## 2016-10-14 NOTE — Progress Notes (Signed)
CM met with pt in room to offer choice of home health agency. Pt chooses [facility] to render HHPT.  Referral given to [facility] rep, Jermaine.  Pt states he uses a cane at home and does not do well with rolling walker and denies need for additional DMe. No other Cm needs were communicated.

## 2016-10-15 ENCOUNTER — Telehealth: Payer: Self-pay | Admitting: Internal Medicine

## 2016-10-15 DIAGNOSIS — G9341 Metabolic encephalopathy: Secondary | ICD-10-CM | POA: Diagnosis present

## 2016-10-15 DIAGNOSIS — Z8639 Personal history of other endocrine, nutritional and metabolic disease: Secondary | ICD-10-CM

## 2016-10-15 MED ORDER — GABAPENTIN 400 MG PO CAPS: 400.0000 mg | ORAL_CAPSULE | Freq: Two times a day (BID) | ORAL | 0 refills | Status: DC

## 2016-10-15 NOTE — Discharge Summary (Signed)
[facility] Discharge Summary   Patient: Caleb Joseph [id number]   PCP: Chesley Noon, MD DOB: 12-10-1955   Date of admission: 10/12/2016   Date of discharge: 10/14/2016    Discharge Diagnoses:  Principal Problem:   Acute metabolic encephalopathy Active Problems:   Diabetic neuropathy ([place])   Essential hypertension   Type 2 diabetes mellitus ([place])  Admitted From: Home Disposition:  Home with home health  Recommendations for Outpatient Follow-up:  1. Follow-up with PCP in one week. 2. Follow-up with psychiatry in 1 week to adjust Depakote dose as well as recheck the levels.   Follow-up Information    BADGER,MICHAEL C, MD. Schedule an appointment as soon as possible for a visit in 1 week(s).   Specialty:  Family Medicine Contact information: [place] [address] [phone]        [facility] Schedule an appointment as soon as possible for a visit in 1 week(s).   Specialty:  [facility] information: [place] [address] [phone]        [facility] Follow up.   Why:  HHPT has been ordered by your MD. A representative will be in touch with you within 24-48 hours to make arrangements for the initiial visit.  Contact information: [place] [address] [place] .   Contact information: [address] [place] [address] [phone]          Diet recommendation: Diabetic diet  Activity: The patient is advised to gradually reintroduce usual activities.  Discharge Condition: good  Code Status: Full code  History of present illness: As per the H and P dictated on admission, "Caleb Joseph is a 60 y.o. male with medical history significant of Hypertension, Hyperlipidemia, Insulin Dependent Diabetes Mellitus Type 2 complicated by Diabetic Peripheral Neuropathy and Hx of DKA, and MRSA Bacteremia and Hx of  Diabetic Osteomyelitis, Depression, PVD, Anemia, Hx of Abscess of the Prostate and other comorbids who presented to [facility] ED after he started driving erratically and became confused with a facial droop and had some aphasia. Patient unable to provide a subjective History at this point and had no family present at bedside so most of the history was gathered from [facility]. Patient Had a Code Stroke and was evaluated by Neurology who thought it was more acute Toxic Metabolic Encephalopathy. Apparently on arrival patient was confused and agitated and swearing, and was given Lorazepam and was more somnolent and altered on my exam. Patient had no facial droop on my exam but had ripped of his gown and covered in Urine. When talking with the ED nurse, she stated patient did not have the best hygiene and had defecation stains in his underwear. When speaking to the patient he responds to name but is tremulous and does not cooperate with questioning and examination. Unable to tell me where he is a at this point but pulls the cover over himself and is somewhat combative. "  Hospital Course:  Summary of his active problems in the hospital is as following. 1. Acute metabolic encephalopathy. Likely toxic in nature. Neurology was consulted. Appreciate input. MRI brain negative for any acute stroke. Patient appears to be close to his baseline. EEG showing no acute abnormality I would recommend to reduce patient's gabapentin, in the chart the patient is a 600 Mg twice a day and the patient mentions  that he is taking 800 mg twice a day, I would reduce it to 400 mg twice a day. PTOT consult pending, although the patient is ambulatory and is requesting to go home. We will arrange home with home health  2. Orthostasis. Resolved after hydration dehydration from hyperglycemia. Decrease gabapentin to 400 mg twice a day  3. History of bipolar disorder. Patient is on Depakote for mood stabilization. Continue  Cymbalta. Ammonia level 21, Depakote level significantly elevated, 116. no asterixis noted. I would reduce Depakote 1500 mg 1250 mg.  4. Type 2 diabetes mellitus. Usually on admission suspected to be having mild DKA. Blood sugars are currently controlled Pt known to have him on compliance with medical regimen. No anion gap. Currently we will resume patient's home insulin regimen.  All other chronic medical condition were stable during the hospitalization.  Patient was ambulatory without any assistance. Home health was arranged by Education officer, museum and case Freight forwarder. On the day of the discharge the patient's vitals were stable, and no other acute medical condition were reported by patient. the patient was felt safe to be discharge at home with home health.  Procedures and Results:  EEG   Consultations:  Neurology  DISCHARGE MEDICATION: Discharge medication on admitted gabapentin, pharmacy called 400 mg twice a day gabapentin added.  Discharge Medication List as of 10/14/2016  6:15 PM    START taking these medications   Details  divalproex (DEPAKOTE ER) 250 MG 24 hr tablet Take 2 tablet of 500 mg and 1 tablet of 250 mg, total of 1250 mg every night., Normal    ferrous sulfate 325 (65 FE) MG tablet Take 1 tablet (325 mg total) by mouth daily with breakfast., Starting Mon 10/15/2016, Normal    senna-docusate (SENOKOT-S) 8.6-50 MG tablet Take 1 tablet by mouth at bedtime as needed for mild constipation., Starting Sun 10/14/2016, Normal      CONTINUE these medications which have CHANGED   Details  divalproex (DEPAKOTE) 500 MG DR tablet Take 2 tablet of 500 mg and 1 tablet of 250 mg, total of 1250 mg every night., Normal      CONTINUE these medications which have NOT CHANGED   Details  albuterol (PROVENTIL HFA;VENTOLIN HFA) 108 (90 BASE) MCG/ACT inhaler Inhale 2 puffs into the lungs every 6 (six) hours as needed. For cold symptoms, Starting Tue 12/16/2012, Print    aspirin 81 MG  tablet Take 1 tablet (81 mg total) by mouth daily., Starting Tue 03/17/2013, OTC    cholecalciferol (VITAMIN D) 1000 UNITS tablet Take 1,000 Units by mouth daily., Starting Thu 06/23/2015, Historical Med    DULoxetine (CYMBALTA) 30 MG capsule Take 1 capsule (30 mg total) by mouth at bedtime., Starting Tue [id number], Print    folic acid (FOLVITE) Q000111Q MCG tablet Take 800 mcg by mouth daily. , Historical Med    ibuprofen (ADVIL,MOTRIN) 800 MG tablet Take 800 mg by mouth 2 (two) times daily as needed (pain). , Starting Mon 06/11/2016, Historical Med    insulin aspart (NOVOLOG FLEXPEN) 100 UNIT/ML FlexPen Inject 5-15 Units into the skin daily before lunch. Inject 5 units plus 0-10 units based on CBG - up to 15 units daily, Historical Med    Insulin Glargine (TOUJEO SOLOSTAR) 300 UNIT/ML SOPN Inject 40 Units into the skin at bedtime., Historical Med    Lutein 10 MG TABS Take 10 mg by mouth daily., Historical Med    metFORMIN (GLUCOPHAGE) 1000 MG tablet Take 1 tablet (1,000 mg total) by mouth 2 (two)  times daily with a meal., Starting Wed 04/22/2013, Print    omega-3 acid ethyl esters (LOVAZA) 1 G capsule Take 2 capsules (2 g total) by mouth 2 (two) times daily., Starting Mon 09/14/2013, Print    rosuvastatin (CRESTOR) 5 MG tablet Take 1 tablet (5 mg total) by mouth daily., Starting Tue 03/17/2013, Print    insulin aspart (NOVOLOG) 100 UNIT/ML injection Inject 2-15 Units into the skin 3 (three) times daily before meals. CBG < 70: implement hypoglycemia protocol  CBG 70 - 120: 0 units  CBG 121 - 150: 2 units  CBG 151 - 200: 3 units  CBG 201 - 250: 5 units  CBG 251 - 300: 7 units  CBG 301 - 350: 10  units  CBG 351 - 400: 12 units CBG >400:  15 Units, Starting Thu 09/03/2013, Print    oxyCODONE (OXY IR/ROXICODONE) 5 MG immediate release tablet Take 1 tablet (5 mg total) by mouth every 3 (three) hours as needed for breakthrough pain., Starting Thu 09/29/2015, Print      STOP taking these medications      gabapentin (NEURONTIN) 600 MG tablet        Allergies  Allergen Reactions  . Lisinopril Other (See Comments)    Caused upper arm and shoulder weakness  . Losartan Other (See Comments)    Caused upper arm and shoulder weakness    Discharge Exam: Filed Weights   10/12/16 2000 10/13/16 0523 10/14/16 0105  Weight: 106.2 kg (234 lb 3.2 oz) 109.4 kg (241 lb 1.6 oz) 110.3 kg (243 lb 1.6 oz)   Vitals:   10/14/16 1406 10/14/16 1611  BP: (!) 119/57 126/76  Pulse: 81 70  Resp: 18 18  Temp: 98 F (36.7 C) 98 F (36.7 C)   General: Appear in no distress, no Rash; Oral Mucosa moist. Cardiovascular: S1 and S2 Present, no Murmur, no JVD Respiratory: Bilateral Air entry present and Clear to Auscultation, no Crackles, no wheezes Abdomen: Bowel Sound present, Soft and no tenderness Extremities: no Pedal edema, no calf tenderness Neurology: Grossly no focal neuro deficit. Chronic tremors  The results of significant diagnostics from this hospitalization (including imaging, microbiology, ancillary and laboratory) are listed below for reference.    Significant Diagnostic Studies: Dg Chest Portable 1 View  Result Date: 10/12/2016 CLINICAL DATA:  Altered mental status, combative. EXAM: PORTABLE CHEST 1 VIEW COMPARISON:  Chest radiograph March 21, 2015 FINDINGS: The heart size and mediastinal contours are within normal limits. Both lungs are clear. The visualized skeletal structures are unremarkable. IMPRESSION: No acute cardiopulmonary process. Electronically Signed   By: Elon Alas M.D.   On: 10/12/2016 17:45   Ct Head Code Stroke W/o Cm  Result Date: 10/12/2016 CLINICAL DATA:  Code stroke.  Right-sided facial droop. EXAM: CT HEAD WITHOUT CONTRAST TECHNIQUE: Contiguous axial images were obtained from the base of the skull through the vertex without intravenous contrast. COMPARISON:  11/23/2009 CT head FINDINGS: Brain: No evidence of acute infarction, hemorrhage, hydrocephalus,  extra-axial collection or mass lesion/mass effect. Motion artifact. Moderate parenchymal volume loss for age. Pontine lucencies, appear present on prior CT, likely old lacunar infarct. Vascular: Calcific atherosclerosis of cavernous internal carotid arteries. No hyperdense vessel identified. Skull: Normal. Negative for fracture or focal lesion. Sinuses/Orbits: No acute finding. Other: None. ASPECTS [facility] Stroke Program Early CT Score) - Ganglionic level infarction (caudate, lentiform nuclei, internal capsule, insula, M1-M3 cortex): 7 - Supraganglionic infarction (M4-M6 cortex): 3 Total score (0-10 with 10 being normal): 10 IMPRESSION: 1. No  acute intracranial abnormality identified.  Motion artifact. 2. Moderate brain parenchymal volume loss for age. 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 3. ASPECTS is 10 These results were called by telephone at the time of interpretation on 10/12/2016 at 3:28 pm to Dr. Shon Hale, who verbally acknowledged these results. Electronically Signed   By: Kristine Garbe M.D.   On: 10/12/2016 15:32    Microbiology: Recent Results (from the past 240 hour(s))  Urine culture     Status: Abnormal   Collection Time: 10/12/16  5:17 PM  Result Value Ref Range Status   Specimen Description URINE, CATHETERIZED  Final   Special Requests NONE  Final   Culture <10,000 COLONIES/mL INSIGNIFICANT GROWTH (A)  Final   Report Status 10/14/2016 FINAL  Final     Labs: CBC:  Recent Labs Lab 10/12/16 1530 10/12/16 1541 10/13/16 0425 10/14/16 0521  WBC 8.6  --  10.4 6.8  NEUTROABS 5.7  --  6.1  --   HGB 15.5 16.3 13.2 12.7*  HCT 43.2 48.0 37.9* 37.7*  MCV 86.2  --  86.1 88.1  PLT 147*  --  150 [id number]*   Basic Metabolic Panel:  Recent Labs Lab 10/12/16 1530 10/12/16 1541 10/13/16 0425 10/14/16 0521  NA 130* 131* 136 134*  K 5.1 5.1 4.0 3.9  CL 94* 94* 102 103  CO2 20*  --  24 22  GLUCOSE 543* 549* 225* 222*  BUN 22* 28* 22* 20  CREATININE 1.33* 1.00  1.14 1.08  CALCIUM 9.9  --  8.9 8.2*  MG  --   --  2.1  --   PHOS  --   --  3.4  --    Liver Function Tests:  Recent Labs Lab 10/12/16 1530 10/13/16 0425  AST 30 20  ALT 20 15*  ALKPHOS 63 50  BILITOT 1.4* 1.1  PROT 7.9 6.3*  ALBUMIN 4.7 3.7   No results for input(s): LIPASE, AMYLASE in the last 168 hours.  Recent Labs Lab 10/12/16 1830  AMMONIA 21   Cardiac Enzymes: No results for input(s): CKTOTAL, CKMB, CKMBINDEX, TROPONINI in the last 168 hours. BNP (last 3 results) No results for input(s): BNP in the last 8760 hours. CBG:  Recent Labs Lab 10/13/16 1655 10/13/16 2101 10/14/16 0617 10/14/16 1150 10/14/16 1648  GLUCAP 212* 263* 223* 159* 213*   Time spent: 30 minutes  Signed:  Mads Borgmeyer  [facility] 10/14/2016 , 8:43 AM

## 2016-10-15 NOTE — Telephone Encounter (Signed)
[facility] PROGRESS NOTE  Patient: Caleb Joseph [id number]   PCP: Chesley Noon, MD DOB: [birth year]    DOS: 10/15/2016    Called patient's pharmacy and prescribed gabapentin 400 mg twice a day, a total of 800 mg dose, replacing 600 mg 2 tablets a total of 1200 mg dose at night.   Author: Berle Mull, MD [facility] 10/15/2016 9:05 AM

## 2016-10-25 ENCOUNTER — Ambulatory Visit (INDEPENDENT_AMBULATORY_CARE_PROVIDER_SITE_OTHER): Payer: Medicare HMO | Admitting: Podiatry

## 2016-10-25 ENCOUNTER — Encounter: Payer: Self-pay | Admitting: Podiatry

## 2016-10-25 VITALS — Ht 75.0 in | Wt 243.0 lb

## 2016-10-25 DIAGNOSIS — M79676 Pain in unspecified toe(s): Secondary | ICD-10-CM

## 2016-10-25 DIAGNOSIS — B351 Tinea unguium: Secondary | ICD-10-CM | POA: Diagnosis not present

## 2016-10-25 DIAGNOSIS — L84 Corns and callosities: Secondary | ICD-10-CM | POA: Diagnosis not present

## 2016-10-25 NOTE — Progress Notes (Signed)
Subjective:     Patient ID: Caleb Joseph, male   DOB: 21-May-1956, 60 y.o.   MRN: [id number]  HPIThis patient returns to the office for evaluation of his diabetic feet.Marland Kitchen  He says this burning  Indicates the callus at the ulcer site needs to be debrided at the base fifth metatarsal right foot.  Patient has callus formation of both forefeet.  He has long painful nails on his remaining toes.  He also has callus on back of his right heel.Marland Kitchen He presents for evaluation and treatment.   Review of Systems     Objective:   Physical Exam GENERAL APPEARANCE: Alert, conversant. Appropriately groomed. No acute distress.  VASCULAR: Pedal pulses palpable at  [facility] and PT bilateral.  Capillary refill time is immediate to all digits,  Normal temperature gradient.   NEUROLOGIC: sensation is absent  to 5.07 monofilament at 5/5 sites bilateral.  Light touch is intact bilateral, Muscle strength normal.  MUSCULOSKELETAL: acceptable muscle strength, tone and stability bilateral.  Intrinsic muscluature intact bilateral.  Rectus appearance of foot and digits noted bilateral. Amputation 1,2,3 left digits.  Excision 3rd metatarsal head left foot.  Amputation 5th ray right foot and distal phalanx IPJ right foot.  DERMATOLOGIC: skin color, texture, and turgor are within normal limits.  No preulcerative lesions or ulcers  are seen, no interdigital maceration noted.  No open lesions present.  . No drainage noted. Callus sub 5th metabase right foot .  No evidence of ulcerations or infections both feet.  Callus noted retrocalcaneally right foot.  Callus sub 1 B/L. NAILS  Thick disfigured discolored nails on five toes both feet.      Assessment:     Onychomycosis  Callus sub 5th metabase right foot    Plan:     Debride Nails.  Debride callus right foot.  RTC 10 weeks.   Gardiner Barefoot DPM

## 2016-12-24 ENCOUNTER — Ambulatory Visit (INDEPENDENT_AMBULATORY_CARE_PROVIDER_SITE_OTHER): Payer: Medicare HMO | Admitting: Endocrinology

## 2016-12-24 ENCOUNTER — Encounter: Payer: Self-pay | Admitting: Endocrinology

## 2016-12-24 VITALS — Ht 75.0 in | Wt 238.0 lb

## 2016-12-24 DIAGNOSIS — E118 Type 2 diabetes mellitus with unspecified complications: Secondary | ICD-10-CM

## 2016-12-24 LAB — POCT GLYCOSYLATED HEMOGLOBIN (HGB A1C): Hemoglobin A1C: 13.1

## 2016-12-24 MED ORDER — INSULIN GLARGINE 300 UNIT/ML ~~LOC~~ SOPN: 50.0000 [IU] | PEN_INJECTOR | SUBCUTANEOUS | 11 refills | Status: DC

## 2016-12-24 NOTE — Progress Notes (Signed)
Subjective:    Patient ID: Caleb Joseph, male    DOB: [birth year], 61 y.o.   MRN: [id number]  HPI pt is referred by Dr Melford Aase, for diabetes.  Pt states DM was dx'ed in 2003; he has severe neuropathy of the lower extremities; he has associated nephropathy, toe amputations, and PAD; he has been on insulin since 2004; pt says his diet is good, but exercise is limited by health problems; he has never had pancreatitis, severe hypoglycemia or DKA.  He takes metformin, toujeo, and prn novolog (averages approx 20 total units per day).  He sometimes forgets to take the insulin. He seldom has hypoglycemia, and these episodes are mild.   Past Medical History:  Diagnosis Date  . Abscess of prostate 02/17/2009  . Diabetes mellitus   . Diabetic foot infection ([place]) 12/26/2011  . 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 HYPERLIPIDEMIA 08/26/2007  . Hypertension   . 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 AMPUTATION  12/30/2011   Procedure: AMPUTATION DIGIT;  Surgeon: Marybelle Killings, MD;  Location: [place];  Service: Orthopedics;  Laterality: Left;  Left [place]  . AMPUTATION  03/20/2012   Procedure: AMPUTATION DIGIT;  Surgeon: Mcarthur Rossetti, MD;  Location: [place];  Service: Orthopedics;  Laterality: Left;  Left foot 3rd toe amputation  . CARDIAC CATHETERIZATION    . I&D EXTREMITY Left 09/28/2015   Procedure: IRRIGATION AND DRAINAGE OF LEFT FOOT with second and third metatarsal amputation;  Surgeon: Mcarthur Rossetti, MD;  Location: [place];  Service: Orthopedics;  Laterality: Left;  . TOE AMPUTATION  08/2009   R little toe    Social History   Social History  . Marital status: Divorced    Spouse name: N/A  . Number of children: N/A  . Years of education: N/A   Occupational History  . Not on file.   Social History Main Topics  . Smoking status: Current Some Day Smoker    Types: Cigars, Pipe  . Smokeless tobacco:  Current User    Types: Chew, Snuff     Comment: occasional chew and cigarette, pipe, cigar user  . Alcohol use Yes     Comment: occasional  . Drug use: No  . Sexual activity: Not on file   Other Topics Concern  . Not on file   Social History Narrative   Disability.          Current Outpatient Prescriptions on File Prior to Visit  Medication Sig Dispense Refill  . albuterol (PROVENTIL HFA;VENTOLIN HFA) 108 (90 BASE) MCG/ACT inhaler Inhale 2 puffs into the lungs every 6 (six) hours as needed. For cold symptoms (Patient taking differently: Inhale 2 puffs into the lungs every 6 (six) hours as needed (cold symptoms). For cold symptoms) 1 Inhaler 1  . aspirin 81 MG tablet Take 1 tablet (81 mg total) by mouth daily. 30 tablet   . cholecalciferol (VITAMIN D) 1000 UNITS tablet Take 1,000 Units by mouth daily.    . divalproex (DEPAKOTE ER) 250 MG 24 hr tablet Take 2 tablet of 500 mg and 1 tablet of 250 mg, total of 1250 mg every night. 30 tablet 0  . divalproex (DEPAKOTE) 500 MG DR tablet Take 2 tablet of 500 mg and 1 tablet of 250 mg, total of 1250 mg every night. 60 tablet 0  . DULoxetine (CYMBALTA) 30 MG capsule Take 1 capsule (30 mg total) by mouth at bedtime. [place]  capsule 11  . ferrous sulfate 325 (65 FE) MG tablet Take 1 tablet (325 mg total) by mouth daily with breakfast. 30 tablet 0  . folic acid (FOLVITE) Q000111Q MCG tablet Take 800 mcg by mouth daily.     Marland Kitchen gabapentin (NEURONTIN) 400 MG capsule Take 1 capsule (400 mg total) by mouth 2 (two) times daily. 60 capsule 0  . ibuprofen (ADVIL,MOTRIN) 800 MG tablet Take 800 mg by mouth 2 (two) times daily as needed (pain).     . insulin aspart (NOVOLOG FLEXPEN) 100 UNIT/ML FlexPen Inject 5-15 Units into the skin daily before lunch. Inject 5 units plus 0-10 units based on CBG - up to 15 units daily    . Lutein 10 MG TABS Take 10 mg by mouth daily.    . metFORMIN (GLUCOPHAGE) 1000 MG tablet Take 1 tablet (1,000 mg total) by mouth 2 (two) times daily  with a meal. 180 tablet 3  . omega-3 acid ethyl esters (LOVAZA) 1 G capsule Take 2 capsules (2 g total) by mouth 2 (two) times daily. 180 capsule 3  . rosuvastatin (CRESTOR) 5 MG tablet Take 1 tablet (5 mg total) by mouth daily. 30 tablet 11  . senna-docusate (SENOKOT-S) 8.6-50 MG tablet Take 1 tablet by mouth at bedtime as needed for mild constipation. 30 tablet 0   No current facility-administered medications on file prior to visit.     Allergies  Allergen Reactions  . Lisinopril Other (See Comments)    Caused upper arm and shoulder weakness  . Losartan Other (See Comments)    Caused upper arm and shoulder weakness    Family History  Problem Relation Age of Onset  . Diabetes Mother    Ht 6\' 3"  (1.905 m)   Wt 238 lb (108 kg)   BMI 29.75 kg/m   Review of Systems denies weight loss, blurry vision, headache, chest pain, sob, n/v, urinary frequency, muscle cramps, excessive diaphoresis, depression, cold intolerance, rhinorrhea, and easy bruising.  Depression is well-controlled.  He has moderate memory loss.      Objective:   Physical Exam VS: see vs page GEN: no distress HEAD: head: no deformity eyes: no periorbital swelling, no proptosis external nose and ears are normal mouth: no lesion seen NECK: supple, thyroid is not enlarged CHEST WALL: no deformity.  LUNGS: clear to auscultation. CV: reg rate and rhythm, no murmur.  ABD: abdomen is soft, nontender.  no hepatosplenomegaly.  not distended.  no hernia.  MUSCULOSKELETAL: muscle bulk and strength are grossly normal.  no obvious joint swelling.  gait is steady with a cane.   EXTEMITIES: no ulcer on the feet, but the skin is dry.  feet are of normal color and temp.  no edema. These toes have been amputated: left 1, 2, and 5; right 1 and 5.   PULSES: dorsalis pedis absent bilat.  no carotid bruit NEURO:  cn 2-12 grossly intact.   readily moves all 4's.  sensation is absent to touch on the feet.  Coarse tremor of the left  hand SKIN:  Normal texture and temperature.  No rash or suspicious lesion is visible.   NODES:  None palpable at the neck PSYCH: alert, well-oriented.  Does not appear anxious nor depressed.  I personally reviewed electrocardiogram tracing (10/12/16): Indication: AMS Impression: ST and BFB. No MI.  No hypertrophy. Compared to 09/25/15, ST is new  Lab Results  Component Value Date   MICROALBUR 17.00 (H) 04/22/2013   A1c=13.1%  I have reviewed outside  records, and summarized: Pt was noted to have severely elevated a1c, and referred here.  He was noted to be feeling well, but diet and exercise were noted to be poor.     Assessment & Plan:  Insulin-requiring type 2 DM: severe exacerbation. Memory loss: this is compromising his DM self-care. We'll address this by simplifying his insulin regimen (emphasizing basal insulin).    Patient is advised the following: Patient Instructions  good diet and exercise significantly improve the control of your diabetes.  please let me know if you wish to be referred to a dietician.  high blood sugar is very risky to your health.  you should see an eye doctor and dentist every year.  It is very important to get all recommended vaccinations.  Controlling your blood pressure and cholesterol drastically reduces the damage diabetes does to your body.  Those who smoke should quit.  Please discuss these with your doctor.  check your blood sugar twice a day.  vary the time of day when you check, between before the 3 meals, and at bedtime.  also check if you have symptoms of your blood sugar being too high or too low.  please keep a record of the readings and bring it to your next appointment here (or you can bring the meter itself).  You can write it on any piece of paper.  please call us sooner if your blood sugar goes below 70, or if you have a lot of readings over 200. For now, please: Please continue the same metformin, and: Increase the toujeo to 50 units  each morning, and: Take novolog, just 2 units for any blood sugar over 300.  On this type of insulin schedule, you should eat meals on a regular schedule.  If a meal is missed or significantly delayed, your blood sugar could go low. Please come back for a follow-up appointment in 1 week.

## 2016-12-24 NOTE — Patient Instructions (Addendum)
good diet and exercise significantly improve the control of your diabetes.  please let me know if you wish to be referred to a dietician.  high blood sugar is very risky to your health.  you should see an eye doctor and dentist every year.  It is very important to get all recommended vaccinations.  Controlling your blood pressure and cholesterol drastically reduces the damage diabetes does to your body.  Those who smoke should quit.  Please discuss these with your doctor.  check your blood sugar twice a day.  vary the time of day when you check, between before the 3 meals, and at bedtime.  also check if you have symptoms of your blood sugar being too high or too low.  please keep a record of the readings and bring it to your next appointment here (or you can bring the meter itself).  You can write it on any piece of paper.  please call us sooner if your blood sugar goes below 70, or if you have a lot of readings over 200. For now, please: Please continue the same metformin, and: Increase the toujeo to 50 units each morning, and: Take novolog, just 2 units for any blood sugar over 300.  On this type of insulin schedule, you should eat meals on a regular schedule.  If a meal is missed or significantly delayed, your blood sugar could go low. Please come back for a follow-up appointment in 1 week.

## 2016-12-26 ENCOUNTER — Other Ambulatory Visit: Payer: Self-pay

## 2016-12-26 MED ORDER — INSULIN PEN NEEDLE 31G X 8 MM MISC: 2 refills | Status: DC

## 2017-01-08 ENCOUNTER — Ambulatory Visit: Payer: Self-pay | Admitting: Endocrinology

## 2017-01-17 ENCOUNTER — Ambulatory Visit (INDEPENDENT_AMBULATORY_CARE_PROVIDER_SITE_OTHER): Payer: Medicare HMO | Admitting: Podiatry

## 2017-01-17 DIAGNOSIS — E1151 Type 2 diabetes mellitus with diabetic peripheral angiopathy without gangrene: Secondary | ICD-10-CM

## 2017-01-17 DIAGNOSIS — E1149 Type 2 diabetes mellitus with other diabetic neurological complication: Secondary | ICD-10-CM

## 2017-01-17 DIAGNOSIS — I739 Peripheral vascular disease, unspecified: Secondary | ICD-10-CM

## 2017-01-17 DIAGNOSIS — B351 Tinea unguium: Secondary | ICD-10-CM

## 2017-01-17 DIAGNOSIS — M79676 Pain in unspecified toe(s): Secondary | ICD-10-CM | POA: Diagnosis not present

## 2017-01-17 NOTE — Progress Notes (Signed)
Subjective:     Patient ID: Caleb Joseph, male   DOB: 05-31-1956, 61 y.o.   MRN: [id number]  HPIThis patient returns to the office for evaluation of his diabetic feet.Marland Kitchen  He says this burning  Indicates the callus at the ulcer site needs to be debrided at the base fifth metatarsal right foot.  Patient has callus formation of both forefeet.  He has long painful nails on his remaining toes.   He presents for evaluation and treatment.   Review of Systems     Objective:   Physical Exam GENERAL APPEARANCE: Alert, conversant. Appropriately groomed. No acute distress.  VASCULAR: Pedal pulses palpable at  [facility] and PT bilateral.  Capillary refill time is immediate to all digits,  Normal temperature gradient.   NEUROLOGIC: sensation is absent  to 5.07 monofilament at 5/5 sites bilateral.  Light touch is intact bilateral, Muscle strength normal.  MUSCULOSKELETAL: acceptable muscle strength, tone and stability bilateral.  Intrinsic muscluature intact bilateral.  Rectus appearance of foot and digits noted bilateral. Amputation 1,2,3 left digits.  Excision 3rd metatarsal head left foot.  Amputation 5th ray right foot and distal phalanx IPJ right foot.  DERMATOLOGIC: skin color, texture, and turgor are within normal limits.  No preulcerative lesions or ulcers  are seen, no interdigital maceration noted.  No open lesions present.  . No drainage noted. Callus sub 5th metabase right foot .  No evidence of ulcerations or infections both feet.  Callus noted retrocalcaneally right foot.  Callus sub 1 B/L. NAILS  Thick disfigured discolored nails on five toes both feet.      Assessment:     Onychomycosis  Callus sub 5th metabase right foot    Plan:     Debride Nails.  Debride callus right foot.  RTC 10 weeks.   Gardiner Barefoot DPM

## 2017-01-18 ENCOUNTER — Encounter: Payer: Self-pay | Admitting: Endocrinology

## 2017-01-18 ENCOUNTER — Ambulatory Visit (INDEPENDENT_AMBULATORY_CARE_PROVIDER_SITE_OTHER): Payer: Medicare HMO | Admitting: Endocrinology

## 2017-01-18 VITALS — BP 132/84 | HR 92 | Ht 75.0 in | Wt 242.0 lb

## 2017-01-18 DIAGNOSIS — E118 Type 2 diabetes mellitus with unspecified complications: Secondary | ICD-10-CM

## 2017-01-18 MED ORDER — INSULIN GLARGINE 300 UNIT/ML ~~LOC~~ SOPN: 60.0000 [IU] | PEN_INJECTOR | SUBCUTANEOUS | 11 refills | Status: DC

## 2017-01-18 NOTE — Progress Notes (Signed)
Subjective:    Patient ID: Caleb Joseph, male    DOB: May 27, 1956, 61 y.o.   MRN: [id number]  HPI Pt returns for f/u of diabetes mellitus: DM type: Insulin-requiring type 2 Dx'ed: 123456 Complications: polyneuropathy, nephropathy, toe amputations, and PAD Therapy: insulin since 2004 DKA: never Severe hypoglycemia: never Pancreatitis: never Other: he takes qd insulin, due to h/o noncompliance Interval history: He brings a record of his cbg's which I have reviewed today.  It varies from 178-500.  pt states he feels well in general.   Past Medical History:  Diagnosis Date  . Abscess of prostate 02/17/2009  . Diabetes mellitus   . Diabetic foot infection ([place]) 12/26/2011  . 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 HYPERLIPIDEMIA 08/26/2007  . Hypertension   . 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 AMPUTATION  12/30/2011   Procedure: AMPUTATION DIGIT;  Surgeon: Marybelle Killings, MD;  Location: [place];  Service: Orthopedics;  Laterality: Left;  Left Great  . AMPUTATION  03/20/2012   Procedure: AMPUTATION DIGIT;  Surgeon: Mcarthur Rossetti, MD;  Location: [place];  Service: Orthopedics;  Laterality: Left;  Left foot 3rd toe amputation  . CARDIAC CATHETERIZATION    . I&D EXTREMITY Left 09/28/2015   Procedure: IRRIGATION AND DRAINAGE OF LEFT FOOT with second and third metatarsal amputation;  Surgeon: Mcarthur Rossetti, MD;  Location: [place];  Service: Orthopedics;  Laterality: Left;  . TOE AMPUTATION  08/2009   R little toe    Social History   Social History  . Marital status: Divorced    Spouse name: N/A  . Number of children: N/A  . Years of education: N/A   Occupational History  . Not on file.   Social History Main Topics  . Smoking status: Current Some Day Smoker    Types: Cigars, Pipe  . Smokeless tobacco: Current User    Types: Chew, Snuff     Comment: occasional chew and cigarette, pipe, cigar  user  . Alcohol use Yes     Comment: occasional  . Drug use: No  . Sexual activity: Not on file   Other Topics Concern  . Not on file   Social History Narrative   Disability.          Current Outpatient Prescriptions on File Prior to Visit  Medication Sig Dispense Refill  . albuterol (PROVENTIL HFA;VENTOLIN HFA) 108 (90 BASE) MCG/ACT inhaler Inhale 2 puffs into the lungs every 6 (six) hours as needed. For cold symptoms (Patient taking differently: Inhale 2 puffs into the lungs every 6 (six) hours as needed (cold symptoms). For cold symptoms) 1 Inhaler 1  . aspirin 81 MG tablet Take 1 tablet (81 mg total) by mouth daily. 30 tablet   . cholecalciferol (VITAMIN D) 1000 UNITS tablet Take 1,000 Units by mouth daily.    . divalproex (DEPAKOTE ER) 250 MG 24 hr tablet Take 2 tablet of 500 mg and 1 tablet of 250 mg, total of 1250 mg every night. 30 tablet 0  . divalproex (DEPAKOTE) 500 MG DR tablet Take 2 tablet of 500 mg and 1 tablet of 250 mg, total of 1250 mg every night. 60 tablet 0  . DULoxetine (CYMBALTA) 30 MG capsule Take 1 capsule (30 mg total) by mouth at bedtime. 30 capsule 11  . ferrous sulfate 325 (65 FE) MG tablet Take 1 tablet (325 mg total) by mouth daily with breakfast. 30 tablet 0  .  folic acid (FOLVITE) Q000111Q MCG tablet Take 800 mcg by mouth daily.     Marland Kitchen gabapentin (NEURONTIN) 400 MG capsule Take 1 capsule (400 mg total) by mouth 2 (two) times daily. 60 capsule 0  . ibuprofen (ADVIL,MOTRIN) 800 MG tablet Take 800 mg by mouth 2 (two) times daily as needed (pain).     . Insulin Pen Needle 31G X 8 MM MISC Use to inject insulin 3 times per day. 300 each 2  . Lutein 10 MG TABS Take 10 mg by mouth daily.    . metFORMIN (GLUCOPHAGE) 1000 MG tablet Take 1 tablet (1,000 mg total) by mouth 2 (two) times daily with a meal. 180 tablet 3  . omega-3 acid ethyl esters (LOVAZA) 1 G capsule Take 2 capsules (2 g total) by mouth 2 (two) times daily. 180 capsule 3  . rosuvastatin (CRESTOR) 5 MG  tablet Take 1 tablet (5 mg total) by mouth daily. 30 tablet 11  . senna-docusate (SENOKOT-S) 8.6-50 MG tablet Take 1 tablet by mouth at bedtime as needed for mild constipation. 30 tablet 0   No current facility-administered medications on file prior to visit.     Allergies  Allergen Reactions  . Lisinopril Other (See Comments)    Caused upper arm and shoulder weakness  . Losartan Other (See Comments)    Caused upper arm and shoulder weakness    Family History  Problem Relation Age of Onset  . Diabetes Mother     BP 132/84   Pulse 92   Ht 6\' 3"  (1.905 m)   Wt 242 lb (109.8 kg)   SpO2 97%   BMI 30.25 kg/m    Review of Systems He denies hypoglycemia.      Objective:   Physical Exam VITAL SIGNS:  See vs page GENERAL: no distress Pulses: dorsalis pedis intact bilat.   MSK: no deformity of the feet, but these toes have been amputated: left 1, 2, and 5; right 1 and 5.  CV: no leg edema Skin:  no ulcer on the feet.  normal color and temp on the feet. Neuro: sensation is intact to touch on the feet      Assessment & Plan:  Insulin-requiring type 2 DM, with PAD: he needs increased rx.  Patient is advised the following: Patient Instructions  check your blood sugar twice a day.  vary the time of day when you check, between before the 3 meals, and at bedtime.  also check if you have symptoms of your blood sugar being too high or too low.  please keep a record of the readings and bring it to your next appointment here (or you can bring the meter itself).  You can write it on any piece of paper.  please call us sooner if your blood sugar goes below 70, or if you have a lot of readings over 200. For now, please: Please continue the same metformin, and: Increase the toujeo to 60 units each morning, and: Take novolog, just 2 units for any blood sugar over 300.  On this type of insulin schedule, you should eat meals on a regular schedule.  If a meal is missed or significantly  delayed, your blood sugar could go low.  Please come back for a follow-up appointment in 1 month.

## 2017-01-18 NOTE — Patient Instructions (Addendum)
check your blood sugar twice a day.  vary the time of day when you check, between before the 3 meals, and at bedtime.  also check if you have symptoms of your blood sugar being too high or too low.  please keep a record of the readings and bring it to your next appointment here (or you can bring the meter itself).  You can write it on any piece of paper.  please call us sooner if your blood sugar goes below 70, or if you have a lot of readings over 200. For now, please: Please continue the same metformin, and: Increase the toujeo to 60 units each morning, and: Take novolog, just 2 units for any blood sugar over 300.  On this type of insulin schedule, you should eat meals on a regular schedule.  If a meal is missed or significantly delayed, your blood sugar could go low.  Please come back for a follow-up appointment in 1 month.

## 2017-01-22 ENCOUNTER — Telehealth: Payer: Self-pay | Admitting: *Deleted

## 2017-01-22 NOTE — Telephone Encounter (Signed)
Pt states Dr. Prudence Davidson talked about a request for diabetic shoes being sent to Dr. Renato Shin at [facility]. Pt states he talked to Dr. Loanne Drilling and he will sign the papers.

## 2017-01-23 ENCOUNTER — Telehealth: Payer: Self-pay | Admitting: Endocrinology

## 2017-01-23 NOTE — Telephone Encounter (Signed)
I contacted the patient voiced understanding and had no further questions at this time.

## 2017-01-23 NOTE — Telephone Encounter (Signed)
Increase the toujeo to 70 units each morning, and: Take novolog, just 2 units for any blood sugar over 300.  I'll see you next time.

## 2017-01-23 NOTE — Telephone Encounter (Signed)
Pt is calling to give the BS update: 01/19/17 at 12:50 223 01/20/17 at 0500 took 15 u of novolog-foot was stinging and then missed breakfast; lunch sugars it was 136;  01/21/17 at 1200 339; supper 290 with 5 u of novolog 2/27 6:40 am 334; then lunch was 319 after his 60 u; bedtime 371 11:10 pm 2/28 AM 295 1140 today no shot yet

## 2017-02-15 ENCOUNTER — Ambulatory Visit: Payer: Self-pay | Admitting: Endocrinology

## 2017-03-06 ENCOUNTER — Ambulatory Visit (INDEPENDENT_AMBULATORY_CARE_PROVIDER_SITE_OTHER): Payer: Medicare HMO | Admitting: Endocrinology

## 2017-03-06 ENCOUNTER — Encounter: Payer: Self-pay | Admitting: Endocrinology

## 2017-03-06 VITALS — BP 132/88 | HR 92 | Wt 244.0 lb

## 2017-03-06 DIAGNOSIS — E118 Type 2 diabetes mellitus with unspecified complications: Secondary | ICD-10-CM | POA: Diagnosis not present

## 2017-03-06 LAB — POCT GLYCOSYLATED HEMOGLOBIN (HGB A1C): Hemoglobin A1C: 11.2

## 2017-03-06 MED ORDER — INSULIN GLARGINE 300 UNIT/ML ~~LOC~~ SOPN: 90.0000 [IU] | PEN_INJECTOR | SUBCUTANEOUS | 11 refills | Status: DC

## 2017-03-06 NOTE — Patient Instructions (Addendum)
check your blood sugar twice a day.  vary the time of day when you check, between before the 3 meals, and at bedtime.  also check if you have symptoms of your blood sugar being too high or too low.  please keep a record of the readings and bring it to your next appointment here (or you can bring the meter itself).  You can write it on any piece of paper.  please call us sooner if your blood sugar goes below 70, or if you have a lot of readings over 200. For now, please: Please continue the same metformin, and: Increase the toujeo to 90 units each morning, and: Take novolog, just 2 units for any blood sugar over 300.  On this type of insulin schedule, you should eat meals on a regular schedule.  If a meal is missed or significantly delayed, your blood sugar could go low.  Please call us next week, to tell us how the blood sugar is doing.   Please come back for a follow-up appointment in 1 month.

## 2017-03-06 NOTE — Progress Notes (Signed)
Subjective:    Patient ID: Caleb Joseph, male    DOB: 1956/04/30, 61 y.o.   MRN: [id number]  HPI Pt returns for f/u of diabetes mellitus: DM type: Insulin-requiring type 2 Dx'ed: 0350 Complications: polyneuropathy, nephropathy, toe amputations, and PAD Therapy: insulin since 2004 DKA: never Severe hypoglycemia: never Pancreatitis: never Other: he takes qd insulin, due to h/o med noncompliance.  Interval history: He brings a record of his cbg's which I have reviewed today.  It varies from 200-400's.  He has missed the insulin approx twice ij the past month.  Past Medical History:  Diagnosis Date  . Abscess of prostate 02/17/2009  . Diabetes mellitus   . Diabetic foot infection ([place]) 12/26/2011  . 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 HYPERLIPIDEMIA 08/26/2007  . Hypertension   . 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 AMPUTATION  12/30/2011   Procedure: AMPUTATION DIGIT;  Surgeon: Marybelle Killings, MD;  Location: [place];  Service: Orthopedics;  Laterality: Left;  Left [place]  . AMPUTATION  03/20/2012   Procedure: AMPUTATION DIGIT;  Surgeon: Mcarthur Rossetti, MD;  Location: [place];  Service: Orthopedics;  Laterality: Left;  Left foot 3rd toe amputation  . CARDIAC CATHETERIZATION    . I&D EXTREMITY Left 09/28/2015   Procedure: IRRIGATION AND DRAINAGE OF LEFT FOOT with second and third metatarsal amputation;  Surgeon: Mcarthur Rossetti, MD;  Location: [place];  Service: Orthopedics;  Laterality: Left;  . TOE AMPUTATION  08/2009   R little toe    Social History   Social History  . Marital status: Divorced    Spouse name: N/A  . Number of children: N/A  . Years of education: N/A   Occupational History  . Not on file.   Social History Main Topics  . Smoking status: Current Some Day Smoker    Types: Cigars, Pipe  . Smokeless tobacco: Current User    Types: Chew, Snuff     Comment: occasional  chew and cigarette, pipe, cigar user  . Alcohol use Yes     Comment: occasional  . Drug use: No  . Sexual activity: Not on file   Other Topics Concern  . Not on file   Social History Narrative   Disability.          Current Outpatient Prescriptions on File Prior to Visit  Medication Sig Dispense Refill  . albuterol (PROVENTIL HFA;VENTOLIN HFA) 108 (90 BASE) MCG/ACT inhaler Inhale 2 puffs into the lungs every 6 (six) hours as needed. For cold symptoms (Patient taking differently: Inhale 2 puffs into the lungs every 6 (six) hours as needed (cold symptoms). For cold symptoms) 1 Inhaler 1  . aspirin 81 MG tablet Take 1 tablet (81 mg total) by mouth daily. 30 tablet   . cholecalciferol (VITAMIN D) 1000 UNITS tablet Take 1,000 Units by mouth daily.    . divalproex (DEPAKOTE ER) 250 MG 24 hr tablet Take 2 tablet of 500 mg and 1 tablet of 250 mg, total of 1250 mg every night. 30 tablet 0  . divalproex (DEPAKOTE) 500 MG DR tablet Take 2 tablet of 500 mg and 1 tablet of 250 mg, total of 1250 mg every night. 60 tablet 0  . DULoxetine (CYMBALTA) 30 MG capsule Take 1 capsule (30 mg total) by mouth at bedtime. 30 capsule 11  . ferrous sulfate 325 (65 FE) MG tablet Take 1 tablet (325 mg total) by mouth daily  with breakfast. 30 tablet 0  . folic acid (FOLVITE) 191 MCG tablet Take 800 mcg by mouth daily.     Marland Kitchen gabapentin (NEURONTIN) 400 MG capsule Take 1 capsule (400 mg total) by mouth 2 (two) times daily. 60 capsule 0  . ibuprofen (ADVIL,MOTRIN) 800 MG tablet Take 800 mg by mouth 2 (two) times daily as needed (pain).     . Insulin Pen Needle 31G X 8 MM MISC Use to inject insulin 3 times per day. 300 each 2  . Lutein 10 MG TABS Take 10 mg by mouth daily.    . metFORMIN (GLUCOPHAGE) 1000 MG tablet Take 1 tablet (1,000 mg total) by mouth 2 (two) times daily with a meal. 180 tablet 3  . omega-3 acid ethyl esters (LOVAZA) 1 G capsule Take 2 capsules (2 g total) by mouth 2 (two) times daily. 180 capsule 3    . rosuvastatin (CRESTOR) 5 MG tablet Take 1 tablet (5 mg total) by mouth daily. 30 tablet 11  . senna-docusate (SENOKOT-S) 8.6-50 MG tablet Take 1 tablet by mouth at bedtime as needed for mild constipation. 30 tablet 0   No current facility-administered medications on file prior to visit.     Allergies  Allergen Reactions  . Lisinopril Other (See Comments)    Caused upper arm and shoulder weakness  . Losartan Other (See Comments)    Caused upper arm and shoulder weakness    Family History  Problem Relation Age of Onset  . Diabetes Mother     BP 132/88 (BP Location: Left Arm, Patient Position: Sitting)   Pulse 92   Wt 244 lb (110.7 kg)   SpO2 99%   BMI 30.50 kg/m    Review of Systems He denies hypoglycemia.      Objective:   Physical Exam VITAL SIGNS:  See vs page GENERAL: no distress Pulses: dorsalis pedis intact bilat.   MSK: no deformity of the feet, but these toes have been amputated: left 1, 2, and 5; right 1 and 5.  CV: trace bilat leg edema.  Skin:  no ulcer on the feet.  normal color and temp on the feet. Neuro: sensation is absent to touch on the feet and legs.    Lab Results  Component Value Date   HGBA1C 11.2 03/06/2017      Assessment & Plan:  Insulin-requiring type 2 DM, with PAD: he needs increased rx.   Patient Instructions  check your blood sugar twice a day.  vary the time of day when you check, between before the 3 meals, and at bedtime.  also check if you have symptoms of your blood sugar being too high or too low.  please keep a record of the readings and bring it to your next appointment here (or you can bring the meter itself).  You can write it on any piece of paper.  please call us sooner if your blood sugar goes below 70, or if you have a lot of readings over 200. For now, please: Please continue the same metformin, and: Increase the toujeo to 90 units each morning, and: Take novolog, just 2 units for any blood sugar over 300.  On this  type of insulin schedule, you should eat meals on a regular schedule.  If a meal is missed or significantly delayed, your blood sugar could go low.  Please call us next week, to tell us how the blood sugar is doing.   Please come back for a follow-up appointment in 1  month.

## 2017-03-14 ENCOUNTER — Encounter (HOSPITAL_COMMUNITY): Payer: Self-pay | Admitting: Emergency Medicine

## 2017-03-14 ENCOUNTER — Emergency Department (HOSPITAL_COMMUNITY): Payer: Medicare HMO

## 2017-03-14 ENCOUNTER — Inpatient Hospital Stay (HOSPITAL_COMMUNITY): Payer: Medicare HMO

## 2017-03-14 ENCOUNTER — Observation Stay (HOSPITAL_COMMUNITY)
Admission: EM | Admit: 2017-03-14 | Discharge: 2017-03-18 | Disposition: A | Discharge status: Skilled Nursing Facility | Payer: Medicare HMO | Attending: Internal Medicine | Admitting: Internal Medicine

## 2017-03-14 DIAGNOSIS — Z720 Tobacco use: Secondary | ICD-10-CM | POA: Diagnosis not present

## 2017-03-14 DIAGNOSIS — Z794 Long term (current) use of insulin: Secondary | ICD-10-CM | POA: Diagnosis not present

## 2017-03-14 DIAGNOSIS — Z89421 Acquired absence of other right toe(s): Secondary | ICD-10-CM | POA: Diagnosis not present

## 2017-03-14 DIAGNOSIS — K219 Gastro-esophageal reflux disease without esophagitis: Secondary | ICD-10-CM | POA: Diagnosis not present

## 2017-03-14 DIAGNOSIS — E119 Type 2 diabetes mellitus without complications: Secondary | ICD-10-CM | POA: Diagnosis not present

## 2017-03-14 DIAGNOSIS — Z79899 Other long term (current) drug therapy: Secondary | ICD-10-CM | POA: Diagnosis not present

## 2017-03-14 DIAGNOSIS — E785 Hyperlipidemia, unspecified: Secondary | ICD-10-CM | POA: Diagnosis not present

## 2017-03-14 DIAGNOSIS — I10 Essential (primary) hypertension: Secondary | ICD-10-CM | POA: Diagnosis present

## 2017-03-14 DIAGNOSIS — S065X0A Traumatic subdural hemorrhage without loss of consciousness, initial encounter: Secondary | ICD-10-CM | POA: Diagnosis not present

## 2017-03-14 DIAGNOSIS — S065X9A Traumatic subdural hemorrhage with loss of consciousness of unspecified duration, initial encounter: Secondary | ICD-10-CM | POA: Diagnosis present

## 2017-03-14 DIAGNOSIS — G9340 Encephalopathy, unspecified: Secondary | ICD-10-CM | POA: Diagnosis not present

## 2017-03-14 DIAGNOSIS — Z89422 Acquired absence of other left toe(s): Secondary | ICD-10-CM | POA: Diagnosis not present

## 2017-03-14 DIAGNOSIS — I6200 Nontraumatic subdural hemorrhage, unspecified: Secondary | ICD-10-CM | POA: Diagnosis not present

## 2017-03-14 DIAGNOSIS — Z7982 Long term (current) use of aspirin: Secondary | ICD-10-CM | POA: Insufficient documentation

## 2017-03-14 DIAGNOSIS — Z89412 Acquired absence of left great toe: Secondary | ICD-10-CM | POA: Insufficient documentation

## 2017-03-14 DIAGNOSIS — E1165 Type 2 diabetes mellitus with hyperglycemia: Secondary | ICD-10-CM | POA: Insufficient documentation

## 2017-03-14 DIAGNOSIS — W19XXXA Unspecified fall, initial encounter: Secondary | ICD-10-CM | POA: Diagnosis not present

## 2017-03-14 DIAGNOSIS — F17290 Nicotine dependence, other tobacco product, uncomplicated: Secondary | ICD-10-CM | POA: Diagnosis not present

## 2017-03-14 DIAGNOSIS — F329 Major depressive disorder, single episode, unspecified: Secondary | ICD-10-CM | POA: Insufficient documentation

## 2017-03-14 DIAGNOSIS — W01198A Fall on same level from slipping, tripping and stumbling with subsequent striking against other object, initial encounter: Secondary | ICD-10-CM | POA: Diagnosis not present

## 2017-03-14 DIAGNOSIS — E1142 Type 2 diabetes mellitus with diabetic polyneuropathy: Secondary | ICD-10-CM | POA: Insufficient documentation

## 2017-03-14 DIAGNOSIS — F32A Depression, unspecified: Secondary | ICD-10-CM | POA: Diagnosis present

## 2017-03-14 DIAGNOSIS — S065XAA Traumatic subdural hemorrhage with loss of consciousness status unknown, initial encounter: Secondary | ICD-10-CM

## 2017-03-14 LAB — DIFFERENTIAL
BASOS ABS: 0 10*3/uL (ref 0.0–0.1)
Basophils Relative: 0 %
Eosinophils Absolute: 0.1 10*3/uL (ref 0.0–0.7)
Eosinophils Relative: 1 %
LYMPHS ABS: 1.3 10*3/uL (ref 0.7–4.0)
Lymphocytes Relative: 13 %
MONOS PCT: 5 %
Monocytes Absolute: 0.5 10*3/uL (ref 0.1–1.0)
NEUTROS ABS: 8.2 10*3/uL — AB (ref 1.7–7.7)
Neutrophils Relative %: 81 %

## 2017-03-14 LAB — I-STAT CHEM 8, ED
BUN: 11 mg/dL (ref 6–20)
CHLORIDE: 98 mmol/L — AB (ref 101–111)
Calcium, Ion: 1.13 mmol/L — ABNORMAL LOW (ref 1.15–1.40)
Creatinine, Ser: 0.8 mg/dL (ref 0.61–1.24)
GLUCOSE: 196 mg/dL — AB (ref 65–99)
HCT: 45 % (ref 39.0–52.0)
HEMOGLOBIN: 15.3 g/dL (ref 13.0–17.0)
POTASSIUM: 4.3 mmol/L (ref 3.5–5.1)
SODIUM: 131 mmol/L — AB (ref 135–145)
TCO2: 24 mmol/L (ref 0–100)

## 2017-03-14 LAB — COMPREHENSIVE METABOLIC PANEL
ALT: 12 U/L — AB (ref 17–63)
AST: 20 U/L (ref 15–41)
Albumin: 4.4 g/dL (ref 3.5–5.0)
Alkaline Phosphatase: 55 U/L (ref 38–126)
Anion gap: 10 (ref 5–15)
BILIRUBIN TOTAL: 0.9 mg/dL (ref 0.3–1.2)
BUN: 11 mg/dL (ref 6–20)
CHLORIDE: 99 mmol/L — AB (ref 101–111)
CO2: 23 mmol/L (ref 22–32)
CREATININE: 0.87 mg/dL (ref 0.61–1.24)
Calcium: 9.3 mg/dL (ref 8.9–10.3)
GFR calc Af Amer: >60 mL/min (ref >60)
GLUCOSE: 201 mg/dL — AB (ref 65–99)
Potassium: 4.2 mmol/L (ref 3.5–5.1)
Sodium: 132 mmol/L — ABNORMAL LOW (ref 135–145)
TOTAL PROTEIN: 7.7 g/dL (ref 6.5–8.1)

## 2017-03-14 LAB — URINALYSIS, COMPLETE (UACMP) WITH MICROSCOPIC
Bacteria, UA: NONE SEEN
Bilirubin Urine: NEGATIVE
HGB URINE DIPSTICK: NEGATIVE
Ketones, ur: 20 mg/dL — AB
Leukocytes, UA: NEGATIVE
Nitrite: NEGATIVE
PH: 7 (ref 5.0–8.0)
PROTEIN: NEGATIVE mg/dL
Specific Gravity, Urine: 1.008 (ref 1.005–1.030)

## 2017-03-14 LAB — RAPID URINE DRUG SCREEN, HOSP PERFORMED
Amphetamines: NOT DETECTED
BARBITURATES: NOT DETECTED
BENZODIAZEPINES: NOT DETECTED
Cocaine: NOT DETECTED
Opiates: NOT DETECTED
TETRAHYDROCANNABINOL: NOT DETECTED

## 2017-03-14 LAB — I-STAT TROPONIN, ED: Troponin i, poc: 0 ng/mL (ref 0.00–0.08)

## 2017-03-14 LAB — ETHANOL: Alcohol, Ethyl (B): <5 mg/dL (ref <5)

## 2017-03-14 LAB — LIPASE, BLOOD: Lipase: 14 U/L (ref 11–51)

## 2017-03-14 LAB — CBC
HEMATOCRIT: 42 % (ref 39.0–52.0)
HEMOGLOBIN: 15.2 g/dL (ref 13.0–17.0)
MCH: 30.9 pg (ref 26.0–34.0)
MCHC: 36.2 g/dL — ABNORMAL HIGH (ref 30.0–36.0)
MCV: 85.4 fL (ref 78.0–100.0)
Platelets: 164 10*3/uL (ref 150–400)
RBC: 4.92 MIL/uL (ref 4.22–5.81)
RDW: 12 % (ref 11.5–15.5)
WBC: 10 10*3/uL (ref 4.0–10.5)

## 2017-03-14 LAB — APTT: aPTT: 28 seconds (ref 24–36)

## 2017-03-14 LAB — PROTIME-INR
INR: 0.99
Prothrombin Time: 13.1 seconds (ref 11.4–15.2)

## 2017-03-14 LAB — GLUCOSE, CAPILLARY: GLUCOSE-CAPILLARY: 179 mg/dL — AB (ref 65–99)

## 2017-03-14 LAB — CK: Total CK: 237 U/L (ref 49–397)

## 2017-03-14 LAB — CBG MONITORING, ED: Glucose-Capillary: 169 mg/dL — ABNORMAL HIGH (ref 65–99)

## 2017-03-14 MED ORDER — GABAPENTIN 300 MG PO CAPS
600.0000 mg | ORAL_CAPSULE | Freq: Two times a day (BID) | ORAL | Status: DC
  Administered 2017-03-15 – 2017-03-18 (×8): 600 mg via ORAL
  Filled 2017-03-14 (×8): qty 2

## 2017-03-14 MED ORDER — INSULIN ASPART 100 UNIT/ML ~~LOC~~ SOLN
0.0000 [IU] | Freq: Three times a day (TID) | SUBCUTANEOUS | Status: DC
  Administered 2017-03-15: 1 [IU] via SUBCUTANEOUS
  Administered 2017-03-16: 3 [IU] via SUBCUTANEOUS
  Administered 2017-03-16: 2 [IU] via SUBCUTANEOUS
  Administered 2017-03-17: 3 [IU] via SUBCUTANEOUS
  Administered 2017-03-18: 1 [IU] via SUBCUTANEOUS

## 2017-03-14 MED ORDER — SENNOSIDES-DOCUSATE SODIUM 8.6-50 MG PO TABS
1.0000 | ORAL_TABLET | Freq: Every evening | ORAL | Status: DC | PRN
  Administered 2017-03-18: 1 via ORAL
  Filled 2017-03-14: qty 1

## 2017-03-14 MED ORDER — DIVALPROEX SODIUM ER 500 MG PO TB24
1250.0000 mg | ORAL_TABLET | Freq: Every day | ORAL | Status: DC
  Administered 2017-03-15 – 2017-03-17 (×4): 1250 mg via ORAL
  Filled 2017-03-14 (×4): qty 1

## 2017-03-14 MED ORDER — ROSUVASTATIN CALCIUM 5 MG PO TABS
5.0000 mg | ORAL_TABLET | Freq: Every day | ORAL | Status: DC
  Administered 2017-03-15 – 2017-03-17 (×4): 5 mg via ORAL
  Filled 2017-03-14 (×4): qty 1

## 2017-03-14 MED ORDER — ACETAMINOPHEN 325 MG PO TABS
650.0000 mg | ORAL_TABLET | Freq: Four times a day (QID) | ORAL | Status: DC | PRN
  Administered 2017-03-16 – 2017-03-17 (×3): 650 mg via ORAL
  Filled 2017-03-14 (×3): qty 2

## 2017-03-14 MED ORDER — VITAMIN D 1000 UNITS PO TABS
1000.0000 [IU] | ORAL_TABLET | Freq: Every day | ORAL | Status: DC
Start: 2017-03-14 — End: 2017-03-18
  Administered 2017-03-15 – 2017-03-18 (×5): 1000 [IU] via ORAL
  Filled 2017-03-14 (×6): qty 1

## 2017-03-14 MED ORDER — OMEGA-3-ACID ETHYL ESTERS 1 G PO CAPS
2.0000 g | ORAL_CAPSULE | Freq: Two times a day (BID) | ORAL | Status: DC
  Administered 2017-03-15 – 2017-03-18 (×8): 2 g via ORAL
  Filled 2017-03-14 (×8): qty 2

## 2017-03-14 MED ORDER — PANTOPRAZOLE SODIUM 40 MG PO TBEC
40.0000 mg | DELAYED_RELEASE_TABLET | Freq: Every day | ORAL | Status: DC
  Administered 2017-03-16 – 2017-03-18 (×3): 40 mg via ORAL
  Filled 2017-03-14 (×4): qty 1

## 2017-03-14 MED ORDER — SODIUM CHLORIDE 0.9 % IV SOLN
INTRAVENOUS | Status: DC
  Administered 2017-03-14 – 2017-03-16 (×3): via INTRAVENOUS

## 2017-03-14 MED ORDER — ACETAMINOPHEN 650 MG RE SUPP: 650.0000 mg | Freq: Four times a day (QID) | RECTAL | Status: DC | PRN

## 2017-03-14 MED ORDER — ZOLPIDEM TARTRATE 5 MG PO TABS
5.0000 mg | ORAL_TABLET | Freq: Every evening | ORAL | Status: DC | PRN
Start: 2017-03-14 — End: 2017-03-18

## 2017-03-14 MED ORDER — OXYCODONE-ACETAMINOPHEN 5-325 MG PO TABS
1.0000 | ORAL_TABLET | ORAL | Status: DC | PRN
  Administered 2017-03-18 (×2): 1 via ORAL
  Filled 2017-03-14 (×2): qty 1

## 2017-03-14 MED ORDER — FOLIC ACID 1 MG PO TABS
1.0000 mg | ORAL_TABLET | Freq: Every day | ORAL | Status: DC
  Administered 2017-03-15 – 2017-03-18 (×5): 1 mg via ORAL
  Filled 2017-03-14 (×5): qty 1

## 2017-03-14 MED ORDER — HYDRALAZINE HCL 20 MG/ML IJ SOLN: 5.0000 mg | INTRAMUSCULAR | Status: DC | PRN

## 2017-03-14 MED ORDER — SODIUM CHLORIDE 0.9% FLUSH
3.0000 mL | Freq: Two times a day (BID) | INTRAVENOUS | Status: DC
  Administered 2017-03-14 – 2017-03-18 (×5): 3 mL via INTRAVENOUS

## 2017-03-14 MED ORDER — FERROUS SULFATE 325 (65 FE) MG PO TABS
325.0000 mg | ORAL_TABLET | Freq: Every day | ORAL | Status: DC
  Administered 2017-03-15 – 2017-03-18 (×4): 325 mg via ORAL
  Filled 2017-03-14 (×3): qty 1

## 2017-03-14 MED ORDER — ALBUTEROL SULFATE HFA 108 (90 BASE) MCG/ACT IN AERS: 2.0000 | INHALATION_SPRAY | Freq: Four times a day (QID) | RESPIRATORY_TRACT | Status: DC | PRN

## 2017-03-14 MED ORDER — INSULIN GLARGINE 100 UNIT/ML ~~LOC~~ SOLN
50.0000 [IU] | Freq: Every day | SUBCUTANEOUS | Status: DC
  Administered 2017-03-15 – 2017-03-18 (×4): 50 [IU] via SUBCUTANEOUS
  Filled 2017-03-14 (×4): qty 0.5

## 2017-03-14 MED ORDER — ONDANSETRON HCL 4 MG/2ML IJ SOLN: 4.0000 mg | Freq: Three times a day (TID) | INTRAMUSCULAR | Status: DC | PRN

## 2017-03-14 MED ORDER — SODIUM CHLORIDE 0.9 % IV SOLN
500.0000 mg | Freq: Two times a day (BID) | INTRAVENOUS | Status: DC
  Administered 2017-03-15 (×3): 500 mg via INTRAVENOUS
  Filled 2017-03-14 (×5): qty 5

## 2017-03-14 MED ORDER — DULOXETINE HCL 30 MG PO CPEP
30.0000 mg | ORAL_CAPSULE | Freq: Every day | ORAL | Status: DC
  Administered 2017-03-15 – 2017-03-17 (×4): 30 mg via ORAL
  Filled 2017-03-14 (×4): qty 1

## 2017-03-14 NOTE — Progress Notes (Signed)
Pt arrived via [facility] to [facility] room [place]. Vital signs stable, no complaints at this time.  Awaiting MD consult post transfer. Will continue to monitor.

## 2017-03-14 NOTE — H&P (Addendum)
History and Physical    Caleb Joseph:[id number] DOB: Dec 18, 1955 DOA: 03/14/2017  Referring MD/NP/PA:   PCP: Chesley Noon, MD   Patient coming from:  The patient is coming from home.  At baseline, pt is independent for most of ADL.  Chief Complaint: fall, head injury, AMS  HPI: Caleb Joseph is a 61 y.o. male with medical history significant of hypertension, hyperlipidemia, diabetes mellitus, depression, tobacco abuse, resting tremor, who presents with fall, head injury and altered mental status.  Pt states that he woke up in the early morning, tried to go to bathroom, but tripped his steps and fell. He is not sure if he had LOC. He states that he injured his head and left arm. He has HA and pain in left shoulder, upper and forearm. He lay on the floor for he thinks several hours, unable to get up. A neighbor discovered him this morning on the floor. He reports he's had unsteady gait for several weeks, using his cane. He states that he has diabetic neuropathy, but no new unilateral weakness, numbness in extremities. No vision change or hearing loss. No slurred speech or facial droop. He reported that he had heartburn earlier this morning. He took 2 "heartburn pills" which alleviated his heartburn. He had nausea and vomited twice. No hematemesis or hematochezia. Patient states that he pulled his muscle in right groin area, and has intermittent pain over that area. No new abdominal pain or diarrhea. Denies symptoms of UTI. No fever or chills. Patient denies chest pain, cough or SOB. Currently patient has confusion, oriented to place, but missed year and president's name.  ED Course: pt was found to have WBC 10, negative troponin, negative urinalysis, creatinine normal, temperature normal, slightly tachycardia, oxygen saturation 100% on room air. MRI of brain is a truncated examination due to patient confusion and inability to lie flat, but showed a small, 1 cm subdural hematoma over the  posterior right convexity, no associated midline shift. Pt is admitted to [facility] as inpt. Neurosurgeon, PA, vincent Costella was consulted.  Review of Systems:   General: no fevers, chills, no changes in body weight, has fatigue HEENT: no blurry vision, hearing changes or sore throat Respiratory: no dyspnea, coughing, wheezing CV: no chest pain, no palpitations GI: has nausea, vomiting, no abdominal pain, diarrhea, constipation GU: no dysuria, burning on urination, increased urinary frequency, hematuria  Ext: no leg edema Neuro: no unilateral weakness, numbness, or tingling, no vision change or hearing loss. Has unsteady gait and resting tremor Skin: no rash, no skin tear. MSK: has pain in left arm and shoulder. Heme: No easy bruising.  Travel history: No recent long distant travel.  Allergy:  Allergies  Allergen Reactions  . Lisinopril Other (See Comments)    Caused upper arm and shoulder weakness  . Losartan Other (See Comments)    Caused upper arm and shoulder weakness    Past Medical History:  Diagnosis Date  . Abscess of prostate 02/17/2009  . Diabetes mellitus   . Diabetic foot infection ([place]) 12/26/2011  . 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 HYPERLIPIDEMIA 08/26/2007  . Hypertension   . 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 AMPUTATION  12/30/2011   Procedure: AMPUTATION DIGIT;  Surgeon: Marybelle Killings, MD;  Location: [facility];  Service: Orthopedics;  Laterality: Left;  Left [place]  . AMPUTATION  03/20/2012   Procedure: AMPUTATION DIGIT;  Surgeon: Mcarthur Rossetti, MD;  Location: [facility];  Service: Orthopedics;  Laterality: Left;  Left foot 3rd toe amputation  . CARDIAC CATHETERIZATION    . I&D EXTREMITY Left 09/28/2015   Procedure: IRRIGATION AND DRAINAGE OF LEFT FOOT with second and third metatarsal amputation;  Surgeon: Mcarthur Rossetti, MD;  Location: [facility];  Service: Orthopedics;   Laterality: Left;  . TOE AMPUTATION  08/2009   R little toe    Social History:  reports that he has been smoking Cigars and Pipe.  His smokeless tobacco use includes Chew and Snuff. He reports that he drinks alcohol. He reports that he does not use drugs.  Family History:  Family History  Problem Relation Age of Onset  . Diabetes Mother      Prior to Admission medications   Medication Sig Start Date End Date Taking? Authorizing Provider  albuterol (PROVENTIL HFA;VENTOLIN HFA) 108 (90 BASE) MCG/ACT inhaler Inhale 2 puffs into the lungs every 6 (six) hours as needed. For cold symptoms Patient taking differently: Inhale 2 puffs into the lungs every 6 (six) hours as needed (cold symptoms). For cold symptoms 12/16/12   Gerda Diss, DO  aspirin 81 MG tablet Take 1 tablet (81 mg total) by mouth daily. 03/17/13   Gerda Diss, DO  cholecalciferol (VITAMIN D) 1000 UNITS tablet Take 1,000 Units by mouth daily. 06/23/15   Historical Provider, MD  divalproex (DEPAKOTE ER) 250 MG 24 hr tablet Take 2 tablet of 500 mg and 1 tablet of 250 mg, total of 1250 mg every night. 10/14/16   Lavina Hamman, MD  divalproex (DEPAKOTE) 250 MG DR tablet Take 250 mg by mouth daily. 01/28/17   Historical Provider, MD  divalproex (DEPAKOTE) 500 MG DR tablet Take 2 tablet of 500 mg and 1 tablet of 250 mg, total of 1250 mg every night. 10/14/16   Lavina Hamman, MD  DULoxetine (CYMBALTA) 30 MG capsule Take 1 capsule (30 mg total) by mouth at bedtime. 12/16/12   Gerda Diss, DO  ferrous sulfate 325 (65 FE) MG tablet Take 1 tablet (325 mg total) by mouth daily with breakfast. 10/15/16   Lavina Hamman, MD  folic acid (FOLVITE) 086 MCG tablet Take 800 mcg by mouth daily.     Historical Provider, MD  gabapentin (NEURONTIN) 600 MG tablet Take 600 mg by mouth 2 (two) times daily. 01/28/17   Historical Provider, MD  ibuprofen (ADVIL,MOTRIN) 800 MG tablet Take 800 mg by mouth 2 (two) times daily as needed (pain).  06/11/16    Historical Provider, MD  Insulin Glargine (TOUJEO SOLOSTAR) 300 UNIT/ML SOPN Inject 90 Units into the skin every morning. And pen needles 3/day. 03/06/17   Renato Shin, MD  Insulin Pen Needle 31G X 8 MM MISC Use to inject insulin 3 times per day. 12/26/16   Renato Shin, MD  Lutein 10 MG TABS Take 10 mg by mouth daily.    Historical Provider, MD  metFORMIN (GLUCOPHAGE) 1000 MG tablet Take 1 tablet (1,000 mg total) by mouth 2 (two) times daily with a meal. 04/22/13   Gerda Diss, DO  omega-3 acid ethyl esters (LOVAZA) 1 G capsule Take 2 capsules (2 g total) by mouth 2 (two) times daily. 09/14/13   Gerda Diss, DO  rosuvastatin (CRESTOR) 5 MG tablet Take 1 tablet (5 mg total) by mouth daily. 03/17/13   Gerda Diss, DO  senna-docusate (SENOKOT-S) 8.6-50 MG tablet Take 1 tablet by mouth at bedtime as needed for mild constipation. 10/14/16  Lavina Hamman, MD    Physical Exam: Vitals:   03/14/17 1730 03/14/17 1909 03/14/17 1919 03/14/17 1945  BP: (!) 159/92 (!) 162/91  (!) 165/90  Pulse: 99 95    Resp:  19  (!) 22  Temp:   98.2 F (36.8 C)   TempSrc:      SpO2: 100% 100%     General: Not in acute distress HEENT:       Eyes: PERRL, EOMI, no scleral icterus.       ENT: No discharge from the ears and nose, no pharynx injection, no tonsillar enlargement.        Neck: No JVD, no bruit, no mass felt. Heme: No neck lymph node enlargement. Cardiac: S1/S2, RRR, No murmurs, No gallops or rubs. Respiratory: No rales, wheezing, rhonchi or rubs. GI: Soft, nondistended, nontender, no rebound pain, no organomegaly, BS present. GU: No hematuria Ext: No pitting leg edema bilaterally. 2+DP/PT pulse bilaterally. Musculoskeletal: has tenderness in left shoulder, left upper and forearm. No tenerderness in elbow. Skin: No rashes.  Neuro: Alert, mildly confused, oriented to place, but not to time and person. Cranial nerves II-XII grossly intact, moves all extremities normally. Muscle strength 5/5 in  all extremities, sensation to light touch intact. Brachial reflex 2+ bilaterally. Negative Babinski's sign. Has resting tremor. Psych: Patient is not psychotic, no suicidal or hemocidal ideation.  Labs on Admission: I have personally reviewed following labs and imaging studies  CBC:  Recent Labs Lab 03/14/17 1822 03/14/17 1836  WBC 10.0  --   NEUTROABS 8.2*  --   HGB 15.2 15.3  HCT 42.0 45.0  MCV 85.4  --   PLT 164  --    Basic Metabolic Panel:  Recent Labs Lab 03/14/17 1822 03/14/17 1836  NA 132* 131*  K 4.2 4.3  CL 99* 98*  CO2 23  --   GLUCOSE 201* 196*  BUN 11 11  CREATININE 0.87 0.80  CALCIUM 9.3  --    GFR: Estimated Creatinine Clearance: 130.3 mL/min (by C-G formula based on SCr of 0.8 mg/dL). Liver Function Tests:  Recent Labs Lab 03/14/17 1822  AST 20  ALT 12*  ALKPHOS 55  BILITOT 0.9  PROT 7.7  ALBUMIN 4.4   No results for input(s): LIPASE, AMYLASE in the last 168 hours. No results for input(s): AMMONIA in the last 168 hours. Coagulation Profile: No results for input(s): INR, PROTIME in the last 168 hours. Cardiac Enzymes:  Recent Labs Lab 03/14/17 1822  CKTOTAL 237   BNP (last 3 results) No results for input(s): PROBNP in the last 8760 hours. HbA1C: No results for input(s): HGBA1C in the last 72 hours. CBG: No results for input(s): GLUCAP in the last 168 hours. Lipid Profile: No results for input(s): CHOL, HDL, LDLCALC, TRIG, CHOLHDL, LDLDIRECT in the last 72 hours. Thyroid Function Tests: No results for input(s): TSH, T4TOTAL, FREET4, T3FREE, THYROIDAB in the last 72 hours. Anemia Panel: No results for input(s): VITAMINB12, FOLATE, FERRITIN, TIBC, IRON, RETICCTPCT in the last 72 hours. Urine analysis:    Component Value Date/Time   COLORURINE YELLOW 03/14/2017 1739   APPEARANCEUR CLEAR 03/14/2017 1739   LABSPEC 1.008 03/14/2017 1739   PHURINE 7.0 03/14/2017 1739   GLUCOSEU >=500 (A) 03/14/2017 1739   HGBUR NEGATIVE 03/14/2017  1739   HGBUR negative 05/20/2009 1155   BILIRUBINUR NEGATIVE 03/14/2017 1739   KETONESUR 20 (A) 03/14/2017 1739   PROTEINUR NEGATIVE 03/14/2017 1739   UROBILINOGEN 0.2 09/25/2015 2110   NITRITE NEGATIVE  03/14/2017 1739   LEUKOCYTESUR NEGATIVE 03/14/2017 1739   Sepsis Labs: @LABRCNTIP (procalcitonin:4,lacticidven:4) )No results found for this or any previous visit (from the past 240 hour(s)).   Radiological Exams on Admission: Mr Brain Wo Contrast  Result Date: 03/14/2017 CLINICAL DATA:  Found down status post fall EXAM: MRI HEAD WITHOUT CONTRAST TECHNIQUE: Multiplanar, multiecho pulse sequences of the brain and surrounding structures were obtained without intravenous contrast. COMPARISON:  Head CT 10/12/2016 FINDINGS: The examination had to be discontinued prior to completion due to patient inability to lie on his back and follow the technologist's instructions. Sagittal T1 weighted imaging and diffusion-weighted imaging with corresponding ADC maps were obtained. There is a 1 cm posterior right convexity subdural hematoma. The diffusion-weighted images are degraded by motion, but there is no visible evidence of ischemia. IMPRESSION: 1. Truncated examination due to patient confusion and inability to lie flat. 2. Small, 1 cm subdural hematoma over the posterior right convexity. No associated midline shift. Critical Value/emergent results were called by telephone at the time of interpretation on 03/14/2017 at 7:02 pm to Dr. Orlie Dakin , who verbally acknowledged these results. Electronically Signed   By: Ulyses Jarred M.D.   On: 03/14/2017 19:06     EKG: Independently reviewed, but EKG was not completely input to Epic.    Assessment/Plan Principal Problem:   SDH (subdural hematoma) (HCC) Active Problems:   Diabetes mellitus without complication (HCC)   Dyslipidemia   Depression   Type 2 diabetes mellitus ([place])   Fall   Tobacco abuse   Acute encephalopathy   SDH (subdural hematoma):  MRI of brain is a truncated examination due to patient confusion and inability to lie flat, but showed a small, 1 cm subdural hematoma over the posterior right convexity, no associated midline shift.  Neurosurgeon, PA [employer] was consulted-->"SDH does not appear operative at this time. Patient needs CT head wo contrast at 0500 tomorrow. He will need Keppra 500mg  BID  x7days. Neuro exams q 1 hour. Sooner CT as needed for worsening neuro status".  -will admit to [facility] as inpt -Highly appreciate neurosurgeons recommendations, will follow up further recommendations. -start Keppra 500mg  BID  x7days.  -Neuro check q 1 hour -swallowing screen test -NPO -IVF: 75 cc/h of NS -CT-head w/o contrast at 5:00 AM  Fall: Unclear etiology, likely due to mechanical fall in the setting of resting tremor and peripheral neuropathy. Ck 237. -pt/ot -consult to SW and CM -will get X-ray of left shoulder and arm -prn tylenol and percocet for pain  Acute encephalopathy: likely due to SDH. UA negative. No signs of infection -Frequent neuro check   DM-II: Last A1c 11.2, poorly controled. Patient is taking clonidine insulin, metformin at home -will decrease glargine insulin dose from 80 to 50 units a daily  -SSI  HLD: -Crestor  HTN: bp 162/91. Not taking meds at home. -IV hydralazine when necessary  Depression: Stable, no suicidal or homicidal ideations. -Continue home medications: Cymbalta and Depakote  Tobacco abuse: -Did counseling about importance of quitting smoking -Nicotine patch  GERD:  -start protonix   DVT ppx: SCD Code Status: Full code Family Communication: None at bed side.   Disposition Plan:  Anticipate discharge back to previous home environment Consults called:  [place], [place], Ferne Reus Admission status: SDU/inpation       Date of Service 03/14/2017    Elhadj Girton, [facility] Pager [phone]  If 7PM-7AM, please contact  night-coverage [email or web address] Password [facility] 03/14/2017, 8:15 PM

## 2017-03-14 NOTE — Care Management (Signed)
CM noted patient to be admitted and transferred to [facility]. Unit CM will follow up for Care Transition needs.

## 2017-03-14 NOTE — Progress Notes (Signed)
Received call in regards to patient who is at [facility] ER. He had tripped and fell sometime last night and hit his head. Neighbor found him this morning. Underwent stroke protocol so MRI brain ordered. MRI reveals small, 1cm SDH over right posterior convexity without midline shift. Pt is reportedly without focal deficits. Is unsteady on feet, but this chronic and he ambulates with a cane. I was advised by ER that pt is going to be admitted under hospitalist service and transferred to [facility]. SDH does not appear operative at this time. I will see/evaluate patient upon arrival to [facility]. Patient needs CT head wo contrast at 0500 tomorrow. He will need Keppra 500mg  BID  x7days. Neuro exams q 1 hour. Sooner CT as needed for worsening neuro status. Call for any concerns.

## 2017-03-14 NOTE — ED Notes (Signed)
Unsuccessful lab draw. RN made aware. 

## 2017-03-14 NOTE — ED Notes (Addendum)
This RN notified Dr. Lenna Sciara, patient passed swallow screen.

## 2017-03-14 NOTE — ED Notes (Signed)
Patient transported to MRI 

## 2017-03-14 NOTE — ED Provider Notes (Signed)
[facility] DEPT Provider Note   CSN: [id number] Arrival date & time: 03/14/17  1649     History   Chief Complaint Chief Complaint  Patient presents with  . Fall  . Head Injury    HPI Caleb Joseph is a 61 y.o. male.Patient reports he tripped and fell sometime last night striking his head against the door. He complains of mild left arm pain worse with moving his arm as well as headache since the event. He lay on the floor for he thinks several hours, unable to get up a neighbor discovered him this morning on the floor. He reports he's had unsteady gait for several weeks, using his cane. He denies chest pain or heartburn. He reported that he had heartburn earlier this morning he took 2 "heartburn pills" which alleviated his heartburn. He had slight episode of "regurgitation" upon arrival here.  HPI  Past Medical History:  Diagnosis Date  . Abscess of prostate 02/17/2009  . Diabetes mellitus   . Diabetic foot infection ([place]) 12/26/2011  . 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 HYPERLIPIDEMIA 08/26/2007  . Hypertension   . 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 Acute metabolic encephalopathy 52/84/1324  . Altered mental status, unspecified 10/12/2016  . Encephalopathy 10/12/2016  . DKA (diabetic ketoacidoses) ([place]) 09/25/2015  . Type 2 diabetes mellitus ([place]) 09/25/2015  . Hyponatremia 09/25/2015  . Lactic acidosis 09/25/2015  . Dysplastic nevus of skin 03/17/2013  . Hx of noncompliance with medical treatment, presenting hazards to health 12/16/2012  . ULNAR NEUROPATHY, RIGHT 06/02/2010  . Other specified disorder of male genital organs(608.89) 11/22/2009  . ELECTROCARDIOGRAM, ABNORMAL 11/22/2009  . PVD 07/19/2009  . DYSPNEA 07/19/2009  . 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 ANEMIA, VITAMIN B12 DEFICIENCY 07/17/2008  . DENTAL CARIES 07/07/2008  . DEPRESSION & PTSD, ?Bipolar  05/11/2008  . Diabetic neuropathy ([place]) 10/02/2007  . DIABETES MELLITUS, TYPE II, ON INSULIN, UNCONTROLLED 10/02/2007  . OLIGOSPERMIA 10/02/2007  . INGUINAL PAIN, RIGHT 10/02/2007  . Dyslipidemia 08/26/2007  . Essential hypertension 08/26/2007    Past Surgical History:  Procedure Laterality Date  . AMPUTATION  12/30/2011   Procedure: AMPUTATION DIGIT;  Surgeon: Marybelle Killings, MD;  Location: [facility];  Service: Orthopedics;  Laterality: Left;  Left Great  . AMPUTATION  03/20/2012   Procedure: AMPUTATION DIGIT;  Surgeon: Mcarthur Rossetti, MD;  Location: [facility];  Service: Orthopedics;  Laterality: Left;  Left foot 3rd toe amputation  . CARDIAC CATHETERIZATION    . I&D EXTREMITY Left 09/28/2015   Procedure: IRRIGATION AND DRAINAGE OF LEFT FOOT with second and third metatarsal amputation;  Surgeon: Mcarthur Rossetti, MD;  Location: [facility];  Service: Orthopedics;  Laterality: Left;  . TOE AMPUTATION  08/2009   R little toe       Home Medications    Prior to Admission medications   Medication Sig Start Date End Date Taking? Authorizing Provider  albuterol (PROVENTIL HFA;VENTOLIN HFA) 108 (90 BASE) MCG/ACT inhaler Inhale 2 puffs into the lungs every 6 (six) hours as needed. For cold symptoms Patient taking differently: Inhale 2 puffs into the lungs every 6 (six) hours as needed (cold symptoms). For cold symptoms 12/16/12   Gerda Diss, DO  aspirin 81 MG tablet Take 1 tablet (81 mg total) by mouth daily. 03/17/13   Gerda Diss, DO  cholecalciferol (VITAMIN D) 1000 UNITS tablet Take 1,000 Units by mouth daily. 06/23/15  Historical Provider, MD  divalproex (DEPAKOTE ER) 250 MG 24 hr tablet Take 2 tablet of 500 mg and 1 tablet of 250 mg, total of 1250 mg every night. 10/14/16   Lavina Hamman, MD  divalproex (DEPAKOTE) 500 MG DR tablet Take 2 tablet of 500 mg and 1 tablet of 250 mg, total of 1250 mg every night. 10/14/16   Lavina Hamman, MD  DULoxetine (CYMBALTA) 30 MG capsule Take 1  capsule (30 mg total) by mouth at bedtime. 12/16/12   Gerda Diss, DO  ferrous sulfate 325 (65 FE) MG tablet Take 1 tablet (325 mg total) by mouth daily with breakfast. 10/15/16   Lavina Hamman, MD  folic acid (FOLVITE) 706 MCG tablet Take 800 mcg by mouth daily.     Historical Provider, MD  gabapentin (NEURONTIN) 400 MG capsule Take 1 capsule (400 mg total) by mouth 2 (two) times daily. 10/15/16   Lavina Hamman, MD  ibuprofen (ADVIL,MOTRIN) 800 MG tablet Take 800 mg by mouth 2 (two) times daily as needed (pain).  06/11/16   Historical Provider, MD  Insulin Glargine (TOUJEO SOLOSTAR) 300 UNIT/ML SOPN Inject 90 Units into the skin every morning. And pen needles 3/day. 03/06/17   Renato Shin, MD  Insulin Pen Needle 31G X 8 MM MISC Use to inject insulin 3 times per day. 12/26/16   Renato Shin, MD  Lutein 10 MG TABS Take 10 mg by mouth daily.    Historical Provider, MD  metFORMIN (GLUCOPHAGE) 1000 MG tablet Take 1 tablet (1,000 mg total) by mouth 2 (two) times daily with a meal. 04/22/13   Gerda Diss, DO  omega-3 acid ethyl esters (LOVAZA) 1 G capsule Take 2 capsules (2 g total) by mouth 2 (two) times daily. 09/14/13   Gerda Diss, DO  rosuvastatin (CRESTOR) 5 MG tablet Take 1 tablet (5 mg total) by mouth daily. 03/17/13   Gerda Diss, DO  senna-docusate (SENOKOT-S) 8.6-50 MG tablet Take 1 tablet by mouth at bedtime as needed for mild constipation. 10/14/16   Lavina Hamman, MD    Family History Family History  Problem Relation Age of Onset  . Diabetes Mother     Social History Social History  Substance Use Topics  . Smoking status: Current Some Day Smoker    Types: Cigars, Pipe  . Smokeless tobacco: Current User    Types: Chew, Snuff     Comment: occasional chew and cigarette, pipe, cigar user  . Alcohol use Yes     Comment: occasional     Allergies   Lisinopril and Losartan   Review of Systems Review of Systems  Constitutional: Negative.   HENT: Negative.     Respiratory: Negative.   Cardiovascular: Negative.   Gastrointestinal: Negative.   Musculoskeletal: Positive for arthralgias and gait problem.       Diffuse left arm pain  Skin: Negative.   Allergic/Immunologic: Positive for immunocompromised state.  Neurological: Positive for tremors.       Chronic tremor  Psychiatric/Behavioral: Negative.      Physical Exam Updated Vital Signs BP (!) 141/92   Pulse 100   Temp 98 F (36.7 C) (Oral)   Resp 18   SpO2 100%   Physical Exam  Constitutional: He is oriented to person, place, and time.  Chronically ill-appearing  HENT:  Head: Normocephalic and atraumatic.  Mucous membranes dry  Eyes: Conjunctivae are normal. Pupils are equal, round, and reactive to light.  Neck: Neck supple. No tracheal  deviation present. No thyromegaly present.  Cardiovascular: Normal rate and regular rhythm.   No murmur heard. Pulmonary/Chest: Effort normal and breath sounds normal.  Abdominal: Soft. Bowel sounds are normal. He exhibits no distension. There is no tenderness.  Musculoskeletal: Normal range of motion. He exhibits no edema or tenderness.  Neurological: He is alert and oriented to person, place, and time. Coordination normal.  Tremor at rest of left hand. Walks with cane, gait unsteady  Skin: Skin is warm and dry. No rash noted.  Psychiatric: He has a normal mood and affect.  Nursing note and vitals reviewed.    ED Treatments / Results  Labs (all labs ordered are listed, but only abnormal results are displayed) Labs Reviewed  ETHANOL  PROTIME-INR  APTT  CBC  DIFFERENTIAL  COMPREHENSIVE METABOLIC PANEL  RAPID URINE DRUG SCREEN, HOSP PERFORMED  URINALYSIS, ROUTINE W REFLEX MICROSCOPIC  URINALYSIS, COMPLETE (UACMP) WITH MICROSCOPIC  CK  I-STAT CHEM 8, ED  I-STAT TROPOININ, ED    EKG  EKG Interpretation  Date/Time:  Thursday March 14 2017 19:08:45 EDT Ventricular Rate:  96 PR Interval:    QRS Duration: 141 QT  Interval:  368 QTC Calculation: 465 R Axis:   -74 Text Interpretation:  Sinus rhythm Borderline prolonged PR interval RBBB and LAFB No significant change since last tracing Confirmed by Winfred Leeds  MD, Jobanny Mavis [phone]) on 03/14/2017 7:21:40 PM      Results for orders placed or performed during the hospital encounter of 03/14/17  Ethanol  Result Value Ref Range   Alcohol, Ethyl (B) <5 <5 mg/dL  CBC  Result Value Ref Range   WBC 10.0 4.0 - 10.5 K/uL   RBC 4.92 4.22 - 5.81 MIL/uL   Hemoglobin 15.2 13.0 - 17.0 g/dL   HCT 42.0 39.0 - 52.0 %   MCV 85.4 78.0 - 100.0 fL   MCH 30.9 26.0 - 34.0 pg   MCHC 36.2 (H) 30.0 - 36.0 g/dL   RDW 12.0 11.5 - 15.5 %   Platelets 164 150 - 400 K/uL  Differential  Result Value Ref Range   Neutrophils Relative % 81 %   Neutro Abs 8.2 (H) 1.7 - 7.7 K/uL   Lymphocytes Relative 13 %   Lymphs Abs 1.3 0.7 - 4.0 K/uL   Monocytes Relative 5 %   Monocytes Absolute 0.5 0.1 - 1.0 K/uL   Eosinophils Relative 1 %   Eosinophils Absolute 0.1 0.0 - 0.7 K/uL   Basophils Relative 0 %   Basophils Absolute 0.0 0.0 - 0.1 K/uL  Comprehensive metabolic panel  Result Value Ref Range   Sodium 132 (L) 135 - 145 mmol/L   Potassium 4.2 3.5 - 5.1 mmol/L   Chloride 99 (L) 101 - 111 mmol/L   CO2 23 22 - 32 mmol/L   Glucose, Bld 201 (H) 65 - 99 mg/dL   BUN 11 6 - 20 mg/dL   Creatinine, Ser 0.87 0.61 - 1.24 mg/dL   Calcium 9.3 8.9 - 10.3 mg/dL   Total Protein 7.7 6.5 - 8.1 g/dL   Albumin 4.4 3.5 - 5.0 g/dL   AST 20 15 - 41 U/L   ALT 12 (L) 17 - 63 U/L   Alkaline Phosphatase 55 38 - 126 U/L   Total Bilirubin 0.9 0.3 - 1.2 mg/dL   GFR calc non Af Amer >60 >60 mL/min   GFR calc Af Amer >60 >60 mL/min   Anion gap 10 5 - 15  Urine rapid drug screen (hosp performed)not at [facility]  Result Value Ref Range   Opiates NONE DETECTED NONE DETECTED   Cocaine NONE DETECTED NONE DETECTED   Benzodiazepines NONE DETECTED NONE DETECTED   Amphetamines NONE DETECTED NONE DETECTED    Tetrahydrocannabinol NONE DETECTED NONE DETECTED   Barbiturates NONE DETECTED NONE DETECTED  Urinalysis, Complete w Microscopic  Result Value Ref Range   Color, Urine YELLOW YELLOW   APPearance CLEAR CLEAR   Specific Gravity, Urine 1.008 1.005 - 1.030   pH 7.0 5.0 - 8.0   Glucose, UA >=500 (A) NEGATIVE mg/dL   Hgb urine dipstick NEGATIVE NEGATIVE   Bilirubin Urine NEGATIVE NEGATIVE   Ketones, ur 20 (A) NEGATIVE mg/dL   Protein, ur NEGATIVE NEGATIVE mg/dL   Nitrite NEGATIVE NEGATIVE   Leukocytes, UA NEGATIVE NEGATIVE   RBC / HPF 0-5 0 - 5 RBC/hpf   WBC, UA 0-5 0 - 5 WBC/hpf   Bacteria, UA NONE SEEN NONE SEEN   Squamous Epithelial / LPF 0-5 (A) NONE SEEN  CK  Result Value Ref Range   Total CK 237 49 - 397 U/L  I-Stat Chem 8, ED  (not at [facility])  Result Value Ref Range   Sodium 131 (L) 135 - 145 mmol/L   Potassium 4.3 3.5 - 5.1 mmol/L   Chloride 98 (L) 101 - 111 mmol/L   BUN 11 6 - 20 mg/dL   Creatinine, Ser 0.80 0.61 - 1.24 mg/dL   Glucose, Bld 196 (H) 65 - 99 mg/dL   Calcium, Ion 1.13 (L) 1.15 - 1.40 mmol/L   TCO2 24 0 - 100 mmol/L   Hemoglobin 15.3 13.0 - 17.0 g/dL   HCT 45.0 39.0 - 52.0 %  I-stat troponin, ED (not at [facility], [facility])  Result Value Ref Range   Troponin i, poc 0.00 0.00 - 0.08 ng/mL   Comment 3           Mr Brain Wo Contrast  Result Date: 03/14/2017 CLINICAL DATA:  Found down status post fall EXAM: MRI HEAD WITHOUT CONTRAST TECHNIQUE: Multiplanar, multiecho pulse sequences of the brain and surrounding structures were obtained without intravenous contrast. COMPARISON:  Head CT 10/12/2016 FINDINGS: The examination had to be discontinued prior to completion due to patient inability to lie on his back and follow the technologist's instructions. Sagittal T1 weighted imaging and diffusion-weighted imaging with corresponding ADC maps were obtained. There is a 1 cm posterior right convexity subdural hematoma. The diffusion-weighted images are degraded by motion, but  there is no visible evidence of ischemia. IMPRESSION: 1. Truncated examination due to patient confusion and inability to lie flat. 2. Small, 1 cm subdural hematoma over the posterior right convexity. No associated midline shift. Critical Value/emergent results were called by telephone at the time of interpretation on 03/14/2017 at 7:02 pm to Dr. Orlie Dakin , who verbally acknowledged these results. Electronically Signed   By: Ulyses Jarred M.D.   On: 03/14/2017 19:06   Radiology No results found.  Procedures Procedures (including critical care time)  Medications Ordered in ED Medications - No data to display  7:45 PM patient is alert Glasgow Coma Score 15. I consulted neurosurgical service and spoke with Mr.Costella, PA who states that subdural hematoma is not a surgical lesion presently, and no further emergent imaging of the brain currently needed. He is requesting admission to hospitalist service. Neurosurgery will consult on case. He requests transfer to [place] consulted Dr.Niu for hospitalist service who will arrange for transfer to [facility] stepdown unit under care of  Dr.Gardner  Initial Impression / Assessment and Plan / ED Course  I have reviewed the triage vital signs and the nursing notes.  Pertinent labs & imaging results that were available during my care of the patient were reviewed by me and considered in my medical decision making (see chart for details).     Patient passed swallowing screen. There is no evidence of significant rhabdomyolysis.  Final Clinical Impressions(s) / ED Diagnoses  Diagnosis #1 subdural hematoma #2 fall #3 hyperglycemia Final diagnoses:  None  CRITICAL CARE Performed by: Orlie Dakin Total critical care time: 30 minutes Critical care time was exclusive of separately billable procedures and treating other patients. Critical care was necessary to treat or prevent imminent or life-threatening deterioration. Critical  care was time spent personally by me on the following activities: development of treatment plan with patient and/or surrogate as well as nursing, discussions with consultants, evaluation of patient's response to treatment, examination of patient, obtaining history from patient or surrogate, ordering and performing treatments and interventions, ordering and review of laboratory studies, ordering and review of radiographic studies, pulse oximetry and re-evaluation of patient's condition.  New Prescriptions New Prescriptions   No medications on file     Orlie Dakin, MD 03/14/17 2000

## 2017-03-14 NOTE — Consult Note (Signed)
CC:  Chief Complaint  Patient presents with  . Fall  . Head Injury    HPI: Caleb Joseph is a 61 y.o. male who presented to ER for a fall. He reports he fell early this morning but is unable to give a time. States he hit his head against the door. Unsure if he lost consciousness. Stayed on the ground for several hours before calling a neighbor.  Feels well now with the exception of LUE pain. Denies any neurological symptoms or deficits.  Pt is a poor historian.  PMH: Past Medical History:  Diagnosis Date  . Abscess of prostate 02/17/2009  . Diabetes mellitus   . Diabetic foot infection ([place]) 12/26/2011  . 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 HYPERLIPIDEMIA 08/26/2007  . Hypertension   . 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 AMPUTATION  12/30/2011   Procedure: AMPUTATION DIGIT;  Surgeon: Marybelle Killings, MD;  Location: [facility];  Service: Orthopedics;  Laterality: Left;  Left Great  . AMPUTATION  03/20/2012   Procedure: AMPUTATION DIGIT;  Surgeon: Mcarthur Rossetti, MD;  Location: [facility];  Service: Orthopedics;  Laterality: Left;  Left foot 3rd toe amputation  . CARDIAC CATHETERIZATION    . I&D EXTREMITY Left 09/28/2015   Procedure: IRRIGATION AND DRAINAGE OF LEFT FOOT with second and third metatarsal amputation;  Surgeon: Mcarthur Rossetti, MD;  Location: [facility];  Service: Orthopedics;  Laterality: Left;  . TOE AMPUTATION  08/2009   R little toe    SH: Social History  Substance Use Topics  . Smoking status: Current Some Day Smoker    Types: Cigars, Pipe  . Smokeless tobacco: Current User    Types: Chew, Snuff     Comment: occasional chew and cigarette, pipe, cigar user  . Alcohol use Yes     Comment: occasional    MEDS: Prior to Admission medications   Medication Sig Start Date End Date Taking? Authorizing Provider  albuterol (PROVENTIL HFA;VENTOLIN HFA) 108 (90 BASE) MCG/ACT inhaler  Inhale 2 puffs into the lungs every 6 (six) hours as needed. For cold symptoms Patient taking differently: Inhale 2 puffs into the lungs every 6 (six) hours as needed (cold symptoms). For cold symptoms 12/16/12  Yes Gerda Diss, DO  aspirin 81 MG tablet Take 1 tablet (81 mg total) by mouth daily. 03/17/13  Yes Gerda Diss, DO  cholecalciferol (VITAMIN D) 1000 UNITS tablet Take 1,000 Units by mouth daily. 06/23/15  Yes Historical Provider, MD  divalproex (DEPAKOTE ER) 250 MG 24 hr tablet Take 2 tablet of 500 mg and 1 tablet of 250 mg, total of 1250 mg every night. 10/14/16  Yes Lavina Hamman, MD  divalproex (DEPAKOTE) 500 MG DR tablet Take 2 tablet of 500 mg and 1 tablet of 250 mg, total of 1250 mg every night. 10/14/16  Yes Lavina Hamman, MD  DULoxetine (CYMBALTA) 30 MG capsule Take 1 capsule (30 mg total) by mouth at bedtime. 12/16/12  Yes Gerda Diss, DO  ferrous sulfate 325 (65 FE) MG tablet Take 1 tablet (325 mg total) by mouth daily with breakfast. 10/15/16  Yes Lavina Hamman, MD  folic acid (FOLVITE) 277 MCG tablet Take 800 mcg by mouth daily.    Yes Historical Provider, MD  gabapentin (NEURONTIN) 600 MG tablet Take 600 mg by mouth 2 (two) times daily. 01/28/17  Yes Historical Provider, MD  ibuprofen (ADVIL,MOTRIN) 800 MG  tablet Take 800 mg by mouth 2 (two) times daily as needed (pain).  06/11/16  Yes Historical Provider, MD  Insulin Glargine (TOUJEO SOLOSTAR) 300 UNIT/ML SOPN Inject 90 Units into the skin every morning. And pen needles 3/day. Patient taking differently: Inject 80 Units into the skin every morning. And pen needles 3/day. 03/06/17  Yes Renato Shin, MD  metFORMIN (GLUCOPHAGE) 1000 MG tablet Take 1 tablet (1,000 mg total) by mouth 2 (two) times daily with a meal. 04/22/13  Yes Gerda Diss, DO  omega-3 acid ethyl esters (LOVAZA) 1 G capsule Take 2 capsules (2 g total) by mouth 2 (two) times daily. 09/14/13  Yes Gerda Diss, DO  rosuvastatin (CRESTOR) 5 MG tablet Take  1 tablet (5 mg total) by mouth daily. 03/17/13  Yes Gerda Diss, DO  Insulin Pen Needle 31G X 8 MM MISC Use to inject insulin 3 times per day. 12/26/16   Renato Shin, MD  senna-docusate (SENOKOT-S) 8.6-50 MG tablet Take 1 tablet by mouth at bedtime as needed for mild constipation. Patient not taking: Reported on 03/14/2017 10/14/16   Lavina Hamman, MD    ALLERGY: Allergies  Allergen Reactions  . Lisinopril Other (See Comments)    Caused upper arm and shoulder weakness  . Losartan Other (See Comments)    Caused upper arm and shoulder weakness    ROS: Review of Systems  Constitutional: Negative.   HENT: Negative.   Eyes: Negative.   Respiratory: Negative.   Cardiovascular: Negative.   Gastrointestinal: Negative.   Genitourinary: Negative.   Musculoskeletal: Positive for joint pain (LUE pain) and myalgias. Negative for back pain, falls and neck pain.  Skin: Negative.   Neurological: Negative for dizziness, tingling, tremors, sensory change, speech change, focal weakness, seizures, loss of consciousness and headaches.    Vitals:   03/14/17 1945 03/14/17 2030  BP: (!) 165/90 (!) 130/92  Pulse:    Resp: (!) 22 (!) 0  Temp:     General appearance: WDWN, NAD, resting tremor (chronic) Eyes: PERRL Cardiovascular: Regular rate and rhythm without murmurs, rubs, gallops. No edema or variciosities. Distal pulses normal. Pulmonary: Clear to auscultation Musculoskeletal:     Muscle tone upper extremities: Normal    Muscle tone lower extremities: Normal    Motor exam: Upper Extremities Deltoid Bicep Tricep Grip  Right 5/5 5/5 5/5 5/5  Left 5/5 5/5 5/5 5/5   Lower Extremity IP Quad PF DF EHL  Right 5/5 5/5 5/5 5/5 5/5  Left 5/5 5/5 5/5 5/5 5/5   Neurological Awake, alert, oriented Memory and concentration grossly intact Speech fluent, appropriate CNII: Visual fields normal CNIII/IV/VI: EOMI CNV: Facial sensation normal CNVII: Symmetric, normal strength CNVIII: Grossly  normal CNIX: Normal palate movement CNXI: Trap and SCM strength normal CN XII: Tongue protrusion normal Sensation grossly intact to LT DTR: Normal  IMAGING: MRI Brain IMPRESSION: 1. Truncated examination due to patient confusion and inability to lie flat. 2. Small, 1 cm subdural hematoma over the posterior right convexity. No associated midline shift.  IMPRESSION: - 61 y.o. male with small SDH. He is neurologically intact and without deficits. Head injury >12 hours ago and still remains neurologically intact. Admitted under hospitalist service. Will monitor neuro exam q 2 hours. Repeat CT scan tomorrow am, sooner as needed for worsening neuro exam. Keppra 500mg  BID for seizure prophylaxis. Call for any concerns.

## 2017-03-14 NOTE — Care Management (Addendum)
CM received call from EDP at [facility] concerning possible [place] needs if ED evaluations is neg for acute finding. ED evaluation still in progress. CM will continue to follow for care transition planning.

## 2017-03-14 NOTE — ED Triage Notes (Addendum)
Pt from home via EMS with complaints of head injury following a fall. Pt states he he thinks he just tripped, but pt was found by a neighbor this morning at daybreak and pt had crawled into his living room. Therefore the fall was likely sometime last night, but pt is unsure exactly when it was. Pt denies blood thinner use. Pt states he has head pain, and left shoulder pain. Per EMS pt denies n,v,d, cp. During triage pt began to experience emesis. Pt states he has heart burn. Pt is alert and oriented x 3. Pt is unable to identify president or year. Pt was disheveled and experienced incontinence at time of assessment.

## 2017-03-15 ENCOUNTER — Encounter (HOSPITAL_COMMUNITY): Payer: Self-pay

## 2017-03-15 ENCOUNTER — Inpatient Hospital Stay (HOSPITAL_COMMUNITY): Payer: Medicare HMO

## 2017-03-15 DIAGNOSIS — I10 Essential (primary) hypertension: Secondary | ICD-10-CM | POA: Diagnosis not present

## 2017-03-15 DIAGNOSIS — E119 Type 2 diabetes mellitus without complications: Secondary | ICD-10-CM | POA: Diagnosis not present

## 2017-03-15 DIAGNOSIS — I6200 Nontraumatic subdural hemorrhage, unspecified: Secondary | ICD-10-CM | POA: Diagnosis not present

## 2017-03-15 DIAGNOSIS — S065X9A Traumatic subdural hemorrhage with loss of consciousness of unspecified duration, initial encounter: Secondary | ICD-10-CM | POA: Diagnosis present

## 2017-03-15 DIAGNOSIS — S065XAA Traumatic subdural hemorrhage with loss of consciousness status unknown, initial encounter: Secondary | ICD-10-CM | POA: Diagnosis present

## 2017-03-15 DIAGNOSIS — W19XXXA Unspecified fall, initial encounter: Secondary | ICD-10-CM | POA: Diagnosis not present

## 2017-03-15 DIAGNOSIS — E785 Hyperlipidemia, unspecified: Secondary | ICD-10-CM | POA: Diagnosis not present

## 2017-03-15 LAB — GLUCOSE, CAPILLARY
GLUCOSE-CAPILLARY: 131 mg/dL — AB (ref 65–99)
GLUCOSE-CAPILLARY: 124 mg/dL — AB (ref 65–99)
GLUCOSE-CAPILLARY: 144 mg/dL — AB (ref 65–99)
Glucose-Capillary: 135 mg/dL — ABNORMAL HIGH (ref 65–99)
Glucose-Capillary: 122 mg/dL — ABNORMAL HIGH (ref 65–99)

## 2017-03-15 LAB — BASIC METABOLIC PANEL
Anion gap: 12 (ref 5–15)
BUN: 8 mg/dL (ref 6–20)
CHLORIDE: 97 mmol/L — AB (ref 101–111)
CO2: 22 mmol/L (ref 22–32)
CREATININE: 0.81 mg/dL (ref 0.61–1.24)
Calcium: 9 mg/dL (ref 8.9–10.3)
GFR calc Af Amer: >60 mL/min (ref >60)
GFR calc non Af Amer: >60 mL/min (ref >60)
Glucose, Bld: 154 mg/dL — ABNORMAL HIGH (ref 65–99)
Potassium: 3.8 mmol/L (ref 3.5–5.1)
SODIUM: 131 mmol/L — AB (ref 135–145)

## 2017-03-15 LAB — CBC
HCT: 37.5 % — ABNORMAL LOW (ref 39.0–52.0)
HEMOGLOBIN: 13.4 g/dL (ref 13.0–17.0)
MCH: 30.5 pg (ref 26.0–34.0)
MCHC: 35.7 g/dL (ref 30.0–36.0)
MCV: 85.2 fL (ref 78.0–100.0)
Platelets: 148 10*3/uL — ABNORMAL LOW (ref 150–400)
RBC: 4.4 MIL/uL (ref 4.22–5.81)
RDW: 12.3 % (ref 11.5–15.5)
WBC: 7.1 10*3/uL (ref 4.0–10.5)

## 2017-03-15 LAB — MRSA PCR SCREENING: MRSA by PCR: NEGATIVE

## 2017-03-15 MED ORDER — INSULIN GLARGINE 300 UNIT/ML ~~LOC~~ SOPN
80.0000 [IU] | PEN_INJECTOR | SUBCUTANEOUS | Status: DC
Start: 2017-03-15 — End: 2017-05-03

## 2017-03-15 MED ORDER — LEVETIRACETAM 500 MG PO TABS: 500.0000 mg | ORAL_TABLET | Freq: Two times a day (BID) | ORAL | Status: DC

## 2017-03-15 NOTE — Evaluation (Signed)
Occupational Therapy Evaluation Patient Details Name: MATHESON VANDEHEI MRN: [id number] DOB: 1955/12/01 Today's Date: 03/15/2017    History of Present Illness Pt admitted with AMS and small SDH after a fall at home. PMH: DM, peripheral neuropathy, HTN, R toe amputation, depression, tremor.   Clinical Impression   Pt was independent and walked with a cane prior to admission. Presents with impaired cognition, poor standing balance and generalized weakness interfering with ability to perform self care and mobility at his baseline. Pt lives alone and is not safe at this point to return without 24 hour assistance. Recommending SNF for ST rehab.    Follow Up Recommendations  SNF;Supervision/Assistance - 24 hour    Equipment Recommendations       Recommendations for Other Services       Precautions / Restrictions Precautions Precautions: Fall Restrictions Weight Bearing Restrictions: No      Mobility Bed Mobility Overal bed mobility: Needs Assistance Bed Mobility: Supine to Sit     Supine to sit: Min guard;HOB elevated     General bed mobility comments: increased time, use of rail  Transfers Overall transfer level: Needs assistance Equipment used: Rolling walker (2 wheeled) Transfers: Sit to/from Stand Sit to Stand: Min assist;+2 safety/equipment         General transfer comment: for safety and stability    Balance Overall balance assessment: Needs assistance   Sitting balance-Leahy Scale: Fair     Standing balance support: During functional activity Standing balance-Leahy Scale: Poor Standing balance comment: posterior lean with attempt to use urinal at bedside                           ADL either performed or assessed with clinical judgement   ADL Overall ADL's : Needs assistance/impaired Eating/Feeding: NPO   Grooming: Set up;Sitting   Upper Body Bathing: Minimal assistance;Sitting   Lower Body Bathing: Sit to/from stand;Moderate assistance    Upper Body Dressing : Moderate assistance;Sitting   Lower Body Dressing: Moderate assistance;Sit to/from stand               Functional mobility during ADLs: Rolling walker;Cueing for safety;Cueing for sequencing;Moderate assistance       Vision Baseline Vision/History: Wears glasses Wears Glasses: At all times Patient Visual Report: No change from baseline       Perception     Praxis      Pertinent Vitals/Pain Pain Assessment: No/denies pain     Hand Dominance Right   Extremity/Trunk Assessment Upper Extremity Assessment Upper Extremity Assessment: Generalized weakness (L UE tremor at baseline)   Lower Extremity Assessment Lower Extremity Assessment: Defer to PT evaluation       Communication Communication Communication: No difficulties   Cognition Arousal/Alertness: Awake/alert (sleepy) Behavior During Therapy: Flat affect Overall Cognitive Status: Impaired/Different from baseline Area of Impairment: Orientation;Memory;Following commands;Safety/judgement;Problem solving                 Orientation Level: Disoriented to;Time   Memory: Decreased short-term memory Following Commands: Follows one step commands with increased time Safety/Judgement: Decreased awareness of safety;Decreased awareness of deficits   Problem Solving: Slow processing;Decreased initiation;Difficulty sequencing;Requires verbal cues;Requires tactile cues     General Comments       Exercises     Shoulder Instructions      Home Living Family/patient expects to be discharged to:: Private residence Living Arrangements: Alone Available Help at Discharge: Friend(s);Available PRN/intermittently Type of Home: Mobile home Home Access: Stairs to enter  Entrance Stairs-Number of Steps: 7 Entrance Stairs-Rails: Right;Left Home Layout: One level     Bathroom Shower/Tub: Teacher, early years/pre: Standard     Home Equipment: Cane - single point          Prior  Functioning/Environment Level of Independence: Independent with assistive device(s)        Comments: uses cane, drives        OT Problem List: Decreased strength;Decreased activity tolerance;Impaired balance (sitting and/or standing);Decreased coordination;Decreased cognition;Decreased safety awareness;Decreased knowledge of use of DME or AE      OT Treatment/Interventions: Self-care/ADL training;DME and/or AE instruction;Patient/family education;Balance training;Therapeutic activities;Cognitive remediation/compensation    OT Goals(Current goals can be found in the care plan section) Acute Rehab OT Goals Patient Stated Goal: to return home to his cat OT Goal Formulation: With patient Time For Goal Achievement: 03/29/17 Potential to Achieve Goals: Good ADL Goals Pt Will Perform Grooming: with supervision;standing Pt Will Perform Upper Body Dressing: with supervision;sitting Pt Will Perform Lower Body Dressing: with supervision;sit to/from stand Pt Will Transfer to Toilet: with supervision;ambulating;regular height toilet Pt Will Perform Toileting - Clothing Manipulation and hygiene: with supervision;sit to/from stand Additional ADL Goal #1: pt will use environmental cues for orientation to time.  OT Frequency: Min 2X/week   Barriers to D/C: Decreased caregiver support          Co-evaluation PT/OT/SLP Co-Evaluation/Treatment: Yes Reason for Co-Treatment: For patient/therapist safety   OT goals addressed during session: ADL's and self-care      End of Session Equipment Utilized During Treatment: Gait belt;Rolling walker  Activity Tolerance: Patient tolerated treatment well Patient left: in chair;with call bell/phone within reach;with chair alarm set  OT Visit Diagnosis: Unsteadiness on feet (R26.81);Other abnormalities of gait and mobility (R26.89);History of falling (Z91.81);Other symptoms and signs involving cognitive function                Time: 8676-1950 OT Time  Calculation (min): 29 min Charges:  OT General Charges $OT Visit: 1 Procedure OT Evaluation $OT Eval Moderate Complexity: 1 Procedure G-Codes:      Malka So 03/15/2017, 9:53 AM  [phone]

## 2017-03-15 NOTE — Clinical Social Work Note (Signed)
CSW left voicemail for patient's brother, Chrissie Noa. CSW tried calling his brother Tyrone Schimke but mobile phone number is not active and no answering machine for home number.  Dayton Scrape, [place]

## 2017-03-15 NOTE — Care Management CC44 (Signed)
Condition Code 44 Documentation Completed  Patient Details  Name: Caleb Joseph MRN: [id number] Date of Birth: 03/05/1956   Condition Code 44 given:  Yes Patient signature on Condition Code 44 notice:  Yes Documentation of 2 MD's agreement:  Yes Code 44 added to claim:  Yes    Zenon Mayo, RN 03/15/2017, 3:08 PM

## 2017-03-15 NOTE — Progress Notes (Signed)
Repeat CT head is stable. Discussed with Dr Waldron Labs  Pt is neuro intact No need for NS intervention. Complete 7 day course of Keppra F/u outpt 4 weeks

## 2017-03-15 NOTE — Clinical Social Work Note (Addendum)
CSW faxed H&P, FL2, and 30 day note to [place] [facility] for review for PASARR. It has not been requested yet.  Dayton Scrape, CSW [phone]  3:01 pm [facility] confirmed that they received the clinicals that were faxed over. It has been assigned to a case manager for review. PASARR is still pending. Patient cannot discharge to SNF without it.  Dayton Scrape, CSW [phone]  4:49 pm Still no PASARR. Insurance authorization obtained: [id number] rug level RUB. SNF aware. MD aware. [facility] can take patient tomorrow if PASARR available.  Dayton Scrape, [place]

## 2017-03-15 NOTE — Progress Notes (Signed)
Patient refusing bed alarm at this time despite being admitted with a fall. Educated on the importance of not getting up without assistance and using the call bell.  Joellen Jersey, RN

## 2017-03-15 NOTE — Discharge Summary (Signed)
Caleb Joseph, is a 61 y.o. male  DOB September 30, 1956  MRN [id number].  Admission date:  03/14/2017  Admitting Physician  Ivor Costa, MD  Discharge Date:  03/15/2017   Primary MD  Chesley Noon, MD  Recommendations for primary care physician for things to follow:  - Patient to follow with neurosurgery in 4 weeks from discharge   Admission Diagnosis  Subdural hematoma ([place]) [I62.00] SDH (subdural hematoma) ([place]) [I62.00] Fall [W19.XXXA]   Discharge Diagnosis  Subdural hematoma ([place]) [I62.00] SDH (subdural hematoma) ([place]) [I62.00] Fall [W19.XXXA]    Principal Problem:   SDH (subdural hematoma) (HCC) Active Problems:   Diabetes mellitus without complication (HCC)   Dyslipidemia   Depression   Essential hypertension   Type 2 diabetes mellitus ([place])   Fall   Tobacco abuse   Acute encephalopathy   GERD (gastroesophageal reflux disease)      Past Medical History:  Diagnosis Date  . Abscess of prostate 02/17/2009  . Diabetes mellitus   . Diabetic foot infection ([place]) 12/26/2011  . 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 HYPERLIPIDEMIA 08/26/2007  . Hypertension   . 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 AMPUTATION  12/30/2011   Procedure: AMPUTATION DIGIT;  Surgeon: Marybelle Killings, MD;  Location: [facility];  Service: Orthopedics;  Laterality: Left;  Left [place]  . AMPUTATION  03/20/2012   Procedure: AMPUTATION DIGIT;  Surgeon: Mcarthur Rossetti, MD;  Location: [facility];  Service: Orthopedics;  Laterality: Left;  Left foot 3rd toe amputation  . CARDIAC CATHETERIZATION    . I&D EXTREMITY Left 09/28/2015   Procedure: IRRIGATION AND DRAINAGE OF LEFT FOOT with second and third metatarsal amputation;  Surgeon: Mcarthur Rossetti, MD;  Location: [facility];  Service: Orthopedics;  Laterality: Left;  . TOE AMPUTATION  08/2009   R  little toe       History of present illness and  Hospital Course:     Kindly see H&P for history of present illness and admission details, please review complete Labs, Consult reports and Test reports for all details in brief  HPI  from the history and physical done on the day of admission 03/14/2017  HPI: Caleb Joseph is a 61 y.o. male with medical history significant of hypertension, hyperlipidemia, diabetes mellitus, depression, tobacco abuse, resting tremor, who presents with fall, head injury and altered mental status.  Pt states that he woke up in the early morning, tried to go to bathroom, but tripped his steps and fell. He is not sure if he had LOC. He states that he injured his head and left arm. He has HA and pain in left shoulder, upper and forearm. He lay on the floor for he thinks several hours, unable to get up. A neighbor discovered him this morning on the floor. He reports he's had unsteady gait for several weeks, using his cane. He states that he has diabetic neuropathy, but no new unilateral weakness, numbness in extremities. No  vision change or hearing loss. No slurred speech or facial droop. He reported that he had heartburn earlier this morning. He took 2 "heartburn pills" which alleviated his heartburn. He had nausea and vomited twice. No hematemesis or hematochezia. Patient states that he pulled his muscle in right groin area, and has intermittent pain over that area. No new abdominal pain or diarrhea. Denies symptoms of UTI. No fever or chills. Patient denies chest pain, cough or SOB. Currently patient has confusion, oriented to place, but missed year and president's name.  ED Course: pt was found to have WBC 10, negative troponin, negative urinalysis, creatinine normal, temperature normal, slightly tachycardia, oxygen saturation 100% on room air. MRI of brain is a truncated examination due to patient confusion and inability to lie flat, but showed a small, 1 cm subdural  hematoma over the posterior right convexity, no associated midline shift. Pt is admitted to [facility] as inpt. Neurosurgeon, PA, vincent Costella was consulted.  Hospital Course   Subdural hematoma - This is secondary to mechanical fall, to my eye brain significant for small 1 cm subdural hematoma over the posterior right convexity with no mass effect or midline shift, neuro surgery consultation appreciated, admitted overnight midnight for observation and frequent neuro checks, no change in mental status, no focal deficits, repeat CT head this a.m. with no change in subdural hematoma, discussed with neurosurgery. Per discharge, patient is cleared for discharge, to continue Keppra 500 mg twice daily for 7 days, hold aspirin, and to follow with neurosurgery on 4 weeks  Fall - PT consult appreciated, recommendation for SNF which has been arranged  DM-II:  - cont  home regimen on discharge  HLD: - Continue with Kristen  HTN:  - Acceptable blood pressure during hospital stay off medication  Depression: - January home medications Cymbalta and Depakote   Tobacco abuse: - He was counseled     Discharge Condition:  stable   Follow UP  Follow-up Information    BADGER,MICHAEL C, MD Follow up.   Specialty:  Family Medicine Contact information: [place] [id number] [phone]        Traci Sermon, PA-C Follow up in 4 week(s).   Specialty:  Physician Assistant Contact information: [place] [address] [phone]             Discharge Instructions  and  Discharge Medications     Discharge Instructions    Discharge instructions    Complete by:  As directed    Follow with Primary MD Chesley Noon, MD in 7 days   Get CBC, CMP,  checked  by Primary MD next visit.    Activity: As tolerated with Full fall precautions use walker/cane & assistance as needed   Disposition SNF   Diet: Heart Healthy , carbohydrate  modified diet , with feeding assistance and aspiration precautions.  For Heart failure patients - Check your Weight same time everyday, if you gain over 2 pounds, or you develop in leg swelling, experience more shortness of breath or chest pain, call your Primary MD immediately. Follow Cardiac Low Salt Diet and 1.5 lit/day fluid restriction.   On your next visit with your primary care physician please Get Medicines reviewed and adjusted.   Please request your Prim.MD to go over all Hospital Tests and Procedure/Radiological results at the follow up, please get all Hospital records sent to your Prim MD by signing hospital release before you go home.   If you experience worsening  of your admission symptoms, develop shortness of breath, life threatening emergency, suicidal or homicidal thoughts you must seek medical attention immediately by calling 911 or calling your MD immediately  if symptoms less severe.  You Must read complete instructions/literature along with all the possible adverse reactions/side effects for all the Medicines you take and that have been prescribed to you. Take any new Medicines after you have completely understood and accpet all the possible adverse reactions/side effects.   Do not drive, operating heavy machinery, perform activities at heights, swimming or participation in water activities or provide baby sitting services if your were admitted for syncope or siezures until you have seen by Primary MD or a Neurologist and advised to do so again.  Do not drive when taking Pain medications.    Do not take more than prescribed Pain, Sleep and Anxiety Medications  Special Instructions: If you have smoked or chewed Tobacco  in the last 2 yrs please stop smoking, stop any regular Alcohol  and or any Recreational drug use.  Wear Seat belts while driving.   Please note  You were cared for by a hospitalist during your hospital stay. If you have any questions about your  discharge medications or the care you received while you were in the hospital after you are discharged, you can call the unit and asked to speak with the hospitalist on call if the hospitalist that took care of you is not available. Once you are discharged, your primary care physician will handle any further medical issues. Please note that NO REFILLS for any discharge medications will be authorized once you are discharged, as it is imperative that you return to your primary care physician (or establish a relationship with a primary care physician if you do not have one) for your aftercare needs so that they can reassess your need for medications and monitor your lab values.   Increase activity slowly    Complete by:  As directed      Allergies as of 03/15/2017      Reactions   Lisinopril Other (See Comments)   Caused upper arm and shoulder weakness   Losartan Other (See Comments)   Caused upper arm and shoulder weakness      Medication List    STOP taking these medications   aspirin 81 MG tablet   ibuprofen 800 MG tablet Commonly known as:  ADVIL,MOTRIN     TAKE these medications   albuterol 108 (90 Base) MCG/ACT inhaler Commonly known as:  PROVENTIL HFA;VENTOLIN HFA Inhale 2 puffs into the lungs every 6 (six) hours as needed. For cold symptoms What changed:  reasons to take this  additional instructions   cholecalciferol 1000 units tablet Commonly known as:  VITAMIN D Take 1,000 Units by mouth daily.   divalproex 500 MG DR tablet Commonly known as:  DEPAKOTE Take 2 tablet of 500 mg and 1 tablet of 250 mg, total of 1250 mg every night.   divalproex 250 MG 24 hr tablet Commonly known as:  DEPAKOTE ER Take 2 tablet of 500 mg and 1 tablet of 250 mg, total of 1250 mg every night.   DULoxetine 30 MG capsule Commonly known as:  CYMBALTA Take 1 capsule (30 mg total) by mouth at bedtime.   ferrous sulfate 325 (65 FE) MG tablet Take 1 tablet (325 mg total) by mouth daily with  breakfast.   folic acid 630 MCG tablet Commonly known as:  FOLVITE Take 800 mcg by mouth daily.  gabapentin 600 MG tablet Commonly known as:  NEURONTIN Take 600 mg by mouth 2 (two) times daily.   Insulin Glargine 300 UNIT/ML Sopn Commonly known as:  TOUJEO SOLOSTAR Inject 80 Units into the skin every morning. And pen needles 3/day. What changed:  how much to take   Insulin Pen Needle 31G X 8 MM Misc Use to inject insulin 3 times per day.   levETIRAcetam 500 MG tablet Commonly known as:  KEPPRA Take 1 tablet (500 mg total) by mouth 2 (two) times daily. Please take for 7 days then stop   metFORMIN 1000 MG tablet Commonly known as:  GLUCOPHAGE Take 1 tablet (1,000 mg total) by mouth 2 (two) times daily with a meal.   omega-3 acid ethyl esters 1 g capsule Commonly known as:  LOVAZA Take 2 capsules (2 g total) by mouth 2 (two) times daily.   rosuvastatin 5 MG tablet Commonly known as:  CRESTOR Take 1 tablet (5 mg total) by mouth daily.   senna-docusate 8.6-50 MG tablet Commonly known as:  Senokot-S Take 1 tablet by mouth at bedtime as needed for mild constipation.         Diet and Activity recommendation: See Discharge Instructions above   Consults obtained -  neurosurgery  Major procedures and Radiology Reports - PLEASE review detailed and final reports for all details, in brief -      Dg Forearm Left  Result Date: 03/14/2017 CLINICAL DATA:  Status post fall at home, with left forearm pain. Initial encounter. EXAM: LEFT FOREARM - 2 VIEW COMPARISON:  None. FINDINGS: There is no evidence of fracture or dislocation. The radius and ulna appear intact. The elbow joint is incompletely assessed, but appears grossly unremarkable. The carpal rows appear grossly intact, and demonstrate normal alignment. A peripheral IV catheter is noted overlying the mid forearm. There is medial soft tissue swelling at the elbow. IMPRESSION: No evidence of fracture or dislocation.  Electronically Signed   By: Garald Balding M.D.   On: 03/14/2017 21:31   Ct Head Wo Contrast  Result Date: 03/15/2017 CLINICAL DATA:  Follow-up exam for subdural hemorrhage. EXAM: CT HEAD WITHOUT CONTRAST TECHNIQUE: Contiguous axial images were obtained from the base of the skull through the vertex without intravenous contrast. COMPARISON:  Prior MRI from 03/14/2017. FINDINGS: Brain: Previously identified subdural hemorrhage overlying the right parietal convexity is grossly stable measuring 9 mm in maximal thickness. No significant mass effect. No evidence for interval expansion. No other acute intracranial hemorrhage. No evidence for acute large vessel territory infarct. No midline shift or hydrocephalus. No extra-axial fluid collection. Stable atrophy with chronic small vessel ischemic disease. Vascular: No hyperdense vessel. Skull: Scalp soft tissues demonstrate no acute abnormality. Calvarium intact. Sinuses/Orbits: Globes and oval soft tissues within normal limits. Scattered mucosal thickening within the ethmoidal air cells. Small retention cysts noted within the right maxillary sinus. Visualized paranasal sinuses are otherwise clear. No mastoid effusion. The IMPRESSION: 1. No significant interval change in small subdural hematoma overlying the right parietal convexity, measuring up to 9 mm on current exam. No significant mass effect. 2. No other new acute intracranial process. 3. Generalized cerebral atrophy with moderate chronic microvascular ischemic disease. Electronically Signed   By: Jeannine Boga M.D.   On: 03/15/2017 05:35   Mr Brain Wo Contrast  Result Date: 03/14/2017 CLINICAL DATA:  Found down status post fall EXAM: MRI HEAD WITHOUT CONTRAST TECHNIQUE: Multiplanar, multiecho pulse sequences of the brain and surrounding structures were obtained without intravenous contrast. COMPARISON:  Head CT 10/12/2016 FINDINGS: The examination had to be discontinued prior to completion due to  patient inability to lie on his back and follow the technologist's instructions. Sagittal T1 weighted imaging and diffusion-weighted imaging with corresponding ADC maps were obtained. There is a 1 cm posterior right convexity subdural hematoma. The diffusion-weighted images are degraded by motion, but there is no visible evidence of ischemia. IMPRESSION: 1. Truncated examination due to patient confusion and inability to lie flat. 2. Small, 1 cm subdural hematoma over the posterior right convexity. No associated midline shift. Critical Value/emergent results were called by telephone at the time of interpretation on 03/14/2017 at 7:02 pm to Dr. Orlie Dakin , who verbally acknowledged these results. Electronically Signed   By: Ulyses Jarred M.D.   On: 03/14/2017 19:06   Dg Shoulder Left  Result Date: 03/14/2017 CLINICAL DATA:  Status post fall at home, with left arm and shoulder pain. Initial encounter. EXAM: LEFT SHOULDER - 2+ VIEW COMPARISON:  None. FINDINGS: There is no evidence of fracture or dislocation. The left humeral head is seated within the glenoid fossa. Mild degenerative change is noted at the left acromioclavicular joint. No significant soft tissue abnormalities are seen. The visualized portions of the left lung are clear. IMPRESSION: No evidence of fracture or dislocation. Electronically Signed   By: Garald Balding M.D.   On: 03/14/2017 21:29   Dg Humerus Left  Result Date: 03/14/2017 CLINICAL DATA:  Status post fall at home, with left arm and shoulder pain. Initial encounter. EXAM: LEFT HUMERUS - 2+ VIEW COMPARISON:  None. FINDINGS: There is no evidence of fracture or dislocation. The left humerus appears intact. The elbow joint is grossly unremarkable, though incompletely assessed. The left humeral head remains seated at the glenoid fossa. No significant soft tissue abnormalities are characterized on radiograph. IMPRESSION: No evidence of fracture or dislocation. Electronically Signed   By:  Garald Balding M.D.   On: 03/14/2017 21:30    Micro Results   Recent Results (from the past 240 hour(s))  MRSA PCR Screening     Status: None   Collection Time: 03/14/17 10:44 PM  Result Value Ref Range Status   MRSA by PCR NEGATIVE NEGATIVE Final    Comment:        The GeneXpert MRSA Assay (FDA approved for NASAL specimens only), is one component of a comprehensive MRSA colonization surveillance program. It is not intended to diagnose MRSA infection nor to guide or monitor treatment for MRSA infections.        Today   Subjective:   Luna Glasgow today has no headache,no chest or abdominal pain,no new weakness tingling or numbness, feels much better wants to go home today.   Objective:   Blood pressure (!) 109/56, pulse 84, temperature 98.5 F (36.9 C), temperature source Oral, resp. rate (!) 5, height 6\' 3"  (1.905 m), weight 104 kg (229 lb 4.5 oz), SpO2 96 %.   Intake/Output Summary (Last 24 hours) at 03/15/17 1321 Last data filed at 03/15/17 0903  Gross per 24 hour  Intake              623 ml  Output             1200 ml  Net             -577 ml    Exam Awake Alert, Oriented x 3, No new F.N deficits, Normal affect [place].AT,PERRAL Supple Neck,No JVD, No cervical lymphadenopathy appriciated.  Symmetrical Chest wall movement, Good air  movement bilaterally, CTAB RRR,No Gallops,Rubs or new Murmurs, No Parasternal Heave +ve B.Sounds, Abd Soft, Non tender, No organomegaly appriciated, No rebound -guarding or rigidity. No Cyanosis, Clubbing or edema, No new Rash or bruise  Data Review   CBC w Diff: Lab Results  Component Value Date   WBC 7.1 03/15/2017   HGB 13.4 03/15/2017   HCT 37.5 (L) 03/15/2017   PLT 148 (L) 03/15/2017   LYMPHOPCT 13 03/14/2017   BANDSPCT 0 02/01/2009   MONOPCT 5 03/14/2017   EOSPCT 1 03/14/2017   BASOPCT 0 03/14/2017    CMP: Lab Results  Component Value Date   NA 131 (L) 03/15/2017   K 3.8 03/15/2017   CL 97 (L) 03/15/2017   CO2 22  03/15/2017   BUN 8 03/15/2017   CREATININE 0.81 03/15/2017   CREATININE 1.25 08/03/2013   PROT 7.7 03/14/2017   ALBUMIN 4.4 03/14/2017   BILITOT 0.9 03/14/2017   ALKPHOS 55 03/14/2017   AST 20 03/14/2017   ALT 12 (L) 03/14/2017  .   Total Time in preparing paper work, data evaluation and todays exam - 35 minutes  Glynna Failla M.D on 03/15/2017 at 1:21 PM  [facility]   Office  [phone]

## 2017-03-15 NOTE — Progress Notes (Signed)
Spoke w/ pt today about his home diabetes regimen.  Verified home meds:   Toujeo 90 units daily Metformin 1000 mg BID Novolog 2 units for CBGs >300 mg/dl  Sees Dr. Loanne Drilling for DM management.  Last saw Dr. Loanne Drilling on 03/06/17.  At that visit, Toujeo insulin was increased to 90 units daily.  Current A1c down to 11.2%.  A1c was 13.1% back on 12/24/16.  Stated he did not have any questions for me regarding his diabetes management.    --Will follow patient during hospitalization--  Wyn Quaker RN, MSN, CDE Diabetes Coordinator Inpatient Glycemic Control Team Team Pager: [phone] (8a-5p)

## 2017-03-15 NOTE — NC FL2 (Signed)
[place] LEVEL OF CARE SCREENING TOOL     IDENTIFICATION  Patient Name: Caleb Joseph Birthdate: 1956-02-05 Sex: male Admission Date (Current Location): 03/14/2017  [facility] and [place] Number:  Herbalist and Address:  [facility], [place] [address] [place], [place] [address]      Provider Number: [id number]  Attending Physician Name and Address:  Albertine Patricia, MD  Relative Name and Phone Number:       Current Level of Care: Hospital Recommended Level of Care: [place] Prior Approval Number:    Date Approved/Denied:   PASRR Number: Manual Review  Discharge Plan: SNF    Current Diagnoses: Patient Active Problem List   Diagnosis Date Noted  . Fall 03/14/2017  . Tobacco abuse 03/14/2017  . SDH (subdural hematoma) ([place]) 03/14/2017  . Acute encephalopathy 03/14/2017  . GERD (gastroesophageal reflux disease) 03/14/2017  . Acute metabolic encephalopathy 31/49/7026  . Altered mental status, unspecified 10/12/2016  . Encephalopathy 10/12/2016  . DKA (diabetic ketoacidoses) ([place]) 09/25/2015  . Type 2 diabetes mellitus ([place]) 09/25/2015  . Hyponatremia 09/25/2015  . Lactic acidosis 09/25/2015  . Dysplastic nevus of skin 03/17/2013  . Hx of noncompliance with medical treatment, presenting hazards to health 12/16/2012  . ULNAR NEUROPATHY, RIGHT 06/02/2010  . Other specified disorder of male genital organs(608.89) 11/22/2009  . ELECTROCARDIOGRAM, ABNORMAL 11/22/2009  . PVD 07/19/2009  . DYSPNEA 07/19/2009  . 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 ANEMIA, VITAMIN B12 DEFICIENCY 07/17/2008  . DENTAL CARIES 07/07/2008  . Depression 05/11/2008  . Diabetic neuropathy ([place]) 10/02/2007  . Diabetes mellitus without complication ([place]) 37/85/8850  . OLIGOSPERMIA 10/02/2007  . INGUINAL PAIN, RIGHT 10/02/2007  . Dyslipidemia 08/26/2007  . Essential hypertension 08/26/2007    Orientation RESPIRATION BLADDER  Height & Weight     Self, Time, Situation, Place  Normal Continent Weight: 229 lb 4.5 oz (104 kg) Height:  6\' 3"  (190.5 cm)  BEHAVIORAL SYMPTOMS/MOOD NEUROLOGICAL BOWEL NUTRITION STATUS   (None)  (None) Continent Diet (Carb modified)  AMBULATORY STATUS COMMUNICATION OF NEEDS Skin   Limited Assist Verbally Skin abrasions, Bruising                       Personal Care Assistance Level of Assistance  Bathing, Feeding, Dressing Bathing Assistance: Limited assistance Feeding assistance: Limited assistance Dressing Assistance: Limited assistance     Functional Limitations Info  Sight, Hearing, Speech Sight Info: Adequate Hearing Info: Adequate Speech Info: Adequate    SPECIAL CARE FACTORS FREQUENCY  PT (By licensed PT), Blood pressure, OT (By licensed OT)     PT Frequency: 5 x week OT Frequency: 5 x week            Contractures Contractures Info: Not present    Additional Factors Info  Code Status, Allergies, Psychotropic Code Status Info: Full Allergies Info: Lisinopril, Losartan Psychotropic Info: Depression         Current Medications (03/15/2017):  This is the current hospital active medication list Current Facility-Administered Medications  Medication Dose Route Frequency Provider Last Rate Last Dose  . 0.9 %  sodium chloride infusion   Intravenous Continuous Ivor Costa, MD 75 mL/hr at 03/15/17 1258    . acetaminophen (TYLENOL) tablet 650 mg  650 mg Oral Q6H PRN Ivor Costa, MD       Or  . acetaminophen (TYLENOL) suppository 650 mg  650 mg Rectal Q6H PRN Ivor Costa, MD      .  cholecalciferol (VITAMIN D) tablet 1,000 Units  1,000 Units Oral Daily Ivor Costa, MD   1,000 Units at 03/15/17 1004  . divalproex (DEPAKOTE ER) 24 hr tablet 1,250 mg  1,250 mg Oral QHS Ivor Costa, MD   1,250 mg at 03/15/17 0042  . DULoxetine (CYMBALTA) DR capsule 30 mg  30 mg Oral QHS Ivor Costa, MD   30 mg at 03/15/17 0043  . ferrous sulfate tablet 325 mg  325 mg Oral Q breakfast Ivor Costa,  MD   325 mg at 03/15/17 1003  . folic acid (FOLVITE) tablet 1 mg  1 mg Oral Daily Ivor Costa, MD   1 mg at 03/15/17 1003  . gabapentin (NEURONTIN) capsule 600 mg  600 mg Oral BID Ivor Costa, MD   600 mg at 03/15/17 1003  . hydrALAZINE (APRESOLINE) injection 5 mg  5 mg Intravenous Q2H PRN Ivor Costa, MD      . insulin aspart (novoLOG) injection 0-9 Units  0-9 Units Subcutaneous TID WC Ivor Costa, MD   1 Units at 03/15/17 1004  . insulin glargine (LANTUS) injection 50 Units  50 Units Subcutaneous Daily Ivor Costa, MD   50 Units at 03/15/17 1004  . levETIRAcetam (KEPPRA) 500 mg in sodium chloride 0.9 % 100 mL IVPB  500 mg Intravenous Q12H Ivor Costa, MD   Stopped at 03/15/17 1020  . omega-3 acid ethyl esters (LOVAZA) capsule 2 g  2 g Oral BID Ivor Costa, MD   2 g at 03/15/17 1003  . ondansetron (ZOFRAN) injection 4 mg  4 mg Intravenous Q8H PRN Ivor Costa, MD      . oxyCODONE-acetaminophen (PERCOCET/ROXICET) 5-325 MG per tablet 1 tablet  1 tablet Oral Q4H PRN Ivor Costa, MD      . pantoprazole (PROTONIX) EC tablet 40 mg  40 mg Oral Q1200 Ivor Costa, MD      . rosuvastatin (CRESTOR) tablet 5 mg  5 mg Oral QHS Ivor Costa, MD   5 mg at 03/15/17 0041  . senna-docusate (Senokot-S) tablet 1 tablet  1 tablet Oral QHS PRN Ivor Costa, MD      . sodium chloride flush (NS) 0.9 % injection 3 mL  3 mL Intravenous Q12H Ivor Costa, MD   3 mL at 03/14/17 2311  . zolpidem (AMBIEN) tablet 5 mg  5 mg Oral QHS PRN Ivor Costa, MD         Discharge Medications: Please see discharge summary for a list of discharge medications.  Relevant Imaging Results:  Relevant Lab Results:   Additional Information SS#: [id number]  Candie Chroman, LCSW

## 2017-03-15 NOTE — Discharge Instructions (Signed)
Follow with Primary MD Chesley Noon, MD in 7 days   Get CBC, CMP,  checked  by Primary MD next visit.    Activity: As tolerated with Full fall precautions use walker/cane & assistance as needed   Disposition SNF   Diet: Heart Healthy , carbohydrate modified diet , with feeding assistance and aspiration precautions.  For Heart failure patients - Check your Weight same time everyday, if you gain over 2 pounds, or you develop in leg swelling, experience more shortness of breath or chest pain, call your Primary MD immediately. Follow Cardiac Low Salt Diet and 1.5 lit/day fluid restriction.   On your next visit with your primary care physician please Get Medicines reviewed and adjusted.   Please request your Prim.MD to go over all Hospital Tests and Procedure/Radiological results at the follow up, please get all Hospital records sent to your Prim MD by signing hospital release before you go home.   If you experience worsening of your admission symptoms, develop shortness of breath, life threatening emergency, suicidal or homicidal thoughts you must seek medical attention immediately by calling 911 or calling your MD immediately  if symptoms less severe.  You Must read complete instructions/literature along with all the possible adverse reactions/side effects for all the Medicines you take and that have been prescribed to you. Take any new Medicines after you have completely understood and accpet all the possible adverse reactions/side effects.   Do not drive, operating heavy machinery, perform activities at heights, swimming or participation in water activities or provide baby sitting services if your were admitted for syncope or siezures until you have seen by Primary MD or a Neurologist and advised to do so again.  Do not drive when taking Pain medications.    Do not take more than prescribed Pain, Sleep and Anxiety Medications  Special Instructions: If you have smoked or chewed  Tobacco  in the last 2 yrs please stop smoking, stop any regular Alcohol  and or any Recreational drug use.  Wear Seat belts while driving.   Please note  You were cared for by a hospitalist during your hospital stay. If you have any questions about your discharge medications or the care you received while you were in the hospital after you are discharged, you can call the unit and asked to speak with the hospitalist on call if the hospitalist that took care of you is not available. Once you are discharged, your primary care physician will handle any further medical issues. Please note that NO REFILLS for any discharge medications will be authorized once you are discharged, as it is imperative that you return to your primary care physician (or establish a relationship with a primary care physician if you do not have one) for your aftercare needs so that they can reassess your need for medications and monitor your lab values.

## 2017-03-15 NOTE — Clinical Social Work Placement (Signed)
   CLINICAL SOCIAL WORK PLACEMENT  NOTE  Date:  03/15/2017  Patient Details  Name: Caleb Joseph MRN: [id number] Date of Birth: [birth year]  Clinical Social Work is seeking post-discharge placement for this patient at the [place] level of care (*CSW will initial, date and re-position this form in  chart as items are completed):  Yes   Patient/family provided with [facility] Work Department's list of facilities offering this level of care within the geographic area requested by the patient (or if unable, by the patient's family).  Yes   Patient/family informed of their freedom to choose among providers that offer the needed level of care, that participate in Medicare, Medicaid or managed care program needed by the patient, have an available bed and are willing to accept the patient.  Yes   Patient/family informed of [facility]'s ownership interest in [facility] and [facility], as well as of the fact that they are under no obligation to receive care at these facilities.  PASRR submitted to EDS on 03/15/17     PASRR number received on       Existing PASRR number confirmed on       FL2 transmitted to all facilities in geographic area requested by pt/family on 03/15/17     FL2 transmitted to all facilities within larger geographic area on       Patient informed that his/her managed care company has contracts with or will negotiate with certain facilities, including the following:            Patient/family informed of bed offers received.  Patient chooses bed at       Physician recommends and patient chooses bed at      Patient to be transferred to   on  .  Patient to be transferred to facility by       Patient family notified on   of transfer.  Name of family member notified:        PHYSICIAN Please sign FL2     Additional Comment:    _______________________________________________ Candie Chroman, LCSW 03/15/2017, 1:11 PM

## 2017-03-15 NOTE — Evaluation (Signed)
Physical Therapy Evaluation Patient Details Name: Caleb Joseph MRN: [id number] DOB: 08-05-1956 Today's Date: 03/15/2017   History of Present Illness  Pt is a 61 y/o admitted with AMS and small SDH after a fall at home. PMH: DM, peripheral neuropathy, HTN, R toe amputation, depression, tremor.  Clinical Impression  Pt presented supine in bed with HOB elevated, awake and willing to participate in therapy session. Prior to admission, pt reported that he was mod I with functional mobility with use of SPC to ambulate. Pt currently requires min guard for bed mobility, min A x2 for safety with transfers and mod A to ambulate a short distance (15') with RW. Pt also with cognitive deficits throughout (see below for details). All VSS throughout. Pt would continue to benefit from skilled physical therapy services at this time while admitted and after d/c to address the below listed limitations in order to improve overall safety and independence with functional mobility.     Follow Up Recommendations SNF;Supervision/Assistance - 24 hour    Equipment Recommendations  None recommended by PT (defer to next venue)    Recommendations for Other Services       Precautions / Restrictions Precautions Precautions: Fall Restrictions Weight Bearing Restrictions: No      Mobility  Bed Mobility Overal bed mobility: Needs Assistance Bed Mobility: Supine to Sit     Supine to sit: Min guard;HOB elevated     General bed mobility comments: increased time, use of rail  Transfers Overall transfer level: Needs assistance Equipment used: Rolling walker (2 wheeled) Transfers: Sit to/from Stand Sit to Stand: Min assist;+2 safety/equipment         General transfer comment: increased time, VC'ing for bilateral UE placement and min A x2 for safety and stability with rise from bed  Ambulation/Gait Ambulation/Gait assistance: Mod assist Ambulation Distance (Feet): 15 Feet Assistive device: Rolling walker  (2 wheeled) Gait Pattern/deviations: Step-to pattern;Step-through pattern;Decreased step length - right;Decreased step length - left;Decreased stride length;Shuffle;Wide base of support Gait velocity: decreased Gait velocity interpretation: Below normal speed for age/gender General Gait Details: pt with modest instability with ambulation and requiring constant assistance with RW for safety and movement.   Stairs            Wheelchair Mobility    Modified Rankin (Stroke Patients Only)       Balance Overall balance assessment: Needs assistance Sitting-balance support: Feet supported Sitting balance-Leahy Scale: Fair Sitting balance - Comments: pt sat EOB with min guard   Standing balance support: During functional activity;No upper extremity supported Standing balance-Leahy Scale: Poor Standing balance comment: posterior lean with attempt to use urinal at bedside                             Pertinent Vitals/Pain Pain Assessment: No/denies pain    Home Living Family/patient expects to be discharged to:: Private residence Living Arrangements: Alone Available Help at Discharge: Friend(s);Available PRN/intermittently Type of Home: Mobile home Home Access: Stairs to enter Entrance Stairs-Rails: Right;Left Entrance Stairs-Number of Steps: 7 Home Layout: One level Home Equipment: Cane - single point      Prior Function Level of Independence: Independent with assistive device(s)         Comments: pt uses a SPC to ambulate. he continues to drive.     Hand Dominance   Dominant Hand: Right    Extremity/Trunk Assessment   Upper Extremity Assessment Upper Extremity Assessment: Defer to OT evaluation  Lower Extremity Assessment Lower Extremity Assessment: Generalized weakness       Communication   Communication: No difficulties  Cognition Arousal/Alertness: Awake/alert (sleepy) Behavior During Therapy: Flat affect Overall Cognitive Status:  Impaired/Different from baseline Area of Impairment: Orientation;Memory;Following commands;Safety/judgement;Problem solving                 Orientation Level: Disoriented to;Time   Memory: Decreased short-term memory Following Commands: Follows one step commands with increased time Safety/Judgement: Decreased awareness of safety;Decreased awareness of deficits   Problem Solving: Slow processing;Decreased initiation;Difficulty sequencing;Requires verbal cues;Requires tactile cues        General Comments      Exercises     Assessment/Plan    PT Assessment Patient needs continued PT services  PT Problem List Decreased balance;Decreased mobility;Decreased coordination;Decreased cognition;Decreased knowledge of use of DME;Decreased safety awareness       PT Treatment Interventions DME instruction;Gait training;Stair training;Functional mobility training;Therapeutic activities;Therapeutic exercise;Balance training;Neuromuscular re-education;Patient/family education;Cognitive remediation    PT Goals (Current goals can be found in the Care Plan section)  Acute Rehab PT Goals Patient Stated Goal: to return home to his cat PT Goal Formulation: With patient Time For Goal Achievement: 03/29/17 Potential to Achieve Goals: Good    Frequency Min 3X/week   Barriers to discharge Decreased caregiver support      Co-evaluation PT/OT/SLP Co-Evaluation/Treatment: Yes Reason for Co-Treatment: For patient/therapist safety;To address functional/ADL transfers PT goals addressed during session: Mobility/safety with mobility;Balance;Proper use of DME OT goals addressed during session: ADL's and self-care       End of Session Equipment Utilized During Treatment: Gait belt Activity Tolerance: Patient tolerated treatment well Patient left: in chair;with call bell/phone within reach;with chair alarm set Nurse Communication: Mobility status PT Visit Diagnosis: Unsteadiness on feet  (R26.81);Other abnormalities of gait and mobility (R26.89)    Time: 9629-5284 PT Time Calculation (min) (ACUTE ONLY): 29 min   Charges:   PT Evaluation $PT Eval Moderate Complexity: 1 Procedure     PT G Codes:        Sherie Don, PT, DPT [place] 03/15/2017, 10:02 AM

## 2017-03-15 NOTE — Clinical Social Work Note (Signed)
Clinical Social Work Assessment  Patient Details  Name: Caleb Joseph MRN: [id number] Date of Birth: 18-Apr-1956  Date of referral:  03/15/17               Reason for consult:  Facility Placement, Discharge Planning                Permission sought to share information with:  Chartered certified accountant granted to share information::  Yes, Verbal Permission Granted  Name::        Agency::  SNF's  Relationship::     Contact Information:     Housing/Transportation Living arrangements for the past 2 months:  Mobile Home Source of Information:  Patient, Medical Team Patient Interpreter Needed:  None Criminal Activity/Legal Involvement Pertinent to Current Situation/Hospitalization:  No - Comment as needed Significant Relationships:  Siblings Lives with:  Self Do you feel safe going back to the place where you live?  Yes Need for family participation in patient care:  Yes (Comment)  Care giving concerns:  PT recommending SNF once medically stable for discharge.   Social Worker assessment / plan:  CSW met with patient. No supports at bedside. MD determined that patient is fully oriented. CSW introduced role and explained that PT recommendations would be discussed. Patient agreeable to SNF placement. [facility] is first preference. Admissions coordinator notified. PASARR is under manual review due to depression diagnosis. Patient cannot discharge to SNF until PASARR obtained. No further concerns. CSW encouraged patient to contact CSW as needed. CSW will continue to follow patient for support and facilitate discharge to SNF today if possible.  Employment status:  Unemployed Nurse, adult PT Recommendations:  [place] / Referral to community resources:  [place]  Patient/Family's Response to care:  Patient agreeable to SNF placement. Patient's brothers supportive and involved in patient's care. Patient  appreciated social work intervention.  Patient/Family's Understanding of and Emotional Response to Diagnosis, Current Treatment, and Prognosis:  Patient appears to have a good idea of the reason for admission. Patient appears pleased with hospital care.  Emotional Assessment Appearance:  Appears stated age Attitude/Demeanor/Rapport:  Other (Pleasant) Affect (typically observed):  Accepting, Appropriate, Calm, Pleasant Orientation:  Oriented to Self, Oriented to Place, Oriented to  Time, Oriented to Situation Alcohol / Substance use:  Tobacco Use Psych involvement (Current and /or in the community):  No (Comment)  Discharge Needs  Concerns to be addressed:  Care Coordination Readmission within the last 30 days:  No Current discharge risk:  Dependent with Mobility, Lives alone Barriers to Discharge:  [place] Tour manager), [place], LCSW 03/15/2017, 1:08 PM

## 2017-03-15 NOTE — Care Management Note (Signed)
Case Management Note  Patient Details  Name: Caleb Joseph MRN: [id number] Date of Birth: 10/31/1956  Subjective/Objective:  From home alone, presents with SDH from fall. Stable for dc today, plan is for SNF, CSW following.                  Action/Plan:   Expected Discharge Date:  03/15/17               Expected Discharge Plan:  Skilled Nursing Facility  In-House Referral:  Clinical Social Work  Discharge planning Services  CM Consult  Post Acute Care Choice:    Choice offered to:     DME Arranged:    DME Agency:     HH Arranged:    [place] Agency:     Status of Service:  Completed, signed off  If discussed at [employer], dates discussed:    Additional Comments:  Zenon Mayo, RN 03/15/2017, 3:22 PM

## 2017-03-15 NOTE — Care Management Obs Status (Signed)
[place] NOTIFICATION   Patient Details  Name: Caleb Joseph MRN: [id number] Date of Birth: May 05, 1956   Medicare Observation Status Notification Given:  Yes    Zenon Mayo, RN 03/15/2017, 3:07 PM

## 2017-03-16 DIAGNOSIS — I10 Essential (primary) hypertension: Secondary | ICD-10-CM | POA: Diagnosis not present

## 2017-03-16 DIAGNOSIS — E119 Type 2 diabetes mellitus without complications: Secondary | ICD-10-CM | POA: Diagnosis not present

## 2017-03-16 DIAGNOSIS — I6200 Nontraumatic subdural hemorrhage, unspecified: Secondary | ICD-10-CM | POA: Diagnosis not present

## 2017-03-16 LAB — GLUCOSE, CAPILLARY
GLUCOSE-CAPILLARY: 69 mg/dL (ref 65–99)
Glucose-Capillary: 136 mg/dL — ABNORMAL HIGH (ref 65–99)
Glucose-Capillary: 240 mg/dL — ABNORMAL HIGH (ref 65–99)
Glucose-Capillary: 182 mg/dL — ABNORMAL HIGH (ref 65–99)
Glucose-Capillary: 136 mg/dL — ABNORMAL HIGH (ref 65–99)

## 2017-03-16 LAB — HIV ANTIBODY (ROUTINE TESTING W REFLEX): HIV Screen 4th Generation wRfx: NONREACTIVE

## 2017-03-16 MED ORDER — LEVETIRACETAM 500 MG PO TABS
500.0000 mg | ORAL_TABLET | Freq: Two times a day (BID) | ORAL | Status: DC
  Administered 2017-03-16 – 2017-03-18 (×5): 500 mg via ORAL
  Filled 2017-03-16 (×5): qty 1

## 2017-03-16 NOTE — Progress Notes (Signed)
[facility] PROGRESS NOTE  Caleb Joseph ZOX:[id number] DOB: [birth year] DOA: 03/14/2017 PCP: Chesley Noon, MD  Brief summary  61 y.o.malewith medical history significant of hypertension, hyperlipidemia, diabetes mellitus, depression, tobacco abuse, resting tremor, who presents with fall, head injury and altered mental status.  Pt states that he woke up in the early morning, tried to go to bathroom, but tripped his steps and fell. He is not sure if he had LOC. He states that he injured his head and left arm. He has HA and pain in left shoulder, upper and forearm. He lay on the floor forhe thinks several hours, unable to get up. Aneighbor discovered him this morning on the floor. He reports he's had unsteady gait for several weeks, using his cane. He states that he has diabetic neuropathy, but no new unilateral weakness, numbness in extremities. No vision change or hearing loss. No slurred speech or facial droop. He reported that he had heartburn earlier this morning. He took 2 "heartburn pills" which alleviated his heartburn. He had nausea and vomited twice. Nohematemesis or hematochezia. Patient states that he pulled his muscle in right groin area, and has intermittent pain over that area. No new abdominal pain or diarrhea. Denies symptoms of UTI. No fever or chills. Patient denies chest pain, cough or SOB.  ED Course:pt was found to have WBC 10, negative troponin, negative urinalysis, creatinine normal, temperature normal, slightly tachycardia, oxygen saturation 100% on room air. MRI of brain is a truncated examination due to patient confusion and inability to lie flat, but showed a small, 1 cm subdural hematoma over the posterior right convexity, noassociated midline shift. Pt is admitted to [facility] as inpt. Neurosurgeon, PA, vincent Costella was consulted.  Assessment/Plan:  Subdural hematoma - This is secondary to mechanical fall, to my eye brain significant for small 1 cm  subdural hematoma over the posterior right convexity with no mass effect or midline shift, neuro surgery consultation appreciated, admitted overnight midnight for observation and frequent neuro checks, no change in mental status, no focal deficits, repeat CT head this a.m. with no change in subdural hematoma, discussed with neurosurgery. Per discharge, patient is cleared for discharge, to continue Keppra 500 mg twice daily for 7 days, hold aspirin, and to follow with neurosurgery on 4 weeks  Fall - PT consult appreciated, recommendation for SNF which has been arranged  DM-II: - cont  home regimen on discharge  HLD: - Continue with Kristen  HTN:  - Acceptable blood pressure during hospital stay off medication  Depression: - January home medications Cymbalta and Depakote   Tobacco abuse: - He was counseled   Code Status: full Family Communication: d/w patient, rn (indicate person spoken with, relationship, and if by phone, the number) Disposition Plan: pend pasaar   Consultants:  Neurosurgery   Procedures:  none  Antibiotics:  none (indicate start date, and stop date if known)  HPI/Subjective: Alert, oriented   Objective: Vitals:   03/16/17 0109 03/16/17 0430  BP: 132/82 106/63  Pulse: 78 72  Resp: 18 16  Temp: 98.7 F (37.1 C) 98.8 F (37.1 C)    Intake/Output Summary (Last 24 hours) at 03/16/17 0840 Last data filed at 03/16/17 0800  Gross per 24 hour  Intake             2505 ml  Output             2700 ml  Net             -195  ml   Filed Weights   03/14/17 2258  Weight: 104 kg (229 lb 4.5 oz)    Exam:   General:  No distress   Cardiovascular: s1,s2 rrr  Respiratory: CTA BL  Abdomen: soft, nt, nd   Musculoskeletal: no leg edema    Data Reviewed: Basic Metabolic Panel:  Recent Labs Lab 03/14/17 1822 03/14/17 1836 03/15/17 0428  NA 132* 131* 131*  K 4.2 4.3 3.8  CL 99* 98* 97*  CO2 23  --  22  GLUCOSE 201* 196* 154*  BUN  11 11 8   CREATININE 0.87 0.80 0.81  CALCIUM 9.3  --  9.0   Liver Function Tests:  Recent Labs Lab 03/14/17 1822  AST 20  ALT 12*  ALKPHOS 55  BILITOT 0.9  PROT 7.7  ALBUMIN 4.4    Recent Labs Lab 03/14/17 1948  LIPASE 14   No results for input(s): AMMONIA in the last 168 hours. CBC:  Recent Labs Lab 03/14/17 1822 03/14/17 1836 03/15/17 0428  WBC 10.0  --  7.1  NEUTROABS 8.2*  --   --   HGB 15.2 15.3 13.4  HCT 42.0 45.0 37.5*  MCV 85.4  --  85.2  PLT 164  --  148*   Cardiac Enzymes:  Recent Labs Lab 03/14/17 1822  CKTOTAL 237   BNP (last 3 results) No results for input(s): BNP in the last 8760 hours.  ProBNP (last 3 results) No results for input(s): PROBNP in the last 8760 hours.  CBG:  Recent Labs Lab 03/15/17 0852 03/15/17 1130 03/15/17 1747 03/15/17 1757 03/15/17 2133  GLUCAP 131* 135* 124* 122* 144*    Recent Results (from the past 240 hour(s))  MRSA PCR Screening     Status: None   Collection Time: 03/14/17 10:44 PM  Result Value Ref Range Status   MRSA by PCR NEGATIVE NEGATIVE Final    Comment:        The GeneXpert MRSA Assay (FDA approved for NASAL specimens only), is one component of a comprehensive MRSA colonization surveillance program. It is not intended to diagnose MRSA infection nor to guide or monitor treatment for MRSA infections.      Studies: Dg Forearm Left  Result Date: 03/14/2017 CLINICAL DATA:  Status post fall at home, with left forearm pain. Initial encounter. EXAM: LEFT FOREARM - 2 VIEW COMPARISON:  None. FINDINGS: There is no evidence of fracture or dislocation. The radius and ulna appear intact. The elbow joint is incompletely assessed, but appears grossly unremarkable. The carpal rows appear grossly intact, and demonstrate normal alignment. A peripheral IV catheter is noted overlying the mid forearm. There is medial soft tissue swelling at the elbow. IMPRESSION: No evidence of fracture or dislocation.  Electronically Signed   By: Garald Balding M.D.   On: 03/14/2017 21:31   Ct Head Wo Contrast  Result Date: 03/15/2017 CLINICAL DATA:  Follow-up exam for subdural hemorrhage. EXAM: CT HEAD WITHOUT CONTRAST TECHNIQUE: Contiguous axial images were obtained from the base of the skull through the vertex without intravenous contrast. COMPARISON:  Prior MRI from 03/14/2017. FINDINGS: Brain: Previously identified subdural hemorrhage overlying the right parietal convexity is grossly stable measuring 9 mm in maximal thickness. No significant mass effect. No evidence for interval expansion. No other acute intracranial hemorrhage. No evidence for acute large vessel territory infarct. No midline shift or hydrocephalus. No extra-axial fluid collection. Stable atrophy with chronic small vessel ischemic disease. Vascular: No hyperdense vessel. Skull: Scalp soft tissues demonstrate no acute abnormality. Calvarium  intact. Sinuses/Orbits: Globes and oval soft tissues within normal limits. Scattered mucosal thickening within the ethmoidal air cells. Small retention cysts noted within the right maxillary sinus. Visualized paranasal sinuses are otherwise clear. No mastoid effusion. The IMPRESSION: 1. No significant interval change in small subdural hematoma overlying the right parietal convexity, measuring up to 9 mm on current exam. No significant mass effect. 2. No other new acute intracranial process. 3. Generalized cerebral atrophy with moderate chronic microvascular ischemic disease. Electronically Signed   By: Jeannine Boga M.D.   On: 03/15/2017 05:35   Mr Brain Wo Contrast  Result Date: 03/14/2017 CLINICAL DATA:  Found down status post fall EXAM: MRI HEAD WITHOUT CONTRAST TECHNIQUE: Multiplanar, multiecho pulse sequences of the brain and surrounding structures were obtained without intravenous contrast. COMPARISON:  Head CT 10/12/2016 FINDINGS: The examination had to be discontinued prior to completion due to  patient inability to lie on his back and follow the technologist's instructions. Sagittal T1 weighted imaging and diffusion-weighted imaging with corresponding ADC maps were obtained. There is a 1 cm posterior right convexity subdural hematoma. The diffusion-weighted images are degraded by motion, but there is no visible evidence of ischemia. IMPRESSION: 1. Truncated examination due to patient confusion and inability to lie flat. 2. Small, 1 cm subdural hematoma over the posterior right convexity. No associated midline shift. Critical Value/emergent results were called by telephone at the time of interpretation on 03/14/2017 at 7:02 pm to Dr. Orlie Dakin , who verbally acknowledged these results. Electronically Signed   By: Ulyses Jarred M.D.   On: 03/14/2017 19:06   Dg Shoulder Left  Result Date: 03/14/2017 CLINICAL DATA:  Status post fall at home, with left arm and shoulder pain. Initial encounter. EXAM: LEFT SHOULDER - 2+ VIEW COMPARISON:  None. FINDINGS: There is no evidence of fracture or dislocation. The left humeral head is seated within the glenoid fossa. Mild degenerative change is noted at the left acromioclavicular joint. No significant soft tissue abnormalities are seen. The visualized portions of the left lung are clear. IMPRESSION: No evidence of fracture or dislocation. Electronically Signed   By: Garald Balding M.D.   On: 03/14/2017 21:29   Dg Humerus Left  Result Date: 03/14/2017 CLINICAL DATA:  Status post fall at home, with left arm and shoulder pain. Initial encounter. EXAM: LEFT HUMERUS - 2+ VIEW COMPARISON:  None. FINDINGS: There is no evidence of fracture or dislocation. The left humerus appears intact. The elbow joint is grossly unremarkable, though incompletely assessed. The left humeral head remains seated at the glenoid fossa. No significant soft tissue abnormalities are characterized on radiograph. IMPRESSION: No evidence of fracture or dislocation. Electronically Signed   By:  Garald Balding M.D.   On: 03/14/2017 21:30    Scheduled Meds: . cholecalciferol  1,000 Units Oral Daily  . divalproex (DEPAKOTE ER) 24 hr tablet 1,250 mg  1,250 mg Oral QHS  . DULoxetine  30 mg Oral QHS  . ferrous sulfate  325 mg Oral Q breakfast  . folic acid  1 mg Oral Daily  . gabapentin  600 mg Oral BID  . insulin aspart  0-9 Units Subcutaneous TID WC  . insulin glargine  50 Units Subcutaneous Daily  . omega-3 acid ethyl esters  2 g Oral BID  . pantoprazole  40 mg Oral Q1200  . rosuvastatin  5 mg Oral QHS  . sodium chloride flush  3 mL Intravenous Q12H   Continuous Infusions: . sodium chloride 75 mL/hr at 03/16/17 0317  .  levETIRAcetam Stopped (03/15/17 2148)    Principal Problem:   SDH (subdural hematoma) (HCC) Active Problems:   Diabetes mellitus without complication (HCC)   Dyslipidemia   Depression   Essential hypertension   Type 2 diabetes mellitus ([place])   Fall   Tobacco abuse   Acute encephalopathy   GERD (gastroesophageal reflux disease)   Subdural hematoma (HCC)    Time spent: >35 minutes     Kinnie Feil  [facility] Pager [phone]. If 7PM-7AM, please contact night-coverage at [email or web address], password [facility] 03/16/2017, 8:40 AM  LOS: 1 day

## 2017-03-16 NOTE — Clinical Social Work Note (Signed)
Clinical Social Worker continuing to follow patient and family for support and discharge planning needs.  Patient PASARR in manual review at this time.  Likely will not have a PASARR number before Monday.  CSW to facilitate patient discharge once authorization number in place.  Barbette Or, LCSW (Weekend Coverage) [phone]

## 2017-03-16 NOTE — Progress Notes (Signed)
CBG 69, patient having graham crackers and coffee plus breakfast tray. Will recheck after breakfast

## 2017-03-17 DIAGNOSIS — I6200 Nontraumatic subdural hemorrhage, unspecified: Secondary | ICD-10-CM | POA: Diagnosis not present

## 2017-03-17 DIAGNOSIS — I10 Essential (primary) hypertension: Secondary | ICD-10-CM | POA: Diagnosis not present

## 2017-03-17 DIAGNOSIS — E119 Type 2 diabetes mellitus without complications: Secondary | ICD-10-CM | POA: Diagnosis not present

## 2017-03-17 LAB — GLUCOSE, CAPILLARY
GLUCOSE-CAPILLARY: 83 mg/dL (ref 65–99)
GLUCOSE-CAPILLARY: 129 mg/dL — AB (ref 65–99)
Glucose-Capillary: 108 mg/dL — ABNORMAL HIGH (ref 65–99)
Glucose-Capillary: 233 mg/dL — ABNORMAL HIGH (ref 65–99)

## 2017-03-17 NOTE — Progress Notes (Signed)
[facility] PROGRESS NOTE  Caleb Joseph:[id number] DOB: August 15, 1956 DOA: 03/14/2017 PCP: Chesley Noon, MD  Brief summary  61 y.o.malewith medical history significant of hypertension, hyperlipidemia, diabetes mellitus, depression, tobacco abuse, resting tremor, who presents with fall, head injury and altered mental status.  Pt states that he woke up in the early morning, tried to go to bathroom, but tripped his steps and fell. He is not sure if he had LOC. He states that he injured his head and left arm. He has HA and pain in left shoulder, upper and forearm. He lay on the floor forhe thinks several hours, unable to get up. Aneighbor discovered him this morning on the floor. He reports he's had unsteady gait for several weeks, using his cane. He states that he has diabetic neuropathy, but no new unilateral weakness, numbness in extremities. No vision change or hearing loss. No slurred speech or facial droop. He reported that he had heartburn earlier this morning. He took 2 "heartburn pills" which alleviated his heartburn. He had nausea and vomited twice. Nohematemesis or hematochezia. Patient states that he pulled his muscle in right groin area, and has intermittent pain over that area. No new abdominal pain or diarrhea. Denies symptoms of UTI. No fever or chills. Patient denies chest pain, cough or SOB.  ED Course:pt was found to have WBC 10, negative troponin, negative urinalysis, creatinine normal, temperature normal, slightly tachycardia, oxygen saturation 100% on room air. MRI of brain is a truncated examination due to patient confusion and inability to lie flat, but showed a small, 1 cm subdural hematoma over the posterior right convexity, noassociated midline shift. Pt is admitted to [facility] as inpt. Neurosurgeon, PA, vincent Costella was consulted.  Assessment/Plan:  Subdural hematoma - This is secondary to mechanical fall, to my eye brain significant for small 1 cm  subdural hematoma over the posterior right convexity with no mass effect or midline shift, neuro surgery consultation appreciated, admitted overnight midnight for observation and frequent neuro checks, no change in mental status, no focal deficits, repeat CT head this a.m. with no change in subdural hematoma, discussed with neurosurgery. Per discharge, patient is cleared for discharge, to continue Keppra 500 mg twice daily for 7 days, hold aspirin, and to follow with neurosurgery on 4 weeks  Fall - PT consult appreciated, recommendation for SNF which has been arranged  DM-II: - cont  home regimen on discharge  HLD: - Continue with Kristen  HTN:  - Acceptable blood pressure during hospital stay off medication  Depression: - January home medications Cymbalta and Depakote   Tobacco abuse: - He was counseled  Transfer: SDU-> medsurg 4/22. Awaiting pasaar    Code Status: full Family Communication: d/w patient, rn (indicate person spoken with, relationship, and if by phone, the number) Disposition Plan: pend pasaar   Consultants:  Neurosurgery   Procedures:  none  Antibiotics:  none (indicate start date, and stop date if known)  HPI/Subjective: Alert, oriented. No complaints   Objective: Vitals:   03/17/17 0727 03/17/17 0728  BP: (!) 133/114 (!) 131/94  Pulse: 90 87  Resp: (!) 22 20  Temp:  97.7 F (36.5 C)    Intake/Output Summary (Last 24 hours) at 03/17/17 0821 Last data filed at 03/17/17 0815  Gross per 24 hour  Intake              720 ml  Output             2550 ml  Net            -  1830 ml   Filed Weights   03/14/17 2258  Weight: 104 kg (229 lb 4.5 oz)    Exam:   General:  No distress   Cardiovascular: s1,s2 rrr  Respiratory: CTA BL  Abdomen: soft, nt, nd   Musculoskeletal: no leg edema    Data Reviewed: Basic Metabolic Panel:  Recent Labs Lab 03/14/17 1822 03/14/17 1836 03/15/17 0428  NA 132* 131* 131*  K 4.2 4.3 3.8  CL  99* 98* 97*  CO2 23  --  22  GLUCOSE 201* 196* 154*  BUN 11 11 8   CREATININE 0.87 0.80 0.81  CALCIUM 9.3  --  9.0   Liver Function Tests:  Recent Labs Lab 03/14/17 1822  AST 20  ALT 12*  ALKPHOS 55  BILITOT 0.9  PROT 7.7  ALBUMIN 4.4    Recent Labs Lab 03/14/17 1948  LIPASE 14   No results for input(s): AMMONIA in the last 168 hours. CBC:  Recent Labs Lab 03/14/17 1822 03/14/17 1836 03/15/17 0428  WBC 10.0  --  7.1  NEUTROABS 8.2*  --   --   HGB 15.2 15.3 13.4  HCT 42.0 45.0 37.5*  MCV 85.4  --  85.2  PLT 164  --  148*   Cardiac Enzymes:  Recent Labs Lab 03/14/17 1822  CKTOTAL 237   BNP (last 3 results) No results for input(s): BNP in the last 8760 hours.  ProBNP (last 3 results) No results for input(s): PROBNP in the last 8760 hours.  CBG:  Recent Labs Lab 03/16/17 0900 03/16/17 0922 03/16/17 1246 03/16/17 1525 03/16/17 2207  GLUCAP 69 136* 240* 182* 136*    Recent Results (from the past 240 hour(s))  MRSA PCR Screening     Status: None   Collection Time: 03/14/17 10:44 PM  Result Value Ref Range Status   MRSA by PCR NEGATIVE NEGATIVE Final    Comment:        The GeneXpert MRSA Assay (FDA approved for NASAL specimens only), is one component of a comprehensive MRSA colonization surveillance program. It is not intended to diagnose MRSA infection nor to guide or monitor treatment for MRSA infections.      Studies: No results found.  Scheduled Meds: . cholecalciferol  1,000 Units Oral Daily  . divalproex (DEPAKOTE ER) 24 hr tablet 1,250 mg  1,250 mg Oral QHS  . DULoxetine  30 mg Oral QHS  . ferrous sulfate  325 mg Oral Q breakfast  . folic acid  1 mg Oral Daily  . gabapentin  600 mg Oral BID  . insulin aspart  0-9 Units Subcutaneous TID WC  . insulin glargine  50 Units Subcutaneous Daily  . levETIRAcetam  500 mg Oral BID  . omega-3 acid ethyl esters  2 g Oral BID  . pantoprazole  40 mg Oral Q1200  . rosuvastatin  5 mg Oral  QHS  . sodium chloride flush  3 mL Intravenous Q12H   Continuous Infusions:   Principal Problem:   SDH (subdural hematoma) (HCC) Active Problems:   Diabetes mellitus without complication (HCC)   Dyslipidemia   Depression   Essential hypertension   Type 2 diabetes mellitus ([place])   Fall   Tobacco abuse   Acute encephalopathy   GERD (gastroesophageal reflux disease)   Subdural hematoma ([place])    Time spent: >35 minutes     Kinnie Feil  [facility] Pager [phone]. If 7PM-7AM, please contact night-coverage at [email or web address], password [facility] 03/17/2017, 8:21 AM  LOS: 1  day

## 2017-03-17 NOTE — Progress Notes (Signed)
Transferred from 4E to my unit, 5C, Rm 06. VSS.  See assessment. Stable.

## 2017-03-18 DIAGNOSIS — E785 Hyperlipidemia, unspecified: Secondary | ICD-10-CM

## 2017-03-18 DIAGNOSIS — G9340 Encephalopathy, unspecified: Secondary | ICD-10-CM

## 2017-03-18 DIAGNOSIS — E119 Type 2 diabetes mellitus without complications: Secondary | ICD-10-CM | POA: Diagnosis not present

## 2017-03-18 DIAGNOSIS — I6200 Nontraumatic subdural hemorrhage, unspecified: Secondary | ICD-10-CM | POA: Diagnosis not present

## 2017-03-18 LAB — GLUCOSE, CAPILLARY
GLUCOSE-CAPILLARY: 87 mg/dL (ref 65–99)
Glucose-Capillary: 144 mg/dL — ABNORMAL HIGH (ref 65–99)

## 2017-03-18 NOTE — Clinical Social Work Note (Signed)
CSW facilitated patient discharge including contacting facility to confirm patient discharge plans. Patient declined having CSW call family. He said his friend had his cell phone and he would have him call his brother. Clinical information faxed to facility and family agreeable with plan. CSW arranged ambulance transport via [facility] to [facility] at 2:00 pm. RN to call report prior to discharge [phone] Room [phone]).  CSW will sign off for now as social work intervention is no longer needed. Please consult [place] again if new needs arise.  Dayton Scrape, [place]

## 2017-03-18 NOTE — Discharge Summary (Signed)
Physician Discharge Summary  Caleb Joseph UTM:[id number] DOB: February 14, 1956 DOA: 03/14/2017  PCP: Chesley Noon, MD  Admit date: 03/14/2017 Discharge date: 03/18/2017  Admitted From: home Disposition:  SNF  Recommendations for Outpatient Follow-up:  1. Follow up with PCP in 1-2 weeks 2. Follow up with Ferne Reus PA Neurosurgery in 4 weeks  Discharge Condition: stable CODE STATUS: Full code Diet recommendation: regular  HPI: Per Dr. Blaine Hamper, Caleb Joseph is a 61 y.o. male with medical history significant of hypertension, hyperlipidemia, diabetes mellitus, depression, tobacco abuse, resting tremor, who presents with fall, head injury and altered mental status. Pt states that he woke up in the early morning, tried to go to bathroom, but tripped his steps and fell. He is not sure if he had LOC. He states that he injured his head and left arm. He has HA and pain in left shoulder, upper and forearm. He lay on the floor for he thinks several hours, unable to get up. A neighbor discovered him this morning on the floor. He reports he's had unsteady gait for several weeks, using his cane. He states that he has diabetic neuropathy, but no new unilateral weakness, numbness in extremities. No vision change or hearing loss. No slurred speech or facial droop. He reported that he had heartburn earlier this morning. He took 2 "heartburn pills" which alleviated his heartburn. He had nausea and vomited twice. No hematemesis or hematochezia. Patient states that he pulled his muscle in right groin area, and has intermittent pain over that area. No new abdominal pain or diarrhea. Denies symptoms of UTI. No fever or chills. Patient denies chest pain, cough or SOB. Currently patient has confusion, oriented to place, but missed year and president's name. ED Course: pt was found to have WBC 10, negative troponin, negative urinalysis, creatinine normal, temperature normal, slightly tachycardia, oxygen saturation 100%  on room air. MRI of brain is a truncated examination due to patient confusion and inability to lie flat, but showed a small, 1 cm subdural hematoma over the posterior right convexity, no associated midline shift. Pt is admitted to [facility] as inpt. Neurosurgeon, PA, vincent Costella was consulted.  Hospital Course: Discharge Diagnoses:  Principal Problem:   SDH (subdural hematoma) (HCC) Active Problems:   Diabetes mellitus without complication (HCC)   Dyslipidemia   Depression   Essential hypertension   Type 2 diabetes mellitus ([place])   Fall   Tobacco abuse   Acute encephalopathy   GERD (gastroesophageal reflux disease)   Subdural hematoma (HCC)   Subdural hematoma - This is secondary to mechanical fall, to my eye brain significant for small 1 cm subdural hematoma over the posterior right convexity with no mass effect or midline shift, neuro surgery consultation appreciated, admitted overnight midnight for observation and frequent neuro checks, no change in mental status, no focal deficits, repeat CT head with no change in subdural hematoma, discussed with neurosurgery. Patient is cleared for discharge, to continue Keppra 500 mg twice daily for 7 days, hold aspirin, and to follow with neurosurgery in 4 weeks DM-II - cont home regimen on discharge HLD -Continue with statin HTN - Acceptable blood pressure during hospital stay off medication Depression -continue home medications Cymbalta and Depakote Tobacco abuse  -He was counseled  Discharge Instructions  Discharge Instructions    Discharge instructions    Complete by:  As directed    Follow with Primary MD Chesley Noon, MD in 7 days   Get CBC, CMP,  checked  by Primary MD  next visit.    Activity: As tolerated with Full fall precautions use walker/cane & assistance as needed   Disposition SNF   Diet: Heart Healthy , carbohydrate modified diet , with feeding assistance and aspiration precautions.  For Heart failure patients  - Check your Weight same time everyday, if you gain over 2 pounds, or you develop in leg swelling, experience more shortness of breath or chest pain, call your Primary MD immediately. Follow Cardiac Low Salt Diet and 1.5 lit/day fluid restriction.   On your next visit with your primary care physician please Get Medicines reviewed and adjusted.   Please request your Prim.MD to go over all Hospital Tests and Procedure/Radiological results at the follow up, please get all Hospital records sent to your Prim MD by signing hospital release before you go home.   If you experience worsening of your admission symptoms, develop shortness of breath, life threatening emergency, suicidal or homicidal thoughts you must seek medical attention immediately by calling 911 or calling your MD immediately  if symptoms less severe.  You Must read complete instructions/literature along with all the possible adverse reactions/side effects for all the Medicines you take and that have been prescribed to you. Take any new Medicines after you have completely understood and accpet all the possible adverse reactions/side effects.   Do not drive, operating heavy machinery, perform activities at heights, swimming or participation in water activities or provide baby sitting services if your were admitted for syncope or siezures until you have seen by Primary MD or a Neurologist and advised to do so again.  Do not drive when taking Pain medications.    Do not take more than prescribed Pain, Sleep and Anxiety Medications  Special Instructions: If you have smoked or chewed Tobacco  in the last 2 yrs please stop smoking, stop any regular Alcohol  and or any Recreational drug use.  Wear Seat belts while driving.   Please note  You were cared for by a hospitalist during your hospital stay. If you have any questions about your discharge medications or the care you received while you were in the hospital after you are discharged,  you can call the unit and asked to speak with the hospitalist on call if the hospitalist that took care of you is not available. Once you are discharged, your primary care physician will handle any further medical issues. Please note that NO REFILLS for any discharge medications will be authorized once you are discharged, as it is imperative that you return to your primary care physician (or establish a relationship with a primary care physician if you do not have one) for your aftercare needs so that they can reassess your need for medications and monitor your lab values.   Increase activity slowly    Complete by:  As directed      Allergies as of 03/18/2017      Reactions   Lisinopril Other (See Comments)   Caused upper arm and shoulder weakness   Losartan Other (See Comments)   Caused upper arm and shoulder weakness      Medication List    STOP taking these medications   aspirin 81 MG tablet   ibuprofen 800 MG tablet Commonly known as:  ADVIL,MOTRIN     TAKE these medications   albuterol 108 (90 Base) MCG/ACT inhaler Commonly known as:  PROVENTIL HFA;VENTOLIN HFA Inhale 2 puffs into the lungs every 6 (six) hours as needed. For cold symptoms What changed:  reasons to take  this  additional instructions   cholecalciferol 1000 units tablet Commonly known as:  VITAMIN D Take 1,000 Units by mouth daily.   divalproex 500 MG DR tablet Commonly known as:  DEPAKOTE Take 2 tablet of 500 mg and 1 tablet of 250 mg, total of 1250 mg every night.   divalproex 250 MG 24 hr tablet Commonly known as:  DEPAKOTE ER Take 2 tablet of 500 mg and 1 tablet of 250 mg, total of 1250 mg every night.   DULoxetine 30 MG capsule Commonly known as:  CYMBALTA Take 1 capsule (30 mg total) by mouth at bedtime.   ferrous sulfate 325 (65 FE) MG tablet Take 1 tablet (325 mg total) by mouth daily with breakfast.   folic acid 761 MCG tablet Commonly known as:  FOLVITE Take 800 mcg by mouth daily.     gabapentin 600 MG tablet Commonly known as:  NEURONTIN Take 600 mg by mouth 2 (two) times daily.   Insulin Glargine 300 UNIT/ML Sopn Commonly known as:  TOUJEO SOLOSTAR Inject 80 Units into the skin every morning. And pen needles 3/day. What changed:  how much to take   Insulin Pen Needle 31G X 8 MM Misc Use to inject insulin 3 times per day.   levETIRAcetam 500 MG tablet Commonly known as:  KEPPRA Take 1 tablet (500 mg total) by mouth 2 (two) times daily. Please take for 7 days then stop   metFORMIN 1000 MG tablet Commonly known as:  GLUCOPHAGE Take 1 tablet (1,000 mg total) by mouth 2 (two) times daily with a meal.   omega-3 acid ethyl esters 1 g capsule Commonly known as:  LOVAZA Take 2 capsules (2 g total) by mouth 2 (two) times daily.   rosuvastatin 5 MG tablet Commonly known as:  CRESTOR Take 1 tablet (5 mg total) by mouth daily.   senna-docusate 8.6-50 MG tablet Commonly known as:  Senokot-S Take 1 tablet by mouth at bedtime as needed for mild constipation.       Contact information for follow-up providers    BADGER,MICHAEL C, MD Follow up.   Specialty:  Family Medicine Contact information: [place] [address] [phone]        Traci Sermon, PA-C Follow up in 4 week(s).   Specialty:  Physician Assistant Contact information: [place] [address] [phone]            Contact information for after-discharge care    Destination    [facility] .   Specialty:  [place] information: [address] [place] [phone]                 Allergies  Allergen Reactions  . Lisinopril Other (See Comments)    Caused upper arm and shoulder weakness  . Losartan Other (See Comments)    Caused upper arm and shoulder weakness    Consultations:  Neurosurgery   Procedures/Studies:  Dg Forearm Left  Result Date:  03/14/2017 CLINICAL DATA:  Status post fall at home, with left forearm pain. Initial encounter. EXAM: LEFT FOREARM - 2 VIEW COMPARISON:  None. FINDINGS: There is no evidence of fracture or dislocation. The radius and ulna appear intact. The elbow joint is incompletely assessed, but appears grossly unremarkable. The carpal rows appear grossly intact, and demonstrate normal alignment. A peripheral IV catheter is noted overlying the mid forearm. There is medial soft tissue swelling at the elbow. IMPRESSION: No evidence  of fracture or dislocation. Electronically Signed   By: Garald Balding M.D.   On: 03/14/2017 21:31   Ct Head Wo Contrast  Result Date: 03/15/2017 CLINICAL DATA:  Follow-up exam for subdural hemorrhage. EXAM: CT HEAD WITHOUT CONTRAST TECHNIQUE: Contiguous axial images were obtained from the base of the skull through the vertex without intravenous contrast. COMPARISON:  Prior MRI from 03/14/2017. FINDINGS: Brain: Previously identified subdural hemorrhage overlying the right parietal convexity is grossly stable measuring 9 mm in maximal thickness. No significant mass effect. No evidence for interval expansion. No other acute intracranial hemorrhage. No evidence for acute large vessel territory infarct. No midline shift or hydrocephalus. No extra-axial fluid collection. Stable atrophy with chronic small vessel ischemic disease. Vascular: No hyperdense vessel. Skull: Scalp soft tissues demonstrate no acute abnormality. Calvarium intact. Sinuses/Orbits: Globes and oval soft tissues within normal limits. Scattered mucosal thickening within the ethmoidal air cells. Small retention cysts noted within the right maxillary sinus. Visualized paranasal sinuses are otherwise clear. No mastoid effusion. The IMPRESSION: 1. No significant interval change in small subdural hematoma overlying the right parietal convexity, measuring up to 9 mm on current exam. No significant mass effect. 2. No other new acute  intracranial process. 3. Generalized cerebral atrophy with moderate chronic microvascular ischemic disease. Electronically Signed   By: Jeannine Boga M.D.   On: 03/15/2017 05:35   Mr Brain Wo Contrast  Result Date: 03/14/2017 CLINICAL DATA:  Found down status post fall EXAM: MRI HEAD WITHOUT CONTRAST TECHNIQUE: Multiplanar, multiecho pulse sequences of the brain and surrounding structures were obtained without intravenous contrast. COMPARISON:  Head CT 10/12/2016 FINDINGS: The examination had to be discontinued prior to completion due to patient inability to lie on his back and follow the technologist's instructions. Sagittal T1 weighted imaging and diffusion-weighted imaging with corresponding ADC maps were obtained. There is a 1 cm posterior right convexity subdural hematoma. The diffusion-weighted images are degraded by motion, but there is no visible evidence of ischemia. IMPRESSION: 1. Truncated examination due to patient confusion and inability to lie flat. 2. Small, 1 cm subdural hematoma over the posterior right convexity. No associated midline shift. Critical Value/emergent results were called by telephone at the time of interpretation on 03/14/2017 at 7:02 pm to Dr. Orlie Dakin , who verbally acknowledged these results. Electronically Signed   By: Ulyses Jarred M.D.   On: 03/14/2017 19:06   Dg Shoulder Left  Result Date: 03/14/2017 CLINICAL DATA:  Status post fall at home, with left arm and shoulder pain. Initial encounter. EXAM: LEFT SHOULDER - 2+ VIEW COMPARISON:  None. FINDINGS: There is no evidence of fracture or dislocation. The left humeral head is seated within the glenoid fossa. Mild degenerative change is noted at the left acromioclavicular joint. No significant soft tissue abnormalities are seen. The visualized portions of the left lung are clear. IMPRESSION: No evidence of fracture or dislocation. Electronically Signed   By: Garald Balding M.D.   On: 03/14/2017 21:29   Dg  Humerus Left  Result Date: 03/14/2017 CLINICAL DATA:  Status post fall at home, with left arm and shoulder pain. Initial encounter. EXAM: LEFT HUMERUS - 2+ VIEW COMPARISON:  None. FINDINGS: There is no evidence of fracture or dislocation. The left humerus appears intact. The elbow joint is grossly unremarkable, though incompletely assessed. The left humeral head remains seated at the glenoid fossa. No significant soft tissue abnormalities are characterized on radiograph. IMPRESSION: No evidence of fracture or dislocation. Electronically Signed   By: Garald Balding  M.D.   On: 03/14/2017 21:30     Subjective: - no chest pain, shortness of breath, no abdominal pain, nausea or vomiting.   Discharge Exam: Vitals:   03/18/17 0500 03/18/17 1101  BP: 132/76 (!) 147/81  Pulse: 73 84  Resp: 18 18  Temp: 98 F (36.7 C) 98.4 F (36.9 C)   Vitals:   03/17/17 2127 03/18/17 0059 03/18/17 0500 03/18/17 1101  BP: 123/81 128/77 132/76 (!) 147/81  Pulse: 76 82 73 84  Resp: 18 18 18 18   Temp: 97.8 F (36.6 C) 97.8 F (36.6 C) 98 F (36.7 C) 98.4 F (36.9 C)  TempSrc: Oral Oral Oral Oral  SpO2: 100% 100% 100% 100%  Weight:      Height:        General: Pt is alert, awake, not in acute distress Cardiovascular: RRR, S1/S2 +, no rubs, no gallops Respiratory: CTA bilaterally, no wheezing, no rhonchi Abdominal: Soft, NT, ND, bowel sounds + Extremities: no edema, no cyanosis    The results of significant diagnostics from this hospitalization (including imaging, microbiology, ancillary and laboratory) are listed below for reference.     Microbiology: Recent Results (from the past 240 hour(s))  MRSA PCR Screening     Status: None   Collection Time: 03/14/17 10:44 PM  Result Value Ref Range Status   MRSA by PCR NEGATIVE NEGATIVE Final    Comment:        The GeneXpert MRSA Assay (FDA approved for NASAL specimens only), is one component of a comprehensive MRSA colonization surveillance  program. It is not intended to diagnose MRSA infection nor to guide or monitor treatment for MRSA infections.      Labs: BNP (last 3 results) No results for input(s): BNP in the last 8760 hours. Basic Metabolic Panel:  Recent Labs Lab 03/14/17 1822 03/14/17 1836 03/15/17 0428  NA 132* 131* 131*  K 4.2 4.3 3.8  CL 99* 98* 97*  CO2 23  --  22  GLUCOSE 201* 196* 154*  BUN 11 11 8   CREATININE 0.87 0.80 0.81  CALCIUM 9.3  --  9.0   Liver Function Tests:  Recent Labs Lab 03/14/17 1822  AST 20  ALT 12*  ALKPHOS 55  BILITOT 0.9  PROT 7.7  ALBUMIN 4.4    Recent Labs Lab 03/14/17 1948  LIPASE 14   No results for input(s): AMMONIA in the last 168 hours. CBC:  Recent Labs Lab 03/14/17 1822 03/14/17 1836 03/15/17 0428  WBC 10.0  --  7.1  NEUTROABS 8.2*  --   --   HGB 15.2 15.3 13.4  HCT 42.0 45.0 37.5*  MCV 85.4  --  85.2  PLT 164  --  148*   Cardiac Enzymes:  Recent Labs Lab 03/14/17 1822  CKTOTAL 237   BNP: Invalid input(s): POCBNP CBG:  Recent Labs Lab 03/17/17 1133 03/17/17 1711 03/17/17 2128 03/18/17 0628 03/18/17 1115  GLUCAP 108* 233* 129* 87 144*   D-Dimer No results for input(s): DDIMER in the last 72 hours. Hgb A1c No results for input(s): HGBA1C in the last 72 hours. Lipid Profile No results for input(s): CHOL, HDL, LDLCALC, TRIG, CHOLHDL, LDLDIRECT in the last 72 hours. Thyroid function studies No results for input(s): TSH, T4TOTAL, T3FREE, THYROIDAB in the last 72 hours.  Invalid input(s): FREET3 Anemia work up No results for input(s): VITAMINB12, FOLATE, FERRITIN, TIBC, IRON, RETICCTPCT in the last 72 hours. Urinalysis    Component Value Date/Time   COLORURINE YELLOW 03/14/2017 1739  APPEARANCEUR CLEAR 03/14/2017 1739   LABSPEC 1.008 03/14/2017 1739   PHURINE 7.0 03/14/2017 1739   GLUCOSEU >=500 (A) 03/14/2017 1739   HGBUR NEGATIVE 03/14/2017 1739   HGBUR negative 05/20/2009 1155   BILIRUBINUR NEGATIVE 03/14/2017  1739   KETONESUR 20 (A) 03/14/2017 1739   PROTEINUR NEGATIVE 03/14/2017 1739   UROBILINOGEN 0.2 09/25/2015 2110   NITRITE NEGATIVE 03/14/2017 1739   LEUKOCYTESUR NEGATIVE 03/14/2017 1739   Sepsis Labs Invalid input(s): PROCALCITONIN,  WBC,  LACTICIDVEN Microbiology Recent Results (from the past 240 hour(s))  MRSA PCR Screening     Status: None   Collection Time: 03/14/17 10:44 PM  Result Value Ref Range Status   MRSA by PCR NEGATIVE NEGATIVE Final    Comment:        The GeneXpert MRSA Assay (FDA approved for NASAL specimens only), is one component of a comprehensive MRSA colonization surveillance program. It is not intended to diagnose MRSA infection nor to guide or monitor treatment for MRSA infections.     Time coordinating discharge: 35 minutes  SIGNED:  Marzetta Board, MD  [facility] 03/18/2017, 11:23 AM Pager [phone]  If 7PM-7AM, please contact night-coverage [email or web address] Password [email or web address]

## 2017-03-18 NOTE — Clinical Social Work Placement (Signed)
   CLINICAL SOCIAL WORK PLACEMENT  NOTE  Date:  03/18/2017  Patient Details  Name: Caleb Joseph MRN: [id number] Date of Birth: Sep 01, 1956  Clinical Social Work is seeking post-discharge placement for this patient at the [place] level of care (*CSW will initial, date and re-position this form in  chart as items are completed):  Yes   Patient/family provided with [facility] Work Department's list of facilities offering this level of care within the geographic area requested by the patient (or if unable, by the patient's family).  Yes   Patient/family informed of their freedom to choose among providers that offer the needed level of care, that participate in Medicare, Medicaid or managed care program needed by the patient, have an available bed and are willing to accept the patient.  Yes   Patient/family informed of [facility]'s ownership interest in [facility] and [facility], as well as of the fact that they are under no obligation to receive care at these facilities.  PASRR submitted to EDS on 03/15/17     PASRR number received on 03/18/17     Existing PASRR number confirmed on       FL2 transmitted to all facilities in geographic area requested by pt/family on 03/15/17     FL2 transmitted to all facilities within larger geographic area on       Patient informed that his/her managed care company has contracts with or will negotiate with certain facilities, including the following:        Yes   Patient/family informed of bed offers received.  Patient chooses bed at [facility]     Physician recommends and patient chooses bed at      Patient to be transferred to [facility] on 03/18/17.  Patient to be transferred to facility by [facility]     Patient family notified on 03/18/17 of transfer.  Name of family member notified:  Patient declined     PHYSICIAN Please prepare prescriptions     Additional Comment:     _______________________________________________ Candie Chroman, LCSW 03/18/2017, 12:37 PM

## 2017-03-18 NOTE — Clinical Social Work Note (Addendum)
PASARR obtained: [id number]. CSW faxed updated PT note to [facility] for updated insurance authorization. [facility] continues to have a bed for him today and can take him after lunch.  Dayton Scrape, [place] [phone]  12:07 pm Insurance authorization obtained: [id number], rug level RUB. SNF admissions coordinator and MD notified.  Dayton Scrape, [place]

## 2017-03-18 NOTE — Significant Event (Signed)
Patient is discharged to [facility], taken by [facility]. Review AVS with patient. Patient verbalize understanding in AVS. Paperwork for the receiving facility set up by CSW is taken by [facility]. Patient has all his belongings with him (clothes, shoes, cane). Patient did not want RN to notify family. Patient left unit at 1419pm. Report has been given to receiving staff Mickel Baas.     Caleb Joseph

## 2017-03-18 NOTE — Progress Notes (Signed)
Physical Therapy Treatment Patient Details Name: Caleb Joseph MRN: [id number] DOB: 08-Feb-1956 Today's Date: 03/18/2017    History of Present Illness Pt is a 61 y/o admitted with AMS and small SDH after a fall at home. PMH: DM, peripheral neuropathy, HTN, R toe amputation, depression, tremor.    PT Comments    Patient seen for mobility progression. At this time, patient remains unsteady with ambulation (high fall risk). Patient continues to show inability to safely mobilize during cognitive performance and requires hands on assist for OOB activity. Continue to recommend ST SNF. Will follow acutely.   Follow Up Recommendations  SNF;Supervision/Assistance - 24 hour     Equipment Recommendations  None recommended by PT (defer to next venue)    Recommendations for Other Services       Precautions / Restrictions Precautions Precautions: Fall Restrictions Weight Bearing Restrictions: No    Mobility  Bed Mobility Overal bed mobility: Needs Assistance Bed Mobility: Supine to Sit;Sit to Supine     Supine to sit: Min guard;HOB elevated Sit to supine: Min guard;HOB elevated   General bed mobility comments: increased time to perform, min guard for safety  Transfers Overall transfer level: Needs assistance Equipment used: Rolling walker (2 wheeled) Transfers: Sit to/from Stand Sit to Stand: Min assist         General transfer comment: VCs for hand placement and positioning, increased time to elevate to standing, min assist for stability'  Ambulation/Gait Ambulation/Gait assistance: Min assist;Mod assist Ambulation Distance (Feet): 70 Feet (70 ft trials x3) Assistive device: Rolling walker (2 wheeled) Gait Pattern/deviations: Step-to pattern;Step-through pattern;Decreased step length - right;Decreased step length - left;Decreased stride length;Shuffle;Wide base of support Gait velocity: decreased Gait velocity interpretation: Below normal speed for age/gender General Gait  Details: pt continues to demonstrate insteability with ambulation and poor awareness of environment. Multiple cues for positioning and safety with use of RW. easily distracted. Min assist for majority of ambulation trials, moderate assist with multiple LOB when distracted or attempting to communicate with others in the hall.    Stairs            Wheelchair Mobility    Modified Rankin (Stroke Patients Only)       Balance Overall balance assessment: Needs assistance Sitting-balance support: Feet supported Sitting balance-Leahy Scale: Fair Sitting balance - Comments: pt sat EOB with min guard   Standing balance support: During functional activity;No upper extremity supported Standing balance-Leahy Scale: Poor Standing balance comment: posterior lean with attempt to use urinal at bedside                            Cognition Arousal/Alertness: Awake/alert (sleepy) Behavior During Therapy: Flat affect Overall Cognitive Status: Impaired/Different from baseline Area of Impairment: Orientation;Memory;Following commands;Safety/judgement;Problem solving                 Orientation Level: Disoriented to;Time   Memory: Decreased short-term memory Following Commands: Follows one step commands with increased time Safety/Judgement: Decreased awareness of safety;Decreased awareness of deficits   Problem Solving: Slow processing;Decreased initiation;Difficulty sequencing;Requires verbal cues;Requires tactile cues General Comments: patient easily distracted throughout session, poor insight and awareness. Very methodical in task completion once engaged. Does not recognize deficits as impactful to mobility      Exercises Other Exercises Other Exercises: LE coordination activities with toe taps in seated position 3 trials x5 bilateral Other Exercises: LAQs with resistance    General Comments  Pertinent Vitals/Pain Pain Assessment: Faces Faces Pain Scale: Hurts  little more Pain Location: head Pain Descriptors / Indicators: Headache Pain Intervention(s): Monitored during session    Home Living                      Prior Function            PT Goals (current goals can now be found in the care plan section) Acute Rehab PT Goals Patient Stated Goal: to return home to his cat PT Goal Formulation: With patient Time For Goal Achievement: 03/29/17 Potential to Achieve Goals: Good Progress towards PT goals: Progressing toward goals    Frequency    Min 3X/week      PT Plan Current plan remains appropriate    Co-evaluation             End of Session Equipment Utilized During Treatment: Gait belt Activity Tolerance: Patient tolerated treatment well Patient left: in bed;with call bell/phone within reach;with bed alarm set Nurse Communication: Mobility status PT Visit Diagnosis: Unsteadiness on feet (R26.81);Other abnormalities of gait and mobility (R26.89)     Time: 4142-3953 PT Time Calculation (min) (ACUTE ONLY): 24 min  Charges:  $Gait Training: 8-22 mins $Therapeutic Activity: 8-22 mins                    G Codes:       Alben Deeds, PT DPT  [phone]    Duncan Dull 03/18/2017, 8:47 AM

## 2017-03-18 NOTE — Care Management Note (Signed)
Case Management Note  Patient Details  Name: Caleb Joseph MRN: [id number] Date of Birth: Mar 29, 1956  Subjective/Objective:                    Action/Plan: Pt discharging to [facility] today. No further needs per CM.   Expected Discharge Date:  03/18/17               Expected Discharge Plan:  Skilled Nursing Facility  In-House Referral:  Clinical Social Work  Discharge planning Services  CM Consult  Post Acute Care Choice:    Choice offered to:     DME Arranged:    DME Agency:     HH Arranged:    [place] Agency:     Status of Service:  Completed, signed off  If discussed at [employer], dates discussed:    Additional Comments:  Pollie Friar, RN 03/18/2017, 1:11 PM

## 2017-03-18 NOTE — Progress Notes (Signed)
Late entry due to missed g-code.   04/13/2017 1200  OT G-codes **NOT FOR INPATIENT CLASS**  Functional Assessment Tool Used Clinical judgement  Functional Limitation Self care  Self Care Current Status [phone]) CK  Self Care Goal Status (L9747) CI  13-Apr-2017 Nestor Lewandowsky, OTR/L Pager: [phone]

## 2017-03-20 ENCOUNTER — Encounter: Payer: Self-pay | Admitting: Internal Medicine

## 2017-03-20 ENCOUNTER — Non-Acute Institutional Stay (SKILLED_NURSING_FACILITY): Payer: Medicare HMO | Admitting: Internal Medicine

## 2017-03-20 DIAGNOSIS — K5900 Constipation, unspecified: Secondary | ICD-10-CM

## 2017-03-20 DIAGNOSIS — R531 Weakness: Secondary | ICD-10-CM | POA: Diagnosis not present

## 2017-03-20 DIAGNOSIS — E1142 Type 2 diabetes mellitus with diabetic polyneuropathy: Secondary | ICD-10-CM

## 2017-03-20 DIAGNOSIS — I6200 Nontraumatic subdural hemorrhage, unspecified: Secondary | ICD-10-CM

## 2017-03-20 DIAGNOSIS — E871 Hypo-osmolality and hyponatremia: Secondary | ICD-10-CM

## 2017-03-20 DIAGNOSIS — F329 Major depressive disorder, single episode, unspecified: Secondary | ICD-10-CM | POA: Diagnosis not present

## 2017-03-20 DIAGNOSIS — E611 Iron deficiency: Secondary | ICD-10-CM

## 2017-03-20 DIAGNOSIS — E785 Hyperlipidemia, unspecified: Secondary | ICD-10-CM

## 2017-03-20 DIAGNOSIS — F32A Depression, unspecified: Secondary | ICD-10-CM

## 2017-03-20 DIAGNOSIS — S065XAA Traumatic subdural hemorrhage with loss of consciousness status unknown, initial encounter: Secondary | ICD-10-CM

## 2017-03-20 DIAGNOSIS — S065X9A Traumatic subdural hemorrhage with loss of consciousness of unspecified duration, initial encounter: Secondary | ICD-10-CM

## 2017-03-20 DIAGNOSIS — G44319 Acute post-traumatic headache, not intractable: Secondary | ICD-10-CM

## 2017-03-20 NOTE — Progress Notes (Signed)
PT Progress Note Addendum for G-Codes    04/02/2017 0905  PT G-Codes **NOT FOR INPATIENT CLASS**  Functional Assessment Tool Used AM-PAC 6 Clicks Basic Mobility;Clinical judgement  Functional Limitation Mobility: Walking and moving around  Mobility: Walking and Moving Around Current Status [phone]) CK  Mobility: Walking and Moving Around Goal Status [phone]) CI   Sherie Don, [place], DPT [phone]

## 2017-03-20 NOTE — Progress Notes (Signed)
LOCATION: [facility]  PCP: Chesley Noon, MD   Code Status: Full Code  Goals of care: Advanced Directive information Advanced Directives 03/14/2017  Does Patient Have a Medical Advance Directive? No  Would patient like information on creating a medical advance directive? No - Patient declined  Pre-existing out of facility DNR order (yellow form or pink MOST form) -       Extended Emergency Contact Information Primary Emergency Contact: Mccree,[name] Address: [address]          [place], [place] [address] [place] Phone: [phone] Relation: Brother Secondary Emergency Contact: Combs,Lyle Address: [address], [place] [address] [name] Phone: [phone] Mobile Phone: [phone] Relation: Brother   Allergies  Allergen Reactions  . Lisinopril Other (See Comments)    Caused upper arm and shoulder weakness  . Losartan Other (See Comments)    Caused upper arm and shoulder weakness    Chief Complaint  Patient presents with  . New Admit To SNF    New Admission Visit      HPI:  Patient is a 61 y.o. male seen today for short term rehabilitation post hospital admission from 03/14/17-03/18/17 post fall with subdural hematoma. Neurosurgery was consulted and recommended neurochecks and repeat CT head. Repeat scan showed the bleed to be stable. He has pmh of HTN, HLD, DM, depression, resting tremors. He is seen in his room today.   Review of Systems:  Constitutional: Negative for fever, chills, diaphoresis.  HENT: Negative for congestion, nasal discharge, sore throat, difficulty swallowing. Positive for occasional generalized headaches.   Eyes: Negative for eye pain, blurred vision, double vision and discharge.  Respiratory: Negative for cough, shortness of breath and wheezing.   Cardiovascular: Negative for chest pain, palpitations, leg swelling.  Gastrointestinal: Negative for heartburn, nausea,  vomiting, abdominal pain, loss of appetite, melena, diarrhea. Last bowel movement was in the hospital. At home patient has a bowel movement every other day. Genitourinary: Negative for dysuria.  Musculoskeletal: Negative for back pain, fall in the facility. Positive for pain to left arm.  Skin: Negative for itching, rash.  Neurological: Negative for dizziness. Has diabetic neuropathy. Psychiatric/Behavioral: Negative for depression.   Past Medical History:  Diagnosis Date  . Abscess of prostate 02/17/2009  . Diabetes mellitus   . Diabetic foot infection ([place]) 12/26/2011  . 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 HYPERLIPIDEMIA 08/26/2007  . Hypertension   . 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 AMPUTATION  12/30/2011   Procedure: AMPUTATION DIGIT;  Surgeon: Marybelle Killings, MD;  Location: [facility];  Service: Orthopedics;  Laterality: Left;  Left [place]  . AMPUTATION  03/20/2012   Procedure: AMPUTATION DIGIT;  Surgeon: Mcarthur Rossetti, MD;  Location: [facility];  Service: Orthopedics;  Laterality: Left;  Left foot 3rd toe amputation  . CARDIAC CATHETERIZATION    . I&D EXTREMITY Left 09/28/2015   Procedure: IRRIGATION AND DRAINAGE OF LEFT FOOT with second and third metatarsal amputation;  Surgeon: Mcarthur Rossetti, MD;  Location: [facility];  Service: Orthopedics;  Laterality: Left;  . TOE AMPUTATION  08/2009   R little toe   Social History:   reports that he has been smoking Cigars and Pipe.  His smokeless tobacco use includes Chew and Snuff. He reports that he drinks alcohol. He reports that he does not use drugs.  Family History  Problem Relation Age of Onset  . Diabetes Mother     Medications: Allergies as of 03/20/2017      Reactions   Lisinopril Other (See Comments)   Caused upper arm and shoulder weakness   Losartan Other (See Comments)   Caused upper arm and shoulder weakness      Medication List         Accurate as of 03/20/17 12:02 PM. Always use your most recent med list.          albuterol 108 (90 Base) MCG/ACT inhaler Commonly known as:  PROVENTIL HFA;VENTOLIN HFA Inhale 2 puffs into the lungs every 6 (six) hours as needed. For cold symptoms   cholecalciferol 1000 units tablet Commonly known as:  VITAMIN D Take 1,000 Units by mouth daily.   divalproex 250 MG DR tablet Commonly known as:  DEPAKOTE Take 250 mg by mouth at bedtime. Take along with 2 500 mg tabs   divalproex 500 MG DR tablet Commonly known as:  DEPAKOTE Take 2 tablet of 500 mg and 1 tablet of 250 mg, total of 1250 mg every night.   DULoxetine 30 MG capsule Commonly known as:  CYMBALTA Take 1 capsule (30 mg total) by mouth at bedtime.   ferrous sulfate 325 (65 FE) MG tablet Take 1 tablet (325 mg total) by mouth daily with breakfast.   folic acid 431 MCG tablet Commonly known as:  FOLVITE Take 800 mcg by mouth daily.   gabapentin 600 MG tablet Commonly known as:  NEURONTIN Take 600 mg by mouth 2 (two) times daily.   Insulin Glargine 300 UNIT/ML Sopn Commonly known as:  TOUJEO SOLOSTAR Inject 80 Units into the skin every morning. And pen needles 3/day.   Insulin Pen Needle 31G X 8 MM Misc Use to inject insulin 3 times per day.   levETIRAcetam 500 MG tablet Commonly known as:  KEPPRA Take 1 tablet (500 mg total) by mouth 2 (two) times daily. Please take for 7 days then stop   metFORMIN 1000 MG tablet Commonly known as:  GLUCOPHAGE Take 1 tablet (1,000 mg total) by mouth 2 (two) times daily with a meal.   omega-3 acid ethyl esters 1 g capsule Commonly known as:  LOVAZA Take 2 capsules (2 g total) by mouth 2 (two) times daily.   rosuvastatin 5 MG tablet Commonly known as:  CRESTOR Take 1 tablet (5 mg total) by mouth daily.   senna-docusate 8.6-50 MG tablet Commonly known as:  Senokot-S Take 1 tablet by mouth at bedtime as needed for mild constipation.        Immunizations: Immunization History  Administered Date(s) Administered  . Influenza Whole 10/07/2008, 10/25/2009, 01/11/2011  . PPD Test 03/18/2017  . Pneumococcal Polysaccharide-23 01/25/2007, 09/27/2015  . Td 11/27/1999     Physical Exam: Vitals:   03/20/17 1156  BP: 124/83  Pulse: 92  Resp: 19  Temp: 97.7 F (36.5 C)  TempSrc: Oral  SpO2: 96%  Weight: 249 lb (112.9 kg)  Height: 6\' 4"  (1.93 m)   Body mass index is 30.31 kg/m.  General- elderly male, obese, in no acute distress Head- normocephalic, atraumatic Nose- no maxillary or frontal sinus tenderness, no nasal discharge Throat- moist mucus membrane, normal oropharynx, poor dentition  Eyes- PERRLA, EOMI, no pallor, no icterus, no discharge, normal conjunctiva, normal sclera Neck- no cervical lymphadenopathy Cardiovascular- normal s1,s2, no murmur Respiratory- bilateral clear to auscultation, no wheeze, no rhonchi, no crackles, no use of accessory muscles Abdomen- bowel sounds present, soft,  non tender, no guarding or rigidity Musculoskeletal- able to move all 4 extremities, generalized weakness Neurological- alert and oriented to person, place and time, restring tremor to left hand Skin- warm and dry Psychiatry- normal mood and affect    Labs reviewed: Basic Metabolic Panel:  Recent Labs  10/13/16 0425 10/14/16 0521 03/14/17 1822 03/14/17 1836 03/15/17 0428  NA 136 134* 132* 131* 131*  K 4.0 3.9 4.2 4.3 3.8  CL 102 103 99* 98* 97*  CO2 24 22 23   --  22  GLUCOSE 225* 222* 201* 196* 154*  BUN 22* 20 11 11 8   CREATININE 1.14 1.08 0.87 0.80 0.81  CALCIUM 8.9 8.2* 9.3  --  9.0  MG 2.1  --   --   --   --   PHOS 3.4  --   --   --   --    Liver Function Tests:  Recent Labs  10/12/16 1530 10/13/16 0425 03/14/17 1822  AST 30 20 20   ALT 20 15* 12*  ALKPHOS 63 50 55  BILITOT 1.4* 1.1 0.9  PROT 7.9 6.3* 7.7  ALBUMIN 4.7 3.7 4.4    Recent Labs  03/14/17 1948  LIPASE 14    Recent Labs   10/12/16 1830  AMMONIA 21   CBC:  Recent Labs  10/12/16 1530  10/13/16 0425 10/14/16 0521 03/14/17 1822 03/14/17 1836 03/15/17 0428  WBC 8.6  --  10.4 6.8 10.0  --  7.1  NEUTROABS 5.7  --  6.1  --  8.2*  --   --   HGB 15.5  < > 13.2 12.7* 15.2 15.3 13.4  HCT 43.2  < > 37.9* 37.7* 42.0 45.0 37.5*  MCV 86.2  --  86.1 88.1 85.4  --  85.2  PLT 147*  --  150 122* 164  --  148*  < > = values in this interval not displayed. Cardiac Enzymes:  Recent Labs  03/14/17 1822  CKTOTAL 237   BNP: Invalid input(s): POCBNP CBG:  Recent Labs  03/17/17 2128 03/18/17 0628 03/18/17 1115  GLUCAP 129* 87 144*    Radiological Exams: Dg Forearm Left  Result Date: 03/14/2017 CLINICAL DATA:  Status post fall at home, with left forearm pain. Initial encounter. EXAM: LEFT FOREARM - 2 VIEW COMPARISON:  None. FINDINGS: There is no evidence of fracture or dislocation. The radius and ulna appear intact. The elbow joint is incompletely assessed, but appears grossly unremarkable. The carpal rows appear grossly intact, and demonstrate normal alignment. A peripheral IV catheter is noted overlying the mid forearm. There is medial soft tissue swelling at the elbow. IMPRESSION: No evidence of fracture or dislocation. Electronically Signed   By: Garald Balding M.D.   On: 03/14/2017 21:31   Ct Head Wo Contrast  Result Date: 03/15/2017 CLINICAL DATA:  Follow-up exam for subdural hemorrhage. EXAM: CT HEAD WITHOUT CONTRAST TECHNIQUE: Contiguous axial images were obtained from the base of the skull through the vertex without intravenous contrast. COMPARISON:  Prior MRI from 03/14/2017. FINDINGS: Brain: Previously identified subdural hemorrhage overlying the right parietal convexity is grossly stable measuring 9 mm in maximal thickness. No significant mass effect. No evidence for interval expansion. No other acute intracranial hemorrhage. No evidence for acute large vessel territory infarct. No midline shift or  hydrocephalus. No extra-axial fluid collection. Stable atrophy with chronic small vessel ischemic disease. Vascular: No hyperdense vessel. Skull: Scalp soft tissues demonstrate no acute abnormality. Calvarium intact. Sinuses/Orbits: Globes and oval soft tissues within normal limits. Scattered  mucosal thickening within the ethmoidal air cells. Small retention cysts noted within the right maxillary sinus. Visualized paranasal sinuses are otherwise clear. No mastoid effusion. The IMPRESSION: 1. No significant interval change in small subdural hematoma overlying the right parietal convexity, measuring up to 9 mm on current exam. No significant mass effect. 2. No other new acute intracranial process. 3. Generalized cerebral atrophy with moderate chronic microvascular ischemic disease. Electronically Signed   By: Jeannine Boga M.D.   On: 03/15/2017 05:35   Mr Brain Wo Contrast  Result Date: 03/14/2017 CLINICAL DATA:  Found down status post fall EXAM: MRI HEAD WITHOUT CONTRAST TECHNIQUE: Multiplanar, multiecho pulse sequences of the brain and surrounding structures were obtained without intravenous contrast. COMPARISON:  Head CT 10/12/2016 FINDINGS: The examination had to be discontinued prior to completion due to patient inability to lie on his back and follow the technologist's instructions. Sagittal T1 weighted imaging and diffusion-weighted imaging with corresponding ADC maps were obtained. There is a 1 cm posterior right convexity subdural hematoma. The diffusion-weighted images are degraded by motion, but there is no visible evidence of ischemia. IMPRESSION: 1. Truncated examination due to patient confusion and inability to lie flat. 2. Small, 1 cm subdural hematoma over the posterior right convexity. No associated midline shift. Critical Value/emergent results were called by telephone at the time of interpretation on 03/14/2017 at 7:02 pm to Dr. Orlie Dakin , who verbally acknowledged these results.  Electronically Signed   By: Ulyses Jarred M.D.   On: 03/14/2017 19:06   Dg Shoulder Left  Result Date: 03/14/2017 CLINICAL DATA:  Status post fall at home, with left arm and shoulder pain. Initial encounter. EXAM: LEFT SHOULDER - 2+ VIEW COMPARISON:  None. FINDINGS: There is no evidence of fracture or dislocation. The left humeral head is seated within the glenoid fossa. Mild degenerative change is noted at the left acromioclavicular joint. No significant soft tissue abnormalities are seen. The visualized portions of the left lung are clear. IMPRESSION: No evidence of fracture or dislocation. Electronically Signed   By: Garald Balding M.D.   On: 03/14/2017 21:29   Dg Humerus Left  Result Date: 03/14/2017 CLINICAL DATA:  Status post fall at home, with left arm and shoulder pain. Initial encounter. EXAM: LEFT HUMERUS - 2+ VIEW COMPARISON:  None. FINDINGS: There is no evidence of fracture or dislocation. The left humerus appears intact. The elbow joint is grossly unremarkable, though incompletely assessed. The left humeral head remains seated at the glenoid fossa. No significant soft tissue abnormalities are characterized on radiograph. IMPRESSION: No evidence of fracture or dislocation. Electronically Signed   By: Garald Balding M.D.   On: 03/14/2017 21:30    Assessment/Plan  Generalized weakness Will have him work with physical therapy and occupational therapy team to help with gait training and muscle strengthening exercises.fall precautions. Skin care. Encourage to be out of bed.   Subdural hematoma Stable on repeat ct head in hospital. Will need f/u with neurosurgery in 4 weeks. Continue keppra 500 mg bid x 1 week for seizure prophylaxis. Hold aspirin for now until f/u with neurosurgery  Hyponatremia Monitor BMP  HTN Stable BP, currently not on any antihypertensives. Monitor BP  HLD Continue statin  DM type 2 with neuropathy Lab Results  Component Value Date   HGBA1C 11.2 03/06/2017    a1c suggestive of uncontrolled diabetes. Continue toujeo 80 u daily, metformin 1000 mg bid, monitor cbg. Continue statin. Continue gabapentin.   Headache Add tylenol 650 mg q6h prn pain  and monitor  Depression Continue depakote and cymbalta current regimen and monitor mood  Iron def anemia Continue ferrous sulfate and monitor cbc  Constipation Currently on senna-docusate 1 tab qhs prn, change this to 2 tab qhs for 3 days, then 1 tab daily and monitor   Goals of care: short term rehabilitation   Labs/tests ordered: cbc, bmp 03/25/17   Family/ staff Communication: reviewed care plan with patient and nursing supervisor  I have spent greater than 50 minutes for this encounter which includes reviewing hospital records, addressing above mentioned concerns, reviewing care plan with patient, answering patient's concerns and counseling.     Blanchie Serve, MD Internal Medicine [facility] [address] [place], [place] [address] Cell Phone (Monday-Friday 8 am - 5 pm): [phone] On Call: [phone] and follow prompts after 5 pm and on weekends Office Phone: [phone] Office Fax: [phone]

## 2017-03-21 ENCOUNTER — Non-Acute Institutional Stay (SKILLED_NURSING_FACILITY): Payer: Medicare HMO | Admitting: Internal Medicine

## 2017-03-21 ENCOUNTER — Other Ambulatory Visit: Payer: Self-pay | Admitting: Internal Medicine

## 2017-03-21 ENCOUNTER — Encounter: Payer: Self-pay | Admitting: Internal Medicine

## 2017-03-21 DIAGNOSIS — R51 Headache: Secondary | ICD-10-CM

## 2017-03-21 DIAGNOSIS — S065XAA Traumatic subdural hemorrhage with loss of consciousness status unknown, initial encounter: Secondary | ICD-10-CM

## 2017-03-21 DIAGNOSIS — I6200 Nontraumatic subdural hemorrhage, unspecified: Secondary | ICD-10-CM

## 2017-03-21 DIAGNOSIS — R42 Dizziness and giddiness: Secondary | ICD-10-CM | POA: Diagnosis not present

## 2017-03-21 DIAGNOSIS — W19XXXA Unspecified fall, initial encounter: Secondary | ICD-10-CM | POA: Diagnosis not present

## 2017-03-21 DIAGNOSIS — R400 Somnolence: Secondary | ICD-10-CM | POA: Diagnosis not present

## 2017-03-21 DIAGNOSIS — S065X9A Traumatic subdural hemorrhage with loss of consciousness of unspecified duration, initial encounter: Secondary | ICD-10-CM

## 2017-03-21 DIAGNOSIS — R2681 Unsteadiness on feet: Secondary | ICD-10-CM | POA: Diagnosis not present

## 2017-03-21 DIAGNOSIS — S069X0A Unspecified intracranial injury without loss of consciousness, initial encounter: Secondary | ICD-10-CM

## 2017-03-21 DIAGNOSIS — R519 Headache, unspecified: Secondary | ICD-10-CM

## 2017-03-21 NOTE — Progress Notes (Signed)
LOCATION: [facility]  PCP: Chesley Noon, MD   Code Status: Full Code  Goals of care: Advanced Directive information Advanced Directives 03/14/2017  Does Patient Have a Medical Advance Directive? No  Would patient like information on creating a medical advance directive? No - Patient declined  Pre-existing out of facility DNR order (yellow form or pink MOST form) -       Extended Emergency Contact Information Primary Emergency Contact: [name],[name] Address: [address]          [place], [name] [address] [name] Phone: [phone] Relation: Brother Secondary Emergency Contact: Combs,Lyle Address: [address], [place] [address] [name] of [place] Phone: [phone] Mobile Phone: [phone] Relation: Brother   Allergies  Allergen Reactions  . Lisinopril Other (See Comments)    Caused upper arm and shoulder weakness  . Losartan Other (See Comments)    Caused upper arm and shoulder weakness    Chief Complaint  Patient presents with  . Acute Visit    Fall last night, increased sleepness and headache     HPI:  Patient is a 61 y.o. male seen today for acute visit. He had a fall yesterday afternoon and hit his head. he did not lose consciousness. Provider was not notified about the fall until now. He complaints of headache to his right side. He mentions that he has had a dull headache on and off since his hospitalization. Denies any worsening of headache. Mentions having blurred vision yesterday which has now resolved. He mentions having intermittent headache at home and taking aspirin and ibuprofen at home that helped him. He took ibuprofen at home on regular basis for his arm pain as well. Per nursing, he has increase sleepiness during daytime. He also complaints of bad heartburn today. He is here for short term rehabilitation post hospital admission from 03/14/17-03/18/17 post fall with subdural  hematoma.   Review of Systems:  Constitutional: Negative for fever, chills, diaphoresis.  HENT: Negative for congestion, neck pain, nasal discharge, sinus tenderness, sore throat, difficulty swallowing. Positive for generalized headache.   Eyes: Negative for eye pain, blurred vision, double vision and discharge.  Respiratory: Negative for cough, shortness of breath Cardiovascular: Negative for chest pain.  Gastrointestinal: Negative for nausea, vomiting, abdominal pain Genitourinary: Negative for dysuria.  positive for urge incontinence Musculoskeletal: Negative for back pain. Skin: Negative for itching, rash.  Neurological: positive for ocassional dizziness. Has diabetic neuropathy and unsteady gait. Psychiatric/Behavioral: history of bipolar disorder    Past Medical History:  Diagnosis Date  . Abscess of prostate 02/17/2009  . Diabetes mellitus   . Diabetic foot infection ([facility]) 12/26/2011  . 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 HYPERLIPIDEMIA 08/26/2007  . Hypertension   . 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 AMPUTATION  12/30/2011   Procedure: AMPUTATION DIGIT;  Surgeon: Marybelle Killings, MD;  Location: [facility];  Service: Orthopedics;  Laterality: Left;  Left Great  . AMPUTATION  03/20/2012   Procedure: AMPUTATION DIGIT;  Surgeon: Mcarthur Rossetti, MD;  Location: [facility];  Service: Orthopedics;  Laterality: Left;  Left foot 3rd toe amputation  . CARDIAC CATHETERIZATION    . I&D EXTREMITY Left 09/28/2015   Procedure: IRRIGATION AND DRAINAGE OF LEFT FOOT with second and third metatarsal amputation;  Surgeon: Mcarthur Rossetti, MD;  Location: [facility];  Service: Orthopedics;  Laterality: Left;  .  TOE AMPUTATION  08/2009   R little toe   Social History:   reports that he has been smoking Cigars and Pipe.  His smokeless tobacco use includes Chew and Snuff. He reports that he drinks alcohol. He reports  that he does not use drugs.  Family History  Problem Relation Age of Onset  . Diabetes Mother     Medications: Allergies as of 03/21/2017      Reactions   Lisinopril Other (See Comments)   Caused upper arm and shoulder weakness   Losartan Other (See Comments)   Caused upper arm and shoulder weakness      Medication List       Accurate as of 03/21/17  3:41 PM. Always use your most recent med list.          acetaminophen 325 MG tablet Commonly known as:  TYLENOL Take 650 mg by mouth every 6 (six) hours as needed for mild pain or headache.   albuterol 108 (90 Base) MCG/ACT inhaler Commonly known as:  PROVENTIL HFA;VENTOLIN HFA Inhale 2 puffs into the lungs every 6 (six) hours as needed. For cold symptoms   cholecalciferol 1000 units tablet Commonly known as:  VITAMIN D Take 1,000 Units by mouth daily.   divalproex 250 MG DR tablet Commonly known as:  DEPAKOTE Take 250 mg by mouth at bedtime. Take along with the 2 500 mg tablets   divalproex 500 MG DR tablet Commonly known as:  DEPAKOTE Take 2 tablet of 500 mg and 1 tablet of 250 mg, total of 1250 mg every night.   DULoxetine 30 MG capsule Commonly known as:  CYMBALTA Take 1 capsule (30 mg total) by mouth at bedtime.   ferrous sulfate 325 (65 FE) MG tablet Take 1 tablet (325 mg total) by mouth daily with breakfast.   folic acid 833 MCG tablet Commonly known as:  FOLVITE Take 800 mcg by mouth daily.   gabapentin 600 MG tablet Commonly known as:  NEURONTIN Take 600 mg by mouth 2 (two) times daily.   Insulin Glargine 300 UNIT/ML Sopn Commonly known as:  TOUJEO SOLOSTAR Inject 80 Units into the skin every morning. And pen needles 3/day.   Insulin Pen Needle 31G X 8 MM Misc Use to inject insulin 3 times per day.   levETIRAcetam 500 MG tablet Commonly known as:  KEPPRA Take 1 tablet (500 mg total) by mouth 2 (two) times daily. Please take for 7 days then stop   metFORMIN 1000 MG tablet Commonly known as:   GLUCOPHAGE Take 1 tablet (1,000 mg total) by mouth 2 (two) times daily with a meal.   omega-3 acid ethyl esters 1 g capsule Commonly known as:  LOVAZA Take 2 capsules (2 g total) by mouth 2 (two) times daily.   rosuvastatin 5 MG tablet Commonly known as:  CRESTOR Take 1 tablet (5 mg total) by mouth daily.   sennosides-docusate sodium 8.6-50 MG tablet Commonly known as:  SENOKOT-S Take 2 tablets by mouth at bedtime. Stop date 03/22/17       Immunizations: Immunization History  Administered Date(s) Administered  . Influenza Whole 10/07/2008, 10/25/2009, 01/11/2011  . PPD Test 03/18/2017  . Pneumococcal Polysaccharide-23 01/25/2007, 09/27/2015  . Td 11/27/1999     Physical Exam: Vitals:   03/21/17 1534  BP: 124/73  Pulse: 92  Resp: 18  Temp: 98.3 F (36.8 C)  TempSrc: Oral  SpO2: 96%  Weight: 249 lb (112.9 kg)  Height: 6\' 4"  (1.93 m)   Body mass  index is 30.31 kg/m.  General- adult male, obese, in no acute distress Head- normocephalic, atraumatic, normal neck ROM Nose- no maxillary or frontal sinus tenderness, no nasal discharge Throat- moist mucus membrane, normal oropharynx, poor dentition  Eyes- PERRLA, EOMI, no pallor, no icterus, no discharge, normal conjunctiva, normal sclera Neck- no cervical lymphadenopathy Cardiovascular- normal s1,s2, no murmur Respiratory- bilateral clear to auscultation Abdomen- bowel sounds present, soft, non tender Musculoskeletal- able to move all 4 extremities, generalized weakness, unsteady gait Neurological- alert and oriented to person, place and time, restring tremor to left hand Skin- warm and dry Psychiatry- normal mood and affect    Labs reviewed: Basic Metabolic Panel:  Recent Labs  10/13/16 0425 10/14/16 0521 03/14/17 1822 03/14/17 1836 03/15/17 0428  NA 136 134* 132* 131* 131*  K 4.0 3.9 4.2 4.3 3.8  CL 102 103 99* 98* 97*  CO2 24 22 23   --  22  GLUCOSE 225* 222* 201* 196* 154*  BUN 22* 20 11 11 8    CREATININE 1.14 1.08 0.87 0.80 0.81  CALCIUM 8.9 8.2* 9.3  --  9.0  MG 2.1  --   --   --   --   PHOS 3.4  --   --   --   --    Liver Function Tests:  Recent Labs  10/12/16 1530 10/13/16 0425 03/14/17 1822  AST 30 20 20   ALT 20 15* 12*  ALKPHOS 63 50 55  BILITOT 1.4* 1.1 0.9  PROT 7.9 6.3* 7.7  ALBUMIN 4.7 3.7 4.4    Recent Labs  03/14/17 1948  LIPASE 14    Recent Labs  10/12/16 1830  AMMONIA 21   CBC:  Recent Labs  10/12/16 1530  10/13/16 0425 10/14/16 0521 03/14/17 1822 03/14/17 1836 03/15/17 0428  WBC 8.6  --  10.4 6.8 10.0  --  7.1  NEUTROABS 5.7  --  6.1  --  8.2*  --   --   HGB 15.5  < > 13.2 12.7* 15.2 15.3 13.4  HCT 43.2  < > 37.9* 37.7* 42.0 45.0 37.5*  MCV 86.2  --  86.1 88.1 85.4  --  85.2  PLT 147*  --  150 122* 164  --  148*  < > = values in this interval not displayed. Cardiac Enzymes:  Recent Labs  03/14/17 1822  CKTOTAL 237   BNP: Invalid input(s): POCBNP CBG:  Recent Labs  03/17/17 2128 03/18/17 0628 03/18/17 1115  GLUCAP 129* 87 144*    Radiological Exams: Dg Forearm Left  Result Date: 03/14/2017 CLINICAL DATA:  Status post fall at home, with left forearm pain. Initial encounter. EXAM: LEFT FOREARM - 2 VIEW COMPARISON:  None. FINDINGS: There is no evidence of fracture or dislocation. The radius and ulna appear intact. The elbow joint is incompletely assessed, but appears grossly unremarkable. The carpal rows appear grossly intact, and demonstrate normal alignment. A peripheral IV catheter is noted overlying the mid forearm. There is medial soft tissue swelling at the elbow. IMPRESSION: No evidence of fracture or dislocation. Electronically Signed   By: Garald Balding M.D.   On: 03/14/2017 21:31   Ct Head Wo Contrast  Result Date: 03/15/2017 CLINICAL DATA:  Follow-up exam for subdural hemorrhage. EXAM: CT HEAD WITHOUT CONTRAST TECHNIQUE: Contiguous axial images were obtained from the base of the skull through the vertex  without intravenous contrast. COMPARISON:  Prior MRI from 03/14/2017. FINDINGS: Brain: Previously identified subdural hemorrhage overlying the right parietal convexity is grossly stable measuring  9 mm in maximal thickness. No significant mass effect. No evidence for interval expansion. No other acute intracranial hemorrhage. No evidence for acute large vessel territory infarct. No midline shift or hydrocephalus. No extra-axial fluid collection. Stable atrophy with chronic small vessel ischemic disease. Vascular: No hyperdense vessel. Skull: Scalp soft tissues demonstrate no acute abnormality. Calvarium intact. Sinuses/Orbits: Globes and oval soft tissues within normal limits. Scattered mucosal thickening within the ethmoidal air cells. Small retention cysts noted within the right maxillary sinus. Visualized paranasal sinuses are otherwise clear. No mastoid effusion. The IMPRESSION: 1. No significant interval change in small subdural hematoma overlying the right parietal convexity, measuring up to 9 mm on current exam. No significant mass effect. 2. No other new acute intracranial process. 3. Generalized cerebral atrophy with moderate chronic microvascular ischemic disease. Electronically Signed   By: Jeannine Boga M.D.   On: 03/15/2017 05:35   Mr Brain Wo Contrast  Result Date: 03/14/2017 CLINICAL DATA:  Found down status post fall EXAM: MRI HEAD WITHOUT CONTRAST TECHNIQUE: Multiplanar, multiecho pulse sequences of the brain and surrounding structures were obtained without intravenous contrast. COMPARISON:  Head CT 10/12/2016 FINDINGS: The examination had to be discontinued prior to completion due to patient inability to lie on his back and follow the technologist's instructions. Sagittal T1 weighted imaging and diffusion-weighted imaging with corresponding ADC maps were obtained. There is a 1 cm posterior right convexity subdural hematoma. The diffusion-weighted images are degraded by motion, but there  is no visible evidence of ischemia. IMPRESSION: 1. Truncated examination due to patient confusion and inability to lie flat. 2. Small, 1 cm subdural hematoma over the posterior right convexity. No associated midline shift. Critical Value/emergent results were called by telephone at the time of interpretation on 03/14/2017 at 7:02 pm to Dr. Orlie Dakin , who verbally acknowledged these results. Electronically Signed   By: Ulyses Jarred M.D.   On: 03/14/2017 19:06   Dg Shoulder Left  Result Date: 03/14/2017 CLINICAL DATA:  Status post fall at home, with left arm and shoulder pain. Initial encounter. EXAM: LEFT SHOULDER - 2+ VIEW COMPARISON:  None. FINDINGS: There is no evidence of fracture or dislocation. The left humeral head is seated within the glenoid fossa. Mild degenerative change is noted at the left acromioclavicular joint. No significant soft tissue abnormalities are seen. The visualized portions of the left lung are clear. IMPRESSION: No evidence of fracture or dislocation. Electronically Signed   By: Garald Balding M.D.   On: 03/14/2017 21:29   Dg Humerus Left  Result Date: 03/14/2017 CLINICAL DATA:  Status post fall at home, with left arm and shoulder pain. Initial encounter. EXAM: LEFT HUMERUS - 2+ VIEW COMPARISON:  None. FINDINGS: There is no evidence of fracture or dislocation. The left humerus appears intact. The elbow joint is grossly unremarkable, though incompletely assessed. The left humeral head remains seated at the glenoid fossa. No significant soft tissue abnormalities are characterized on radiograph. IMPRESSION: No evidence of fracture or dislocation. Electronically Signed   By: Garald Balding M.D.   On: 03/14/2017 21:30    Assessment/Plan  Unsteady gait Leading to fall yesterday. Likely multifactorial given his weakness, neuropathy and medication side effect. With his recent history of subdural hematoma, concerns for another bleed exists. get ct head without contrast to rule  out bleed. Fall precautions given his high fall risk. Continue to work with therapy team to help strengthen his gait.   Fall initial encounter No injury to head noted. Able to move all  4 extremities. No new focal weakness on exam. Will provide neurochecks for now with recent fall with head trauma  Subdural hematoma Stable on repeat ct head in hospital. With his recent fall, get repeat ct head to rule out bleed. Will need f/u with neurosurgery in 4 weeks. Continue keppra 500 mg bid x 1 week for seizure prophylaxis. Hold aspirin for now until f/u with neurosurgery  Dizziness Not on any antihypertensive. On high dose depakote for behavior issues and this could contribute to his dizziness. Decrease his depakote to 1000 mg daily and monitor. Decrease dosing further if symptom persists. Check orthostatics.  Headache No neurological deficit on exam. Likely post trauma. Rebound headache from long term use of NSAIDs is another possibility. Being on keppra and depakote can also contribute to this. No visual problem this visit. No signs of infection on clinical exam. Currently on tylenol 650 mg q6h prn. Avoid NSAIDs given his recent hematoma. Ct head to rule out bleed. BP stable. Monitor vital signs. Change tylenol to 1000 mg po bid x 3 days and monitor  Somnolence Currently aaox 3. Possible side effect of keppra, depakote high dose, deconditioning among others with recent fall. Will rule out intracranial bleed. neurochecks x 48 hrs for now   Labs/tests ordered: cbc, cmp 03/22/17   Family/ staff Communication: reviewed care plan with patient and nursing supervisor    Blanchie Serve, MD Internal Medicine [place],  [id number] Cell Phone (Monday-Friday 8 am - 5 pm): [phone] On Call: [phone] and follow prompts after 5 pm and on weekends Office Phone: [phone] Office Fax: [phone]

## 2017-03-22 ENCOUNTER — Ambulatory Visit
Admission: RE | Admit: 2017-03-22 | Discharge: 2017-03-22 | Disposition: A | Discharge status: Home / Self Care | Payer: Medicare HMO | Source: Ambulatory Visit | Attending: Internal Medicine | Admitting: Internal Medicine

## 2017-03-22 DIAGNOSIS — S065X9A Traumatic subdural hemorrhage with loss of consciousness of unspecified duration, initial encounter: Secondary | ICD-10-CM

## 2017-03-22 DIAGNOSIS — S065XAA Traumatic subdural hemorrhage with loss of consciousness status unknown, initial encounter: Secondary | ICD-10-CM

## 2017-03-22 LAB — BASIC METABOLIC PANEL
BUN: 14 mg/dL (ref 4–21)
Creatinine: 0.8 mg/dL (ref 0.6–1.3)
Glucose: 56 mg/dL
Potassium: 4.4 mmol/L (ref 3.4–5.3)
SODIUM: 134 mmol/L — AB (ref 137–147)

## 2017-03-22 LAB — HEPATIC FUNCTION PANEL
ALK PHOS: 47 U/L (ref 25–125)
ALT: 11 U/L (ref 10–40)
AST: 19 U/L (ref 14–40)
BILIRUBIN, TOTAL: 0.5 mg/dL

## 2017-03-22 LAB — CBC AND DIFFERENTIAL
HEMATOCRIT: 40 % — AB (ref 41–53)
HEMOGLOBIN: 14 g/dL (ref 13.5–17.5)
Platelets: 177 10*3/uL (ref 150–399)
WBC: 10.7 10^3/mL

## 2017-03-25 ENCOUNTER — Non-Acute Institutional Stay (SKILLED_NURSING_FACILITY): Payer: Medicare HMO | Admitting: Family

## 2017-03-25 ENCOUNTER — Encounter: Payer: Self-pay | Admitting: Family

## 2017-03-25 ENCOUNTER — Emergency Department (HOSPITAL_COMMUNITY)
Admission: EM | Admit: 2017-03-25 | Discharge: 2017-03-26 | Disposition: A | Discharge status: Home / Self Care | Payer: Medicare HMO | Attending: Emergency Medicine | Admitting: Emergency Medicine

## 2017-03-25 ENCOUNTER — Emergency Department (HOSPITAL_COMMUNITY): Payer: Medicare HMO

## 2017-03-25 ENCOUNTER — Encounter (HOSPITAL_COMMUNITY): Payer: Self-pay

## 2017-03-25 DIAGNOSIS — R079 Chest pain, unspecified: Secondary | ICD-10-CM | POA: Diagnosis not present

## 2017-03-25 DIAGNOSIS — E11649 Type 2 diabetes mellitus with hypoglycemia without coma: Secondary | ICD-10-CM | POA: Diagnosis present

## 2017-03-25 DIAGNOSIS — Z794 Long term (current) use of insulin: Secondary | ICD-10-CM | POA: Insufficient documentation

## 2017-03-25 DIAGNOSIS — E1140 Type 2 diabetes mellitus with diabetic neuropathy, unspecified: Secondary | ICD-10-CM | POA: Diagnosis not present

## 2017-03-25 DIAGNOSIS — E1142 Type 2 diabetes mellitus with diabetic polyneuropathy: Secondary | ICD-10-CM

## 2017-03-25 DIAGNOSIS — I10 Essential (primary) hypertension: Secondary | ICD-10-CM | POA: Insufficient documentation

## 2017-03-25 DIAGNOSIS — E0840 Diabetes mellitus due to underlying condition with diabetic neuropathy, unspecified: Secondary | ICD-10-CM

## 2017-03-25 DIAGNOSIS — F17290 Nicotine dependence, other tobacco product, uncomplicated: Secondary | ICD-10-CM | POA: Insufficient documentation

## 2017-03-25 DIAGNOSIS — E162 Hypoglycemia, unspecified: Secondary | ICD-10-CM

## 2017-03-25 LAB — URINALYSIS, ROUTINE W REFLEX MICROSCOPIC
Bacteria, UA: NONE SEEN
Bilirubin Urine: NEGATIVE
Glucose, UA: NEGATIVE mg/dL
Hgb urine dipstick: NEGATIVE
KETONES UR: 5 mg/dL — AB
Leukocytes, UA: NEGATIVE
Nitrite: NEGATIVE
PH: 6 (ref 5.0–8.0)
PROTEIN: 30 mg/dL — AB
Specific Gravity, Urine: 1.019 (ref 1.005–1.030)

## 2017-03-25 LAB — CBC WITH DIFFERENTIAL/PLATELET
BASOS PCT: 0 %
Basophils Absolute: 0 10*3/uL (ref 0.0–0.1)
EOS ABS: 0 10*3/uL (ref 0.0–0.7)
EOS PCT: 0 %
HCT: 38.5 % — ABNORMAL LOW (ref 39.0–52.0)
HEMOGLOBIN: 13 g/dL (ref 13.0–17.0)
Lymphocytes Relative: 28 %
Lymphs Abs: 2 10*3/uL (ref 0.7–4.0)
MCH: 29.5 pg (ref 26.0–34.0)
MCHC: 33.8 g/dL (ref 30.0–36.0)
MCV: 87.3 fL (ref 78.0–100.0)
MONO ABS: 0.5 10*3/uL (ref 0.1–1.0)
MONOS PCT: 7 %
NEUTROS PCT: 65 %
Neutro Abs: 4.7 10*3/uL (ref 1.7–7.7)
Platelets: 175 10*3/uL (ref 150–400)
RBC: 4.41 MIL/uL (ref 4.22–5.81)
RDW: 12.4 % (ref 11.5–15.5)
WBC: 7.2 10*3/uL (ref 4.0–10.5)

## 2017-03-25 LAB — CBC AND DIFFERENTIAL
HCT: 41 % (ref 41–53)
Hemoglobin: 14.1 g/dL (ref 13.5–17.5)
PLATELETS: 191 10*3/uL (ref 150–399)
WBC: 9.9 10^3/mL

## 2017-03-25 LAB — COMPREHENSIVE METABOLIC PANEL
ALK PHOS: 50 U/L (ref 38–126)
ALT: 14 U/L — AB (ref 17–63)
AST: 26 U/L (ref 15–41)
Albumin: 3.6 g/dL (ref 3.5–5.0)
Anion gap: 12 (ref 5–15)
BUN: 19 mg/dL (ref 6–20)
CALCIUM: 8.8 mg/dL — AB (ref 8.9–10.3)
CO2: 24 mmol/L (ref 22–32)
CREATININE: 0.89 mg/dL (ref 0.61–1.24)
Chloride: 94 mmol/L — ABNORMAL LOW (ref 101–111)
Glucose, Bld: 72 mg/dL (ref 65–99)
Potassium: 4.5 mmol/L (ref 3.5–5.1)
Sodium: 130 mmol/L — ABNORMAL LOW (ref 135–145)
Total Bilirubin: 0.7 mg/dL (ref 0.3–1.2)
Total Protein: 6.8 g/dL (ref 6.5–8.1)

## 2017-03-25 LAB — CBG MONITORING, ED
GLUCOSE-CAPILLARY: 76 mg/dL (ref 65–99)
Glucose-Capillary: 86 mg/dL (ref 65–99)

## 2017-03-25 LAB — BASIC METABOLIC PANEL
BUN: 17 mg/dL (ref 4–21)
Creatinine: 0.8 mg/dL (ref 0.6–1.3)
Glucose: 25 mg/dL
Potassium: 4.4 mmol/L (ref 3.4–5.3)
Sodium: 134 mmol/L — AB (ref 137–147)

## 2017-03-25 LAB — TROPONIN I: TROPONIN I: 0.03 ng/mL — AB (ref <0.03)

## 2017-03-25 LAB — ETHANOL

## 2017-03-25 LAB — LIPASE, BLOOD: LIPASE: 23 U/L (ref 11–51)

## 2017-03-25 NOTE — ED Notes (Signed)
Patient transported to X-ray 

## 2017-03-25 NOTE — ED Provider Notes (Signed)
[facility] DEPT Provider Note   CSN: [id number] Arrival date & time: 03/25/17  2124     History   Chief Complaint Chief Complaint  Patient presents with  . Hypoglycemia  . Chest Pain    HPI Caleb Joseph is a 61 y.o. male.  He reports that he is here for lower chest pain, anterior, bilateral, for several weeks.  He feels that it is related to his "hiatal hernia".  He has early satiety, ongoing.  He denies nausea, vomiting, fever, chills, weakness or dizziness.  He was recently admitted with a subdural, and subsequently had a second fall, 4 days ago, at which time he had a CT which showed resolution of the initial subdural hematoma.  He is currently in rehab.  On EMSs arrival at his facility, he was found to be hypoglycemic "in the 40s".  He was treated with 25 cc of D10, with improvement of CBG to 253.  There are no other known modifying factors.     HPI  Past Medical History:  Diagnosis Date  . Abscess of prostate 02/17/2009  . Diabetes mellitus   . Diabetic foot infection ([place]) 12/26/2011  . 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 HYPERLIPIDEMIA 08/26/2007  . Hypertension   . 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 Subdural hematoma ([place]) 03/15/2017  . Fall 03/14/2017  . Tobacco abuse 03/14/2017  . SDH (subdural hematoma) ([place]) 03/14/2017  . Acute encephalopathy 03/14/2017  . GERD (gastroesophageal reflux disease) 03/14/2017  . Acute metabolic encephalopathy 38/08/1750  . Altered mental status, unspecified 10/12/2016  . Encephalopathy 10/12/2016  . DKA (diabetic ketoacidoses) ([place]) 09/25/2015  . Type 2 diabetes mellitus ([place]) 09/25/2015  . Hyponatremia 09/25/2015  . Lactic acidosis 09/25/2015  . Dysplastic nevus of skin 03/17/2013  . Hx of noncompliance with medical treatment, presenting hazards to health 12/16/2012  . ULNAR NEUROPATHY, RIGHT 06/02/2010  . Other specified disorder of  male genital organs(608.89) 11/22/2009  . ELECTROCARDIOGRAM, ABNORMAL 11/22/2009  . PVD 07/19/2009  . DYSPNEA 07/19/2009  . 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 ANEMIA, VITAMIN B12 DEFICIENCY 07/17/2008  . DENTAL CARIES 07/07/2008  . Depression 05/11/2008  . Diabetic neuropathy ([place]) 10/02/2007  . Diabetes mellitus without complication ([place]) 02/58/5277  . OLIGOSPERMIA 10/02/2007  . INGUINAL PAIN, RIGHT 10/02/2007  . Dyslipidemia 08/26/2007  . Essential hypertension 08/26/2007    Past Surgical History:  Procedure Laterality Date  . AMPUTATION  12/30/2011   Procedure: AMPUTATION DIGIT;  Surgeon: Marybelle Killings, MD;  Location: [facility];  Service: Orthopedics;  Laterality: Left;  Left Great  . AMPUTATION  03/20/2012   Procedure: AMPUTATION DIGIT;  Surgeon: Mcarthur Rossetti, MD;  Location: [facility];  Service: Orthopedics;  Laterality: Left;  Left foot 3rd toe amputation  . CARDIAC CATHETERIZATION    . I&D EXTREMITY Left 09/28/2015   Procedure: IRRIGATION AND DRAINAGE OF LEFT FOOT with second and third metatarsal amputation;  Surgeon: Mcarthur Rossetti, MD;  Location: [facility];  Service: Orthopedics;  Laterality: Left;  . TOE AMPUTATION  08/2009   R little toe       Home Medications    Prior to Admission medications   Medication Sig Start Date End Date Taking? Authorizing Provider  albuterol (PROVENTIL HFA;VENTOLIN HFA) 108 (90 BASE) MCG/ACT inhaler Inhale 2 puffs into the lungs every 6 (six) hours as needed. For cold symptoms 12/16/12   Gerda Diss, DO  cholecalciferol (VITAMIN D)  1000 UNITS tablet Take 1,000 Units by mouth daily. 06/23/15   Historical Provider, MD  divalproex (DEPAKOTE) 500 MG DR tablet Take 1,000 mg by mouth at bedtime.    Historical Provider, MD  DULoxetine (CYMBALTA) 30 MG capsule Take 1 capsule (30 mg total) by mouth at bedtime. 12/16/12   Gerda Diss, DO  ferrous sulfate 325 (65 FE) MG tablet Take 1 tablet (325 mg total) by mouth daily with  breakfast. 10/15/16   Lavina Hamman, MD  folic acid (FOLVITE) 235 MCG tablet Take 800 mcg by mouth daily.     Historical Provider, MD  gabapentin (NEURONTIN) 600 MG tablet Take 600 mg by mouth 2 (two) times daily. 01/28/17   Historical Provider, MD  Insulin Glargine (TOUJEO SOLOSTAR) 300 UNIT/ML SOPN Inject 80 Units into the skin every morning. And pen needles 3/day. 03/15/17   Silver Huguenin Elgergawy, MD  levETIRAcetam (KEPPRA) 500 MG tablet Take 1 tablet (500 mg total) by mouth 2 (two) times daily. Please take for 7 days then stop 03/15/17 03/22/17  Albertine Patricia, MD  metFORMIN (GLUCOPHAGE) 1000 MG tablet Take 1 tablet (1,000 mg total) by mouth 2 (two) times daily with a meal. 04/22/13   Gerda Diss, DO  omega-3 acid ethyl esters (LOVAZA) 1 G capsule Take 2 capsules (2 g total) by mouth 2 (two) times daily. 09/14/13   Gerda Diss, DO  rosuvastatin (CRESTOR) 5 MG tablet Take 1 tablet (5 mg total) by mouth daily. 03/17/13   Gerda Diss, DO    Family History Family History  Problem Relation Age of Onset  . Diabetes Mother     Social History Social History  Substance Use Topics  . Smoking status: Current Some Day Smoker    Types: Cigars, Pipe  . Smokeless tobacco: Current User    Types: Chew, Snuff     Comment: occasional chew and cigarette, pipe, cigar user  . Alcohol use Yes     Comment: occasional     Allergies   Lisinopril and Losartan   Review of Systems Review of Systems  All other systems reviewed and are negative.    Physical Exam Updated Vital Signs BP 116/72   Pulse 85   Temp 98.6 F (37 C) (Oral)   Resp 14   Wt 249 lb (112.9 kg)   SpO2 100%   BMI 30.31 kg/m   Physical Exam  Constitutional: He is oriented to person, place, and time. He appears well-developed and well-nourished. No distress.  HENT:  Head: Normocephalic and atraumatic.  Right Ear: External ear normal.  Left Ear: External ear normal.  Eyes: Conjunctivae and EOM are normal. Pupils  are equal, round, and reactive to light.  Neck: Normal range of motion and phonation normal. Neck supple.  Cardiovascular: Normal rate, regular rhythm and normal heart sounds.   Pulmonary/Chest: Effort normal and breath sounds normal. No respiratory distress. He exhibits tenderness (Anterior, lower, mild, no associated crepitation). He exhibits no bony tenderness.  Abdominal: Soft. There is no tenderness.  Musculoskeletal: Normal range of motion.  Neurological: He is alert and oriented to person, place, and time. No cranial nerve deficit or sensory deficit. He exhibits normal muscle tone. Coordination normal.  Skin: Skin is warm, dry and intact.  Psychiatric: His behavior is normal. Judgment and thought content normal.  Mildly anxious  Nursing note and vitals reviewed.    ED Treatments / Results  Labs (all labs ordered are listed, but only abnormal results are displayed) Labs  Reviewed  COMPREHENSIVE METABOLIC PANEL - Abnormal; Notable for the following:       Result Value   Sodium 130 (*)    Chloride 94 (*)    Calcium 8.8 (*)    ALT 14 (*)    All other components within normal limits  URINALYSIS, ROUTINE W REFLEX MICROSCOPIC - Abnormal; Notable for the following:    Ketones, ur 5 (*)    Protein, ur 30 (*)    Squamous Epithelial / LPF 0-5 (*)    All other components within normal limits  TROPONIN I - Abnormal; Notable for the following:    Troponin I 0.03 (*)    All other components within normal limits  ETHANOL  LIPASE, BLOOD  CBC WITH DIFFERENTIAL/PLATELET  CBC WITH DIFFERENTIAL/PLATELET  CBG MONITORING, ED  CBG MONITORING, ED    EKG  EKG Interpretation  Date/Time:  Monday March 25 2017 21:44:48 EDT Ventricular Rate:  90 PR Interval:    QRS Duration: 131 QT Interval:  370 QTC Calculation: 453 R Axis:   -74 Text Interpretation:  Sinus rhythm Consider left atrial enlargement RBBB and LAFB Minimal ST elevation, anterior leads since last tracing no significant change  Confirmed by Eulis Foster  MD, Leilanny Fluitt [phone]) on 03/25/2017 10:25:18 PM       Radiology Dg Chest 2 View  Result Date: 03/25/2017 CLINICAL DATA:  Central chest pain for 2-3 days EXAM: CHEST  2 VIEW COMPARISON:  10/12/2016 FINDINGS: The heart size and mediastinal contours are within normal limits. Both lungs are clear. The visualized skeletal structures are unremarkable. IMPRESSION: No active cardiopulmonary disease. Electronically Signed   By: Donavan Foil M.D.   On: 03/25/2017 22:24    Procedures Procedures (including critical care time)  Medications Ordered in ED Medications - No data to display   Initial Impression / Assessment and Plan / ED Course  I have reviewed the triage vital signs and the nursing notes.  Pertinent labs & imaging results that were available during my care of the patient were reviewed by me and considered in my medical decision making (see chart for details).     Medications - No data to display  Patient Vitals for the past 24 hrs:  BP Temp Temp src Pulse Resp SpO2 Weight  03/25/17 2330 116/72 - - 85 14 100 % -  03/25/17 2200 134/86 - - 89 16 100 % -  03/25/17 2145 (!) 144/86 - - 88 18 98 % -  03/25/17 2144 138/85 98.6 F (37 C) Oral 90 18 97 % -  03/25/17 2137 - - - - - - 249 lb (112.9 kg)    11:05 PM Reevaluation with update and discussion. After initial assessment and treatment, an updated evaluation reveals he is hungry, calm and cooperative.  No additional complaints.  Patient updated on findings and plan. Lizett Chowning L    Final Clinical Impressions(s) / ED Diagnoses   Final diagnoses:  Hypoglycemia  Nonspecific chest pain   Nonspecific chronic chest pain, with an episode of hypoglycemia.  Oral intake decreased likely secondary to hiatal hernia.  Chest pain has been present for 3 weeks, unchanged.  Doubt ACS.  No evidence for serious bacterial infection, metabolic instability or impending vascular collapse.  Nursing Notes Reviewed/ Care  Coordinated Applicable Imaging Reviewed Interpretation of Laboratory Data incorporated into ED treatment   Repeat troponin I, and 01:10 hours.  Disposition following.  Return to rehab if no elevation of troponin.   New Prescriptions New Prescriptions   No medications  on file     Daleen Bo, MD 03/25/17 [phone]

## 2017-03-25 NOTE — ED Notes (Signed)
Pt provided with turkey sandwich and diet ginger ale 

## 2017-03-25 NOTE — Progress Notes (Signed)
Location:  [facility] Room Number: [place] of Service:  SNF [phone]) Provider: Magdelena Kinsella FNP-C  Chesley Noon, MD  Patient Care Team: Chesley Noon, MD as PCP - General (Family Medicine) Chesley Noon, MD as Referring Physician [facility])  Extended Emergency Contact Information Primary Emergency Contact: [facility] Address: [address]          [place], [place] [address] [place] Phone: [phone] Relation: Brother  Code Status:  Full Code  Goals of care: Advanced Directive information Advanced Directives 03/25/2017  Does Patient Have a Medical Advance Directive? No  Would patient like information on creating a medical advance directive? No - Patient declined  Pre-existing out of facility DNR order (yellow form or pink MOST form) -     Chief Complaint  Patient presents with  . Acute Visit    Low blood sugars    HPI:  Pt is a 61 y.o. male seen today at [facility] for an acute visit for evaluation of low blood sugars.He has a significant medical history of Type 2 DM, HTN, Hyperlipidemia, Peripheral Neuropathy among other conditions.He is seen in his room today. He denies any acute issues this visit. Facility staff reports patient's blood sugars drop to 40's Glucagon and orange juices given. Rechecked blood sugars per facility Nurse now is 120. Facility blood sugar log reviewed CBG's ranging in the low 70's -180. He states has  Poor appetite cannot eat his meals. Facility Nurse records indicate patient has poor appetite and eats 25 % of the meals at times.    Past Medical History:  Diagnosis Date  . Abscess of prostate 02/17/2009  . Diabetes mellitus   . Diabetic foot infection ([place]) 12/26/2011  . 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 HYPERLIPIDEMIA 08/26/2007  . Hypertension   . 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 AMPUTATION  12/30/2011   Procedure: AMPUTATION DIGIT;  Surgeon: Marybelle Killings, MD;  Location: [facility];  Service: Orthopedics;  Laterality: Left;  Left Great  . AMPUTATION  03/20/2012   Procedure: AMPUTATION DIGIT;  Surgeon: Mcarthur Rossetti, MD;  Location: [facility];  Service: Orthopedics;  Laterality: Left;  Left foot 3rd toe amputation  . CARDIAC CATHETERIZATION    . I&D EXTREMITY Left 09/28/2015   Procedure: IRRIGATION AND DRAINAGE OF LEFT FOOT with second and third metatarsal amputation;  Surgeon: Mcarthur Rossetti, MD;  Location: [facility];  Service: Orthopedics;  Laterality: Left;  . TOE AMPUTATION  08/2009   R little toe    Allergies  Allergen Reactions  . Lisinopril Other (See Comments)    Caused upper arm and shoulder weakness  . Losartan Other (See Comments)    Caused upper arm and shoulder weakness    Allergies as of 03/25/2017      Reactions   Lisinopril Other (See Comments)   Caused upper arm and shoulder weakness   Losartan Other (See Comments)   Caused upper arm and shoulder weakness      Medication List       Accurate as of 03/25/17  3:01 PM. Always use your most recent med list.          albuterol 108 (90 Base) MCG/ACT inhaler Commonly known as:  PROVENTIL HFA;VENTOLIN HFA Inhale 2 puffs into the lungs every 6 (six) hours as needed. For cold symptoms   cholecalciferol 1000 units tablet Commonly  known as:  VITAMIN D Take 1,000 Units by mouth daily.   divalproex 500 MG DR tablet Commonly known as:  DEPAKOTE Take 1,000 mg by mouth at bedtime.   DULoxetine 30 MG capsule Commonly known as:  CYMBALTA Take 1 capsule (30 mg total) by mouth at bedtime.   ferrous sulfate 325 (65 FE) MG tablet Take 1 tablet (325 mg total) by mouth daily with breakfast.   folic acid 124 MCG tablet Commonly known as:  FOLVITE Take 800 mcg by mouth daily.   gabapentin 600 MG tablet Commonly known as:  NEURONTIN Take 600 mg by mouth 2 (two) times daily.     Insulin Glargine 300 UNIT/ML Sopn Commonly known as:  TOUJEO SOLOSTAR Inject 80 Units into the skin every morning. And pen needles 3/day.   levETIRAcetam 500 MG tablet Commonly known as:  KEPPRA Take 1 tablet (500 mg total) by mouth 2 (two) times daily. Please take for 7 days then stop   metFORMIN 1000 MG tablet Commonly known as:  GLUCOPHAGE Take 1 tablet (1,000 mg total) by mouth 2 (two) times daily with a meal.   omega-3 acid ethyl esters 1 g capsule Commonly known as:  LOVAZA Take 2 capsules (2 g total) by mouth 2 (two) times daily.   rosuvastatin 5 MG tablet Commonly known as:  CRESTOR Take 1 tablet (5 mg total) by mouth daily.       Review of Systems  Constitutional: Positive for appetite change. Negative for activity change, chills, fatigue and fever.  HENT: Negative for congestion, rhinorrhea, sinus pain, sinus pressure, sneezing and sore throat.   Eyes: Negative.   Respiratory: Negative for cough, chest tightness, shortness of breath and wheezing.   Cardiovascular: Negative for chest pain, palpitations and leg swelling.  Gastrointestinal: Negative for abdominal distention, abdominal pain, constipation, diarrhea, nausea and vomiting.  Endocrine: Negative for cold intolerance, heat intolerance, polydipsia, polyphagia and polyuria.  Genitourinary: Negative for dysuria, flank pain, frequency and urgency.  Musculoskeletal: Positive for gait problem.  Skin: Negative for color change, pallor and rash.  Neurological: Negative for dizziness, seizures, syncope, light-headedness and headaches.  Psychiatric/Behavioral: Negative for agitation, confusion, hallucinations and sleep disturbance. The patient is not nervous/anxious.     Immunization History  Administered Date(s) Administered  . Influenza Whole 10/07/2008, 10/25/2009, 01/11/2011  . PPD Test 03/18/2017  . Pneumococcal Polysaccharide-23 01/25/2007, 09/27/2015  . Td 11/27/1999   Pertinent  Health Maintenance Due   Topic Date Due  . OPHTHALMOLOGY EXAM  11/22/1965  . COLONOSCOPY  11/22/2005  . URINE MICROALBUMIN  04/22/2014  . INFLUENZA VACCINE  06/26/2017  . HEMOGLOBIN A1C  09/05/2017  . FOOT EXAM  03/06/2018   Fall Risk  02/02/2015 10/05/2013 08/03/2013 07/01/2013  Falls in the past year? Yes No No No  Risk for fall due to : Impaired mobility;Medication side effect - - -  Risk for fall due to (comments): Has cane around the house. has had several toes amputated and is unsteady. has low BG which contibutes to unsteadiness - - -    Vitals:   03/25/17 1142  BP: 133/90  Pulse: 95  Resp: 20  Temp: 98 F (36.7 C)  TempSrc: Oral  SpO2: 98%  Weight: 249 lb (112.9 kg)  Height: 6\' 4"  (1.93 m)   Body mass index is 30.31 kg/m. Physical Exam  Labs reviewed:  Recent Labs  10/13/16 0425 10/14/16 0521 03/14/17 1822 03/14/17 1836 03/15/17 0428 03/22/17  NA 136 134* 132* 131* 131* 134*  K 4.0  3.9 4.2 4.3 3.8 4.4  CL 102 103 99* 98* 97*  --   CO2 24 22 23   --  22  --   GLUCOSE 225* 222* 201* 196* 154*  --   BUN 22* 20 11 11 8 14   CREATININE 1.14 1.08 0.87 0.80 0.81 0.8  CALCIUM 8.9 8.2* 9.3  --  9.0  --   MG 2.1  --   --   --   --   --   PHOS 3.4  --   --   --   --   --     Recent Labs  10/12/16 1530 10/13/16 0425 03/14/17 1822 03/22/17  AST 30 20 20 19   ALT 20 15* 12* 11  ALKPHOS 63 50 55 47  BILITOT 1.4* 1.1 0.9  --   PROT 7.9 6.3* 7.7  --   ALBUMIN 4.7 3.7 4.4  --     Recent Labs  10/12/16 1530  10/13/16 0425 10/14/16 0521 03/14/17 1822 03/14/17 1836 03/15/17 0428 03/22/17  WBC 8.6  --  10.4 6.8 10.0  --  7.1 10.7  NEUTROABS 5.7  --  6.1  --  8.2*  --   --   --   HGB 15.5  < > 13.2 12.7* 15.2 15.3 13.4 14.0  HCT 43.2  < > 37.9* 37.7* 42.0 45.0 37.5* 40*  MCV 86.2  --  86.1 88.1 85.4  --  85.2  --   PLT 147*  --  150 122* 164  --  148* 177  < > = values in this interval not displayed. Lab Results  Component Value Date   TSH 1.446 06/15/2011   Lab Results   Component Value Date   HGBA1C 11.2 03/06/2017   Lab Results  Component Value Date   CHOL 105 08/03/2013   HDL 31 (L) 08/03/2013   LDLCALC 24 08/03/2013   TRIG 252 (H) 08/03/2013   CHOLHDL 3.4 08/03/2013   Assessment/Plan  Diabetes mellitus due to underlying condition with diabetic neuropathy, with long-term current use of insulin  CBG in the 40's prior to visit. No hypoglycemic symptoms. Reviewed facility CBG's Log ranging in the 70's-180's. His poor appetite could be contributing to his hypoglycemia. Currently on Toujeo 80 units daily and Metformin 1000 mg tablet twice daily. Discontinue metformin 1000 mg tablet and reduce Toujeo to 60 units SQ daily.Hold if he does not consume > 25 % of meals.RD consult for supplements.continue to monitor. Recheck Hgb A1C in 3 months.   Family/ staff Communication: Reviewed plan of care with patient and facility Nurse supervisor  Labs/tests ordered: Hgb A1C in 3 months Sandrea Hughs, NP

## 2017-03-25 NOTE — ED Triage Notes (Signed)
Pt from [facility] c/o hypoglycemia and Chest Pain that comes while lying down. Pt was seen last Thursday for a fall and possible TBI. Pt also diagnosed with hiatal hernia. Pts CBg upon EMS arrival was in the 40s, pt given 25 D10 thru 20G in R Hand. CBG now reads 253. Pt sinus rhythm on the monitor in 90s BP 160/100. 99% on room air. Pt alert and oriented to person and place. Disoriented to time. NAD at this time

## 2017-03-25 NOTE — ED Provider Notes (Signed)
  Physical Exam  BP 116/72   Pulse 85   Temp 98.6 F (37 C) (Oral)   Resp 14   Wt 112.9 kg   SpO2 100%   BMI 30.31 kg/m   Physical Exam  ED Course  Procedures  MDM 11:40 PM- Sign out from Dr. Eulis Foster  Per previous provider MDM  Nonspecific chronic chest pain, with an episode of hypoglycemia.  Oral intake decreased likely secondary to hiatal hernia.  No evidence for serious bacterial infection, metabolic instability or impending vascular collapse.  Nursing Notes Reviewed/ Care Coordinated Applicable Imaging Reviewed Interpretation of Laboratory Data incorporated into ED treatment  The patient appears reasonably screened and/or stabilized for discharge and I doubt any other medical condition or other [facility] requiring further screening, evaluation, or treatment in the ED at this time prior to discharge.  Plan: Home Medications-continue usual medications; Home Treatments-try to increase dietary intake of food; return here if the recommended treatment, does not improve the symptoms; Recommended follow up-PCP checkup tomorrow, and as needed  2:29 AM- repeat Troponin negative. Discussed with patient. OK for DC. At time of discharge, Patient is in no acute distress. Vital Signs are stable. Patient is able to ambulate. Patient able to tolerate PO.          Shary Decamp, PA-C 03/26/17 2119    Daleen Bo, MD 03/28/17 [phone]

## 2017-03-26 LAB — TROPONIN I: Troponin I: <0.03 ng/mL (ref <0.03)

## 2017-03-26 NOTE — ED Notes (Signed)
Pt provided with black decaf coffee and a graham cracker

## 2017-03-26 NOTE — ED Notes (Addendum)
Called [facility] and gave report to one of the nurses.

## 2017-03-26 NOTE — Discharge Instructions (Signed)
Please read and follow all provided instructions.  Your diagnoses today include:  1. Hypoglycemia   2. Nonspecific chest pain     Tests performed today include: An EKG of your heart A chest x-ray Cardiac enzymes - a blood test for heart muscle damage Blood counts and electrolytes Vital signs. See below for your results today.   Medications prescribed:   Take any prescribed medications only as directed.  Follow-up instructions: Please follow-up with your primary care provider as soon as you can for further evaluation of your symptoms.   Return instructions:  SEEK IMMEDIATE MEDICAL ATTENTION IF: You have severe chest pain, especially if the pain is crushing or pressure-like and spreads to the arms, back, neck, or jaw, or if you have sweating, nausea (feeling sick to your stomach), or shortness of breath. THIS IS AN EMERGENCY. Don't wait to see if the pain will go away. Get medical help at once. Call 911 or 0 (operator). DO NOT drive yourself to the hospital.  Your chest pain gets worse and does not go away with rest.  You have an attack of chest pain lasting longer than usual, despite rest and treatment with the medications your caregiver has prescribed.  You wake from sleep with chest pain or shortness of breath. You feel dizzy or faint. You have chest pain not typical of your usual pain for which you originally saw your caregiver.  You have any other emergent concerns regarding your health.  Additional Information: Chest pain comes from many different causes. Your caregiver has diagnosed you as having chest pain that is not specific for one problem, but does not require admission.  You are at low risk for an acute heart condition or other serious illness.   Your vital signs today were: BP 103/63    Pulse 91    Temp 98.6 F (37 C) (Oral)    Resp 12    Wt 112.9 kg    SpO2 95%    BMI 30.31 kg/m  If your blood pressure (BP) was elevated above 135/85 this visit, please have this  repeated by your doctor within one month. --------------

## 2017-03-26 NOTE — ED Notes (Signed)
[facility] called for transport.  

## 2017-03-28 ENCOUNTER — Ambulatory Visit: Payer: Medicare HMO | Admitting: Podiatry

## 2017-04-05 ENCOUNTER — Ambulatory Visit: Payer: Medicare HMO | Admitting: Endocrinology

## 2017-04-09 ENCOUNTER — Encounter: Payer: Self-pay | Admitting: Family

## 2017-04-09 ENCOUNTER — Non-Acute Institutional Stay (SKILLED_NURSING_FACILITY): Payer: Medicare HMO | Admitting: Family

## 2017-04-09 DIAGNOSIS — K219 Gastro-esophageal reflux disease without esophagitis: Secondary | ICD-10-CM | POA: Diagnosis not present

## 2017-04-09 DIAGNOSIS — R2681 Unsteadiness on feet: Secondary | ICD-10-CM | POA: Diagnosis not present

## 2017-04-09 DIAGNOSIS — D518 Other vitamin B12 deficiency anemias: Secondary | ICD-10-CM

## 2017-04-09 DIAGNOSIS — F329 Major depressive disorder, single episode, unspecified: Secondary | ICD-10-CM | POA: Diagnosis not present

## 2017-04-09 DIAGNOSIS — E785 Hyperlipidemia, unspecified: Secondary | ICD-10-CM

## 2017-04-09 DIAGNOSIS — F32A Depression, unspecified: Secondary | ICD-10-CM

## 2017-04-09 DIAGNOSIS — K5901 Slow transit constipation: Secondary | ICD-10-CM

## 2017-04-09 DIAGNOSIS — Z794 Long term (current) use of insulin: Secondary | ICD-10-CM

## 2017-04-09 DIAGNOSIS — E0840 Diabetes mellitus due to underlying condition with diabetic neuropathy, unspecified: Secondary | ICD-10-CM

## 2017-04-09 DIAGNOSIS — E559 Vitamin D deficiency, unspecified: Secondary | ICD-10-CM | POA: Diagnosis not present

## 2017-04-10 NOTE — Progress Notes (Signed)
Location:  [name] Room Number: [phone] Place of Service:  SNF [phone])  Provider: Marlowe Sax FNP-C   PCP: Chesley Noon, MD Patient Care Team: Chesley Noon, MD as PCP - General (Family Medicine) Chesley Noon, MD as Referring Physician [facility] Medicine)  Extended Emergency Contact Information Primary Emergency Contact: Cecil Address: [address]          [place], [place] [address] [name] of [place] Phone: [phone] Relation: Brother  Code Status: Full Code   Goals of care:  Advanced Directive information Advanced Directives 04/09/2017  Does Patient Have a Medical Advance Directive? No  Would patient like information on creating a medical advance directive? -  Pre-existing out of facility DNR order (yellow form or pink MOST form) -     Allergies  Allergen Reactions  . Lisinopril Other (See Comments)    Caused upper arm and shoulder weakness  . Losartan Other (See Comments)    Caused upper arm and shoulder weakness    Chief Complaint  Patient presents with  . Discharge Note    Discharge home from [facility]     HPI:  61 y.o. male seen today at [facility] for discharge home. He was here for short term rehabilitation for post hospital admission from 03/14/2017-03/18/2017 post fall with subdural hematoma. Neurosurgery was consulted and recommended neurochecks and repeat CT head. Repeat scan showed the bleed to be stable.He has a medical history of HTN, Type 2 DM with Neuropathy,Hyperlipidemia,Depression, Anemia among other conditions.He is seen in his room today.During his stay here in rehab he was treated for hypoglycemia; his Mervyn Gay was reduced to 60 units and Metformin discontinued with much improvement.He sustained a fall episode 03/20/2017 his head CT scan showed no changes from previous results.He was started on omeprazole due to acid reflex.He denies any acute issues  this visit.  He has worked well with PT/OT now stable for discharge home.He will be discharged home with Home health PT/OT to continue with ROM, Exercise, Gait stability and muscle strengthening. FWW recommended but states uses cane at home due to tight narrow space at home. Home health services will be arranged by facility social worker prior to discharge. Prescription medication will be written x 1 month then patient to follow up with PCP in 1-2 weeks.Facility staff report no new concerns.  Past Medical History:  Diagnosis Date  . Abscess of prostate 02/17/2009  . Diabetes mellitus   . Diabetic foot infection ([place]) 12/26/2011  . 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 HYPERLIPIDEMIA 08/26/2007  . Hypertension   . 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 AMPUTATION  12/30/2011   Procedure: AMPUTATION DIGIT;  Surgeon: Marybelle Killings, MD;  Location: [facility];  Service: Orthopedics;  Laterality: Left;  Left Great  . AMPUTATION  03/20/2012   Procedure: AMPUTATION DIGIT;  Surgeon: Mcarthur Rossetti, MD;  Location: [facility];  Service: Orthopedics;  Laterality: Left;  Left foot 3rd toe amputation  . CARDIAC CATHETERIZATION    . I&D EXTREMITY Left 09/28/2015   Procedure: IRRIGATION AND DRAINAGE OF LEFT FOOT with second and third metatarsal amputation;  Surgeon: Mcarthur Rossetti, MD;  Location: [facility];  Service: Orthopedics;  Laterality: Left;  . TOE AMPUTATION  08/2009   R little toe      reports that he has been smoking Cigars and Pipe.  His smokeless tobacco  use includes Chew and Snuff. He reports that he drinks alcohol. He reports that he does not use drugs. Social History   Social History  . Marital status: Divorced    Spouse name: N/A  . Number of children: N/A  . Years of education: N/A   Occupational History  . Not on file.   Social History Main Topics  . Smoking status: Current Some Day Smoker    Types:  Cigars, Pipe  . Smokeless tobacco: Current User    Types: Chew, Snuff     Comment: occasional chew and cigarette, pipe, cigar user  . Alcohol use Yes     Comment: occasional  . Drug use: No  . Sexual activity: Not on file   Other Topics Concern  . Not on file   Social History Narrative   Disability.          Allergies  Allergen Reactions  . Lisinopril Other (See Comments)    Caused upper arm and shoulder weakness  . Losartan Other (See Comments)    Caused upper arm and shoulder weakness    Pertinent  Health Maintenance Due  Topic Date Due  . OPHTHALMOLOGY EXAM  11/22/1965  . COLONOSCOPY  11/22/2005  . URINE MICROALBUMIN  04/22/2014  . INFLUENZA VACCINE  06/26/2017  . HEMOGLOBIN A1C  09/05/2017  . FOOT EXAM  03/06/2018    Medications: Allergies as of 04/09/2017      Reactions   Lisinopril Other (See Comments)   Caused upper arm and shoulder weakness   Losartan Other (See Comments)   Caused upper arm and shoulder weakness      Medication List       Accurate as of 04/09/17 11:59 PM. Always use your most recent med list.          albuterol 108 (90 Base) MCG/ACT inhaler Commonly known as:  PROVENTIL HFA;VENTOLIN HFA Inhale 2 puffs into the lungs every 6 (six) hours as needed. For cold symptoms   cholecalciferol 1000 units tablet Commonly known as:  VITAMIN D Take 1,000 Units by mouth daily.   divalproex 500 MG DR tablet Commonly known as:  DEPAKOTE Take 1,000 mg by mouth at bedtime.   DULoxetine 30 MG capsule Commonly known as:  CYMBALTA Take 1 capsule (30 mg total) by mouth at bedtime.   ferrous sulfate 325 (65 FE) MG tablet Take 1 tablet (325 mg total) by mouth daily with breakfast.   folic acid 967 MCG tablet Commonly known as:  FOLVITE Take 800 mcg by mouth daily.   gabapentin 600 MG tablet Commonly known as:  NEURONTIN Take 600 mg by mouth 2 (two) times daily.   Insulin Glargine 300 UNIT/ML Sopn Commonly known as:  TOUJEO SOLOSTAR Inject  80 Units into the skin every morning. And pen needles 3/day.   omega-3 acid ethyl esters 1 g capsule Commonly known as:  LOVAZA Take 2 capsules (2 g total) by mouth 2 (two) times daily.   omeprazole 20 MG capsule Commonly known as:  PRILOSEC Take 20 mg by mouth daily.   rosuvastatin 5 MG tablet Commonly known as:  CRESTOR Take 1 tablet (5 mg total) by mouth daily.   sennosides-docusate sodium 8.6-50 MG tablet Commonly known as:  SENOKOT-S Take 1 tablet by mouth at bedtime.       Review of Systems  Constitutional: Negative for activity change, appetite change, chills, fatigue and fever.  HENT: Negative for congestion, rhinorrhea, sinus pain, sinus pressure, sneezing and sore throat.   Eyes:  Negative.   Respiratory: Negative for cough, chest tightness, shortness of breath and wheezing.   Cardiovascular: Negative for chest pain, palpitations and leg swelling.  Gastrointestinal: Negative for abdominal distention, abdominal pain, constipation, diarrhea, nausea and vomiting.  Endocrine: Negative.   Genitourinary: Negative for dysuria, flank pain, frequency and urgency.  Musculoskeletal: Positive for gait problem.  Skin: Negative for color change, pallor and rash.  Neurological: Positive for tremors. Negative for dizziness, seizures, syncope, light-headedness and headaches.  Hematological: Does not bruise/bleed easily.  Psychiatric/Behavioral: Negative for agitation, confusion, hallucinations and sleep disturbance. The patient is not nervous/anxious.     Vitals:   04/09/17 1215  BP: 135/75  Pulse: 79  Resp: 20  Temp: 97.8 F (36.6 C)  SpO2: 97%  Weight: 249 lb (112.9 kg)  Height: 6\' 4"  (1.93 m)   Body mass index is 30.31 kg/m. Physical Exam  Constitutional: He is oriented to person, place, and time. He appears well-developed and well-nourished. No distress.  HENT:  Head: Normocephalic.  Mouth/Throat: Oropharynx is clear and moist. No oropharyngeal exudate.  Eyes:  Conjunctivae and EOM are normal. Pupils are equal, round, and reactive to light. Right eye exhibits no discharge. Left eye exhibits no discharge. No scleral icterus.  Neck: Normal range of motion. No JVD present. No thyromegaly present.  Cardiovascular: Normal rate, regular rhythm, normal heart sounds and intact distal pulses.  Exam reveals no gallop and no friction rub.   No murmur heard. Pulmonary/Chest: Effort normal and breath sounds normal. No respiratory distress. He has no wheezes. He has no rales.  Abdominal: Soft. Bowel sounds are normal. He exhibits no distension. There is no tenderness. There is no rebound and no guarding.  Genitourinary:  Genitourinary Comments: Continent   Musculoskeletal: Normal range of motion. He exhibits no edema or tenderness.  Unsteady gait   Lymphadenopathy:    He has no cervical adenopathy.  Neurological: He is oriented to person, place, and time.  Resting tremors to left hand   Skin: Skin is warm and dry. No rash noted. No erythema. No pallor.  Psychiatric: He has a normal mood and affect.    Labs reviewed: Basic Metabolic Panel:  Recent Labs  10/13/16 0425  03/14/17 1822 03/14/17 1836 03/15/17 0428 03/22/17 03/25/17 03/25/17 2229  NA 136  < > 132* 131* 131* 134* 134* 130*  K 4.0  < > 4.2 4.3 3.8 4.4 4.4 4.5  CL 102  < > 99* 98* 97*  --   --  94*  CO2 24  < > 23  --  22  --   --  24  GLUCOSE 225*  < > 201* 196* 154*  --   --  72  BUN 22*  < > 11 11 8 14 17 19   CREATININE 1.14  < > 0.87 0.80 0.81 0.8 0.8 0.89  CALCIUM 8.9  < > 9.3  --  9.0  --   --  8.8*  MG 2.1  --   --   --   --   --   --   --   PHOS 3.4  --   --   --   --   --   --   --   < > = values in this interval not displayed. Liver Function Tests:  Recent Labs  10/13/16 0425 03/14/17 1822 03/22/17 03/25/17 2229  AST 20 20 19 26   ALT 15* 12* 11 14*  ALKPHOS 50 55 47 50  BILITOT 1.1 0.9  --  0.7  PROT 6.3* 7.7  --  6.8  ALBUMIN 3.7 4.4  --  3.6    Recent Labs   03/14/17 1948 03/25/17 2229  LIPASE 14 23    Recent Labs  10/12/16 1830  AMMONIA 21   CBC:  Recent Labs  10/13/16 0425  03/14/17 1822  03/15/17 0428 03/22/17 03/25/17 03/25/17 2326  WBC 10.4  < > 10.0  --  7.1 10.7 9.9 7.2  NEUTROABS 6.1  --  8.2*  --   --   --   --  4.7  HGB 13.2  < > 15.2  < > 13.4 14.0 14.1 13.0  HCT 37.9*  < > 42.0  < > 37.5* 40* 41 38.5*  MCV 86.1  < > 85.4  --  85.2  --   --  87.3  PLT 150  < > 164  --  148* 177 191 175  < > = values in this interval not displayed. Cardiac Enzymes:  Recent Labs  03/14/17 1822 03/25/17 2229 03/26/17 0144  CKTOTAL 237  --   --   TROPONINI  --  0.03* <0.03    Recent Labs  03/18/17 1115 03/25/17 2143 03/25/17 2248  GLUCAP 144* 86 76    Procedures and Imaging Studies : Dg Chest 2 View  Result Date: 03/25/2017 CLINICAL DATA:  Central chest pain for 2-3 days EXAM: CHEST  2 VIEW COMPARISON:  10/12/2016 FINDINGS: The heart size and mediastinal contours are within normal limits. Both lungs are clear. The visualized skeletal structures are unremarkable. IMPRESSION: No active cardiopulmonary disease. Electronically Signed   By: Donavan Foil M.D.   On: 03/25/2017 22:24   Dg Forearm Left  Result Date: 03/14/2017 CLINICAL DATA:  Status post fall at home, with left forearm pain. Initial encounter. EXAM: LEFT FOREARM - 2 VIEW COMPARISON:  None. FINDINGS: There is no evidence of fracture or dislocation. The radius and ulna appear intact. The elbow joint is incompletely assessed, but appears grossly unremarkable. The carpal rows appear grossly intact, and demonstrate normal alignment. A peripheral IV catheter is noted overlying the mid forearm. There is medial soft tissue swelling at the elbow. IMPRESSION: No evidence of fracture or dislocation. Electronically Signed   By: Garald Balding M.D.   On: 03/14/2017 21:31   Ct Head Wo Contrast  Result Date: 03/22/2017 CLINICAL DATA:  Follow-up subdural hematoma, headaches pain  over the right eye EXAM: CT HEAD WITHOUT CONTRAST TECHNIQUE: Contiguous axial images were obtained from the base of the skull through the vertex without intravenous contrast. COMPARISON:  03/15/2017 CT, MRI 03/14/2017 FINDINGS: Brain: No acute territorial infarction or intracranial mass is visualized. The previously noted right parietal subdural hematoma has largely resolved with tiny residual visualize, measuring 3 mm in maximum thickness on coronal views. No mass effect or midline shift. No new hemorrhage. Moderate atrophy. Periventricular white matter small vessel ischemic changes. Stable ventricle size. Vascular: No hyperdense vessels. Scattered calcifications at the carotid siphons. Skull: No fracture or suspicious bone lesion Sinuses/Orbits: Mucosal thickening in the ethmoid sinuses, small retention cyst right maxillary sinus. No acute orbital abnormality. Other: None IMPRESSION: 1. Near complete resolution of previously noted right parietal subdural hematoma with small residual subdural now visualize, measuring 3 mm in maximum thickness. No significant mass effect or midline shift. No new hemorrhage. 2. Stable atrophy and white matter small vessel ischemic changes. Electronically Signed   By: Donavan Foil M.D.   On: 03/22/2017 16:28   Ct Head Wo Contrast  Result  Date: 03/15/2017 CLINICAL DATA:  Follow-up exam for subdural hemorrhage. EXAM: CT HEAD WITHOUT CONTRAST TECHNIQUE: Contiguous axial images were obtained from the base of the skull through the vertex without intravenous contrast. COMPARISON:  Prior MRI from 03/14/2017. FINDINGS: Brain: Previously identified subdural hemorrhage overlying the right parietal convexity is grossly stable measuring 9 mm in maximal thickness. No significant mass effect. No evidence for interval expansion. No other acute intracranial hemorrhage. No evidence for acute large vessel territory infarct. No midline shift or hydrocephalus. No extra-axial fluid collection. Stable  atrophy with chronic small vessel ischemic disease. Vascular: No hyperdense vessel. Skull: Scalp soft tissues demonstrate no acute abnormality. Calvarium intact. Sinuses/Orbits: Globes and oval soft tissues within normal limits. Scattered mucosal thickening within the ethmoidal air cells. Small retention cysts noted within the right maxillary sinus. Visualized paranasal sinuses are otherwise clear. No mastoid effusion. The IMPRESSION: 1. No significant interval change in small subdural hematoma overlying the right parietal convexity, measuring up to 9 mm on current exam. No significant mass effect. 2. No other new acute intracranial process. 3. Generalized cerebral atrophy with moderate chronic microvascular ischemic disease. Electronically Signed   By: Jeannine Boga M.D.   On: 03/15/2017 05:35   Mr Brain Wo Contrast  Result Date: 03/14/2017 CLINICAL DATA:  Found down status post fall EXAM: MRI HEAD WITHOUT CONTRAST TECHNIQUE: Multiplanar, multiecho pulse sequences of the brain and surrounding structures were obtained without intravenous contrast. COMPARISON:  Head CT 10/12/2016 FINDINGS: The examination had to be discontinued prior to completion due to patient inability to lie on his back and follow the technologist's instructions. Sagittal T1 weighted imaging and diffusion-weighted imaging with corresponding ADC maps were obtained. There is a 1 cm posterior right convexity subdural hematoma. The diffusion-weighted images are degraded by motion, but there is no visible evidence of ischemia. IMPRESSION: 1. Truncated examination due to patient confusion and inability to lie flat. 2. Small, 1 cm subdural hematoma over the posterior right convexity. No associated midline shift. Critical Value/emergent results were called by telephone at the time of interpretation on 03/14/2017 at 7:02 pm to Dr. Orlie Dakin , who verbally acknowledged these results. Electronically Signed   By: Ulyses Jarred M.D.   On:  03/14/2017 19:06   Dg Shoulder Left  Result Date: 03/14/2017 CLINICAL DATA:  Status post fall at home, with left arm and shoulder pain. Initial encounter. EXAM: LEFT SHOULDER - 2+ VIEW COMPARISON:  None. FINDINGS: There is no evidence of fracture or dislocation. The left humeral head is seated within the glenoid fossa. Mild degenerative change is noted at the left acromioclavicular joint. No significant soft tissue abnormalities are seen. The visualized portions of the left lung are clear. IMPRESSION: No evidence of fracture or dislocation. Electronically Signed   By: Garald Balding M.D.   On: 03/14/2017 21:29   Dg Humerus Left  Result Date: 03/14/2017 CLINICAL DATA:  Status post fall at home, with left arm and shoulder pain. Initial encounter. EXAM: LEFT HUMERUS - 2+ VIEW COMPARISON:  None. FINDINGS: There is no evidence of fracture or dislocation. The left humerus appears intact. The elbow joint is grossly unremarkable, though incompletely assessed. The left humeral head remains seated at the glenoid fossa. No significant soft tissue abnormalities are characterized on radiograph. IMPRESSION: No evidence of fracture or dislocation. Electronically Signed   By: Garald Balding M.D.   On: 03/14/2017 21:30   Assessment/Plan:    1. Unsteady gait   Has worked well with PT/ OT. Will discharge home  PT/OT to continue with ROM, Exercise, Gait stability and muscle strengthening. FWW recommended but states uses cane at home due to tight narrow space at home. Fall and safety precautions.  2. Diabetes mellitus due to underlying condition with diabetic neuropathy, with long-term current use of insulin  Recent Hgb A1C 11.2 ( 03/06/2017).Had episodes of hypoglycemia during stay in rehab. Tuojeo reduced to 60 units daily and Metformin discontinued. His appetite has improved.CBG's ranging now in the 80's-200's occasional 300's noted suspected due to snacks. Continue on Tuojeo 60 units SQ daily. On Gabapentin for  neuropathy.Continue to encourage CCD. Avoid skipping meals.On Statin. Annual eye and foot exam deferred to PCP. Monitor Hgb A1C   3. ANEMIA, VITAMIN B12 DEFICIENCY Continue on Ferrous sulfate and vit B12 supplements.check CBC in 1-2 weeks with PCP  4. Dyslipidemia Continue on Lovaza and crestor. Monitor lipid panel.   5. Depression Stable. Continue on Cymbalta.    6. Vitamin D deficiency Continue on vit D supplements. Continue to monitor vit D levels.   7. Gastroesophageal reflux disease without esophagitis Hx hiatal hernia. Omeprazole initiated during rehab stay with symptoms relief. Monitor Mg level.   8. Slow transit constipation Current regimen effective. Continue to monitor.   Patient is being discharged with the following home health services:    -PT/OT for ROM, exercise, gait stability and muscle strengthening  Patient is being discharged with the following durable medical equipment:    - FWW recommended but states uses cane at home due to tight narrow space at home.  Patient has been advised to f/u with their PCP in 1-2 weeks to for a transitions of care visit.Social services at their facility was responsible for arranging this appointment.  Pt was provided with adequate prescriptions of noncontrolled medications to reach the scheduled appointment.For controlled substances, a limited supply was provided as appropriate for the individual patient. If the pt normally receives these medications from a pain clinic or has a contract with another physician, these medications should be received from that clinic or physician only).    Future labs/tests needed:  CBC, BMP in 1-2 weeks PCP

## 2017-04-13 DIAGNOSIS — M6281 Muscle weakness (generalized): Secondary | ICD-10-CM | POA: Diagnosis not present

## 2017-04-13 DIAGNOSIS — R2681 Unsteadiness on feet: Secondary | ICD-10-CM | POA: Diagnosis not present

## 2017-04-13 DIAGNOSIS — S069X0D Unspecified intracranial injury without loss of consciousness, subsequent encounter: Secondary | ICD-10-CM | POA: Diagnosis not present

## 2017-04-13 DIAGNOSIS — E119 Type 2 diabetes mellitus without complications: Secondary | ICD-10-CM | POA: Diagnosis not present

## 2017-05-03 ENCOUNTER — Encounter: Payer: Self-pay | Admitting: Endocrinology

## 2017-05-03 ENCOUNTER — Ambulatory Visit (INDEPENDENT_AMBULATORY_CARE_PROVIDER_SITE_OTHER): Payer: Medicare HMO | Admitting: Endocrinology

## 2017-05-03 VITALS — BP 130/82 | HR 89 | Ht 75.0 in | Wt 243.0 lb

## 2017-05-03 DIAGNOSIS — Z794 Long term (current) use of insulin: Secondary | ICD-10-CM

## 2017-05-03 DIAGNOSIS — E1151 Type 2 diabetes mellitus with diabetic peripheral angiopathy without gangrene: Secondary | ICD-10-CM | POA: Diagnosis not present

## 2017-05-03 DIAGNOSIS — E119 Type 2 diabetes mellitus without complications: Secondary | ICD-10-CM | POA: Diagnosis not present

## 2017-05-03 LAB — POCT GLYCOSYLATED HEMOGLOBIN (HGB A1C): Hemoglobin A1C: 9

## 2017-05-03 MED ORDER — INSULIN GLARGINE 300 UNIT/ML ~~LOC~~ SOPN: 60.0000 [IU] | PEN_INJECTOR | SUBCUTANEOUS | Status: AC

## 2017-05-03 MED ORDER — METFORMIN HCL ER 500 MG PO TB24: 2000.0000 mg | ORAL_TABLET | Freq: Every day | ORAL | 11 refills | Status: DC

## 2017-05-03 NOTE — Progress Notes (Signed)
Subjective:    Patient ID: Caleb Joseph, male    DOB: Jul 31, 1956, 61 y.o.   MRN: [id number]  HPI Pt returns for f/u of diabetes mellitus: DM type: Insulin-requiring type 2 Dx'ed: 6759 Complications: polyneuropathy, nephropathy, toe amputations, and PAD Therapy: insulin since 2004 DKA: never Severe hypoglycemia: never Pancreatitis: never Other: he takes qd insulin, due to h/o med noncompliance.  Interval history: It varies from 200-400's.  He has missed the insulin approx 3 times per week, "because I am tired and burned out."  He goes to [facility].  He goes there Q 3 months.  toujeo was reduced to 60 units qam, in the hospital.  He has been home x 4 weeks.  He brings a record of his cbg's which I have reviewed today.  It varies from 179-314.  He says he still takes metformin, despite its not being on med list.  Past Medical History:  Diagnosis Date  . Abscess of prostate 02/17/2009  . Diabetes mellitus   . Diabetic foot infection ([place]) 12/26/2011  . 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 HYPERLIPIDEMIA 08/26/2007  . Hypertension   . 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 AMPUTATION  12/30/2011   Procedure: AMPUTATION DIGIT;  Surgeon: Marybelle Killings, MD;  Location: [facility];  Service: Orthopedics;  Laterality: Left;  Left Great  . AMPUTATION  03/20/2012   Procedure: AMPUTATION DIGIT;  Surgeon: Mcarthur Rossetti, MD;  Location: [facility];  Service: Orthopedics;  Laterality: Left;  Left foot 3rd toe amputation  . CARDIAC CATHETERIZATION    . I&D EXTREMITY Left 09/28/2015   Procedure: IRRIGATION AND DRAINAGE OF LEFT FOOT with second and third metatarsal amputation;  Surgeon: Mcarthur Rossetti, MD;  Location: [facility];  Service: Orthopedics;  Laterality: Left;  . TOE AMPUTATION  08/2009   R little toe    Social History   Social History  . Marital status: Divorced    Spouse name: N/A  .  Number of children: N/A  . Years of education: N/A   Occupational History  . Not on file.   Social History Main Topics  . Smoking status: Current Some Day Smoker    Types: Cigars, Pipe  . Smokeless tobacco: Current User    Types: Chew, Snuff     Comment: occasional chew and cigarette, pipe, cigar user  . Alcohol use Yes     Comment: occasional  . Drug use: No  . Sexual activity: Not on file   Other Topics Concern  . Not on file   Social History Narrative   Disability.          Current Outpatient Prescriptions on File Prior to Visit  Medication Sig Dispense Refill  . albuterol (PROVENTIL HFA;VENTOLIN HFA) 108 (90 BASE) MCG/ACT inhaler Inhale 2 puffs into the lungs every 6 (six) hours as needed. For cold symptoms 1 Inhaler 1  . cholecalciferol (VITAMIN D) 1000 UNITS tablet Take 1,000 Units by mouth daily.    . divalproex (DEPAKOTE) 500 MG DR tablet Take 1,000 mg by mouth at bedtime.    . DULoxetine (CYMBALTA) 30 MG capsule Take 1 capsule (30 mg total) by mouth at bedtime. 30 capsule 11  . ferrous sulfate 325 (65 FE) MG tablet Take 1 tablet (325 mg total) by mouth daily with breakfast. 30 tablet 0  . folic acid (FOLVITE) 163 MCG tablet Take 800 mcg by mouth daily.     Marland Kitchen  gabapentin (NEURONTIN) 600 MG tablet Take 600 mg by mouth 2 (two) times daily.    Marland Kitchen omega-3 acid ethyl esters (LOVAZA) 1 G capsule Take 2 capsules (2 g total) by mouth 2 (two) times daily. 180 capsule 3  . omeprazole (PRILOSEC) 20 MG capsule Take 20 mg by mouth daily.    . rosuvastatin (CRESTOR) 5 MG tablet Take 1 tablet (5 mg total) by mouth daily. 30 tablet 11  . sennosides-docusate sodium (SENOKOT-S) 8.6-50 MG tablet Take 1 tablet by mouth at bedtime.     No current facility-administered medications on file prior to visit.     Allergies  Allergen Reactions  . Lisinopril Other (See Comments)    Caused upper arm and shoulder weakness  . Losartan Other (See Comments)    Caused upper arm and shoulder  weakness    Family History  Problem Relation Age of Onset  . Diabetes Mother     BP 130/82   Pulse 89   Ht 6\' 3"  (1.905 m)   Wt 243 lb (110.2 kg)   SpO2 95%   BMI 30.37 kg/m    Review of Systems He denies hypoglycemia.      Objective:   Physical Exam VITAL SIGNS:  See vs page GENERAL: no distress Pulses: dorsalis pedis intact bilat.   MSK: no deformity of the feet, but these toes have been amputated: left 1, 2, and 5; right 1 and 5.  CV: 1+ bilat leg edema.  Skin:  no ulcer on the feet.  normal color and temp on the feet. Neuro: sensation is absent to touch on the feet and legs.    A1c=9.0%    Assessment & Plan:  Insulin-requiring type 2 DM, with PAD: worse. Depression: this complicates the rx of DM  Patient Instructions  check your blood sugar twice a day.  vary the time of day when you check, between before the 3 meals, and at bedtime.  also check if you have symptoms of your blood sugar being too high or too low.  please keep a record of the readings and bring it to your next appointment here (or you can bring the meter itself).  You can write it on any piece of paper.  please call us sooner if your blood sugar goes below 70, or if you have a lot of readings over 200. Please continue the same medications.  It is really important to never miss it.   On this type of insulin schedule, you should eat meals on a regular schedule.  If a meal is missed or significantly delayed, your blood sugar could go low.  Please call Beverly Sessions, to get a follow-up appointment.  Please come back for a follow-up appointment in 3 months.

## 2017-05-03 NOTE — Patient Instructions (Addendum)
check your blood sugar twice a day.  vary the time of day when you check, between before the 3 meals, and at bedtime.  also check if you have symptoms of your blood sugar being too high or too low.  please keep a record of the readings and bring it to your next appointment here (or you can bring the meter itself).  You can write it on any piece of paper.  please call us sooner if your blood sugar goes below 70, or if you have a lot of readings over 200. Please continue the same medications.  It is really important to never miss it.   On this type of insulin schedule, you should eat meals on a regular schedule.  If a meal is missed or significantly delayed, your blood sugar could go low.  Please call Beverly Sessions, to get a follow-up appointment.  Please come back for a follow-up appointment in 3 months.

## 2017-05-06 ENCOUNTER — Other Ambulatory Visit: Payer: Self-pay

## 2017-05-06 MED ORDER — METFORMIN HCL ER 500 MG PO TB24: 2000.0000 mg | ORAL_TABLET | Freq: Every day | ORAL | 11 refills | Status: AC

## 2017-05-14 ENCOUNTER — Encounter: Payer: Self-pay | Admitting: Podiatry

## 2017-05-14 ENCOUNTER — Ambulatory Visit (INDEPENDENT_AMBULATORY_CARE_PROVIDER_SITE_OTHER): Payer: Medicare HMO | Admitting: Podiatry

## 2017-05-14 DIAGNOSIS — M79676 Pain in unspecified toe(s): Secondary | ICD-10-CM | POA: Diagnosis not present

## 2017-05-14 DIAGNOSIS — E1149 Type 2 diabetes mellitus with other diabetic neurological complication: Secondary | ICD-10-CM

## 2017-05-14 DIAGNOSIS — B351 Tinea unguium: Secondary | ICD-10-CM

## 2017-05-14 DIAGNOSIS — I739 Peripheral vascular disease, unspecified: Secondary | ICD-10-CM

## 2017-05-14 MED ORDER — DOXYCYCLINE HYCLATE 100 MG PO TABS: 100.0000 mg | ORAL_TABLET | Freq: Two times a day (BID) | ORAL | 0 refills | Status: DC

## 2017-05-14 NOTE — Progress Notes (Addendum)
Subjective:     Patient ID: Caleb Joseph, male   DOB: January 05, 1956, 61 y.o.   MRN: [id number]  HPIThis patient returns to the office for evaluation of his diabetic feet..  .  Patient has minimal callus noted on the forefoot of both feet.  This is due to the fact that he was at a rehabilitation facility following a fall and has limited his walking when he returned home  He has long painful nails on his remaining toes.   He presents for evaluation and treatment.   Review of Systems     Objective:   Physical Exam GENERAL APPEARANCE: Alert, conversant. Appropriately groomed. No acute distress.  VASCULAR: Pedal pulses palpable at  [facility] and PT bilateral.  Capillary refill time is immediate to all digits,  Normal temperature gradient.   NEUROLOGIC: sensation is absent  to 5.07 monofilament at 5/5 sites bilateral.  Light touch is intact bilateral, Muscle strength normal.  MUSCULOSKELETAL: acceptable muscle strength, tone and stability bilateral.  Intrinsic muscluature intact bilateral.  Rectus appearance of foot and digits noted bilateral. Amputation 1,2,3 left digits.  Excision 3rd metatarsal head left foot.  Amputation 5th ray right foot and distal phalanx IPJ right foot.  DERMATOLOGIC: skin color, texture, and turgor are within normal limits.  No preulcerative lesions or ulcers  are seen, no interdigital maceration noted.  No open lesions present.  . No drainage noted. .  No evidence of ulcerations or infections both feet.   NAILS  Thick disfigured discolored nails on five toes both feet.      Assessment:     Onychomycosis      Plan:     [facility].    RTC 10 weeks. Patient discussed his rehabilitation with me and it was determined that he was a fall risk.  Therefore, I had him evaluated by Liliane Channel.  I believe his frequent falls can be directly related to the multiple amputation of multiple digits on both feet.  After meeting with rick this patient said he was to receive specially made shoes.   Prescribed doxycycline. Prophylactically in case the ulcers break down and become infected   Gardiner Barefoot DPM   Gardiner Barefoot DPM

## 2017-06-11 ENCOUNTER — Ambulatory Visit: Payer: Medicare HMO | Admitting: Orthotics

## 2017-06-27 ENCOUNTER — Telehealth: Payer: Self-pay | Admitting: Podiatry

## 2017-06-27 NOTE — Telephone Encounter (Signed)
Left message for pt that his inserts are on back order and as soon as we get them in we will call pt to schedule an appt to pick up.

## 2017-07-02 ENCOUNTER — Emergency Department (HOSPITAL_COMMUNITY)
Admission: EM | Admit: 2017-07-02 | Discharge: 2017-07-03 | Disposition: A | Discharge status: Home / Self Care | Payer: Medicare HMO | Attending: Physician Assistant | Admitting: Physician Assistant

## 2017-07-02 ENCOUNTER — Encounter (HOSPITAL_COMMUNITY): Payer: Self-pay

## 2017-07-02 DIAGNOSIS — I10 Essential (primary) hypertension: Secondary | ICD-10-CM | POA: Diagnosis not present

## 2017-07-02 DIAGNOSIS — W19XXXA Unspecified fall, initial encounter: Secondary | ICD-10-CM | POA: Diagnosis not present

## 2017-07-02 DIAGNOSIS — Y92003 Bedroom of unspecified non-institutional (private) residence as the place of occurrence of the external cause: Secondary | ICD-10-CM | POA: Diagnosis not present

## 2017-07-02 DIAGNOSIS — Y999 Unspecified external cause status: Secondary | ICD-10-CM | POA: Insufficient documentation

## 2017-07-02 DIAGNOSIS — Y92009 Unspecified place in unspecified non-institutional (private) residence as the place of occurrence of the external cause: Secondary | ICD-10-CM

## 2017-07-02 DIAGNOSIS — R197 Diarrhea, unspecified: Secondary | ICD-10-CM | POA: Diagnosis present

## 2017-07-02 DIAGNOSIS — R531 Weakness: Secondary | ICD-10-CM | POA: Diagnosis not present

## 2017-07-02 DIAGNOSIS — F17290 Nicotine dependence, other tobacco product, uncomplicated: Secondary | ICD-10-CM | POA: Insufficient documentation

## 2017-07-02 DIAGNOSIS — Z794 Long term (current) use of insulin: Secondary | ICD-10-CM | POA: Insufficient documentation

## 2017-07-02 DIAGNOSIS — E1140 Type 2 diabetes mellitus with diabetic neuropathy, unspecified: Secondary | ICD-10-CM | POA: Insufficient documentation

## 2017-07-02 DIAGNOSIS — Z79899 Other long term (current) drug therapy: Secondary | ICD-10-CM | POA: Diagnosis not present

## 2017-07-02 DIAGNOSIS — Y9389 Activity, other specified: Secondary | ICD-10-CM | POA: Diagnosis not present

## 2017-07-02 LAB — CBC WITH DIFFERENTIAL/PLATELET
BASOS ABS: 0 10*3/uL (ref 0.0–0.1)
BASOS PCT: 1 %
EOS ABS: 0.2 10*3/uL (ref 0.0–0.7)
EOS PCT: 2 %
HCT: 37.8 % — ABNORMAL LOW (ref 39.0–52.0)
Hemoglobin: 13.2 g/dL (ref 13.0–17.0)
Lymphocytes Relative: 34 %
Lymphs Abs: 2.7 10*3/uL (ref 0.7–4.0)
MCH: 29.7 pg (ref 26.0–34.0)
MCHC: 34.9 g/dL (ref 30.0–36.0)
MCV: 85.1 fL (ref 78.0–100.0)
Monocytes Absolute: 0.6 10*3/uL (ref 0.1–1.0)
Monocytes Relative: 7 %
Neutro Abs: 4.4 10*3/uL (ref 1.7–7.7)
Neutrophils Relative %: 56 %
PLATELETS: 166 10*3/uL (ref 150–400)
RBC: 4.44 MIL/uL (ref 4.22–5.81)
RDW: 11.9 % (ref 11.5–15.5)
WBC: 7.8 10*3/uL (ref 4.0–10.5)

## 2017-07-02 LAB — I-STAT TROPONIN, ED: Troponin i, poc: 0.01 ng/mL (ref 0.00–0.08)

## 2017-07-02 MED ORDER — SODIUM CHLORIDE 0.9 % IV BOLUS (SEPSIS)
1000.0000 mL | Freq: Once | INTRAVENOUS | Status: AC
  Administered 2017-07-02: 1000 mL via INTRAVENOUS

## 2017-07-02 NOTE — ED Provider Notes (Signed)
[facility] DEPT Provider Note   CSN: [id number] Arrival date & time: 07/02/17  2225     History   Chief Complaint Chief Complaint  Patient presents with  . Fall  . Weakness    HPI WRAY GOEHRING is a 61 y.o. male.  This a 61 year old chronically ill male who states that he fell today between his bed and the door frame after having multiple episodes of diarrhea throughout the day.  At this point, he is complaining that his legs feel tingly, but he does have a history of diabetic neuropathy. He was able to crawl from his position to the phone to call EMS.      Past Medical History:  Diagnosis Date  . Abscess of prostate 02/17/2009  . Diabetes mellitus   . Diabetic foot infection ([place]) 12/26/2011  . 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 HYPERLIPIDEMIA 08/26/2007  . Hypertension   . 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 Subdural hematoma ([place]) 03/15/2017  . Fall 03/14/2017  . Tobacco abuse 03/14/2017  . SDH (subdural hematoma) ([place]) 03/14/2017  . Acute encephalopathy 03/14/2017  . GERD (gastroesophageal reflux disease) 03/14/2017  . Acute metabolic encephalopathy 01/74/9449  . Altered mental status, unspecified 10/12/2016  . Encephalopathy 10/12/2016  . Type 2 diabetes mellitus ([place]) 09/25/2015  . Hyponatremia 09/25/2015  . Lactic acidosis 09/25/2015  . Dysplastic nevus of skin 03/17/2013  . Hx of noncompliance with medical treatment, presenting hazards to health 12/16/2012  . ULNAR NEUROPATHY, RIGHT 06/02/2010  . Other specified disorder of male genital organs(608.89) 11/22/2009  . ELECTROCARDIOGRAM, ABNORMAL 11/22/2009  . PVD 07/19/2009  . DYSPNEA 07/19/2009  . 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 ANEMIA, VITAMIN B12 DEFICIENCY 07/17/2008  . DENTAL CARIES 07/07/2008  . Depression 05/11/2008  . Diabetic neuropathy ([place]) 10/02/2007  . Diabetes mellitus without  complication ([place]) 67/59/1638  . OLIGOSPERMIA 10/02/2007  . INGUINAL PAIN, RIGHT 10/02/2007  . Dyslipidemia 08/26/2007  . Essential hypertension 08/26/2007    Past Surgical History:  Procedure Laterality Date  . AMPUTATION  12/30/2011   Procedure: AMPUTATION DIGIT;  Surgeon: Marybelle Killings, MD;  Location: [facility];  Service: Orthopedics;  Laterality: Left;  Left Great  . AMPUTATION  03/20/2012   Procedure: AMPUTATION DIGIT;  Surgeon: Mcarthur Rossetti, MD;  Location: [facility];  Service: Orthopedics;  Laterality: Left;  Left foot 3rd toe amputation  . CARDIAC CATHETERIZATION    . I&D EXTREMITY Left 09/28/2015   Procedure: IRRIGATION AND DRAINAGE OF LEFT FOOT with second and third metatarsal amputation;  Surgeon: Mcarthur Rossetti, MD;  Location: [facility];  Service: Orthopedics;  Laterality: Left;  . TOE AMPUTATION  08/2009   R little toe       Home Medications    Prior to Admission medications   Medication Sig Start Date End Date Taking? Authorizing Provider  albuterol (PROVENTIL HFA;VENTOLIN HFA) 108 (90 BASE) MCG/ACT inhaler Inhale 2 puffs into the lungs every 6 (six) hours as needed. For cold symptoms 12/16/12   Gerda Diss, DO  cholecalciferol (VITAMIN D) 1000 UNITS tablet Take 1,000 Units by mouth daily. 06/23/15   [provider]  divalproex (DEPAKOTE) 500 MG DR tablet Take 1,000 mg by mouth at bedtime.    [provider]  doxycycline (VIBRA-TABS) 100 MG tablet Take 1 tablet (100 mg total) by mouth 2 (two) times daily. 05/14/17   Gardiner Barefoot, DPM  DULoxetine (CYMBALTA) 30 MG capsule  Take 1 capsule (30 mg total) by mouth at bedtime. 12/16/12   Gerda Diss, DO  ferrous sulfate 325 (65 FE) MG tablet Take 1 tablet (325 mg total) by mouth daily with breakfast. 10/15/16   Lavina Hamman, MD  folic acid (FOLVITE) 644 MCG tablet Take 800 mcg by mouth daily.     [provider]  gabapentin (NEURONTIN) 600 MG tablet Take 600 mg by mouth 2 (two) times  daily. 01/28/17   [provider]  Insulin Glargine (TOUJEO SOLOSTAR) 300 UNIT/ML SOPN Inject 60 Units into the skin every morning. And pen needles 3/day. 05/03/17   Renato Shin, MD  metFORMIN (GLUCOPHAGE-XR) 500 MG 24 hr tablet Take 4 tablets (2,000 mg total) by mouth daily with breakfast. 05/06/17   Renato Shin, MD  omega-3 acid ethyl esters (LOVAZA) 1 G capsule Take 2 capsules (2 g total) by mouth 2 (two) times daily. 09/14/13   Gerda Diss, DO  omeprazole (PRILOSEC) 20 MG capsule Take 20 mg by mouth daily.    [provider]  rosuvastatin (CRESTOR) 5 MG tablet Take 1 tablet (5 mg total) by mouth daily. 03/17/13   Gerda Diss, DO  sennosides-docusate sodium (SENOKOT-S) 8.6-50 MG tablet Take 1 tablet by mouth at bedtime.    [provider]    Family History Family History  Problem Relation Age of Onset  . Diabetes Mother     Social History Social History  Substance Use Topics  . Smoking status: Current Some Day Smoker    Types: Cigars, Pipe  . Smokeless tobacco: Current User    Types: Chew, Snuff     Comment: occasional chew and cigarette, pipe, cigar user  . Alcohol use Yes     Comment: occasional     Allergies   Lisinopril and Losartan   Review of Systems Review of Systems  Constitutional: Negative for fever.  Respiratory: Negative for shortness of breath.   Cardiovascular: Negative for chest pain and leg swelling.  Gastrointestinal: Positive for diarrhea. Negative for nausea and vomiting.  Genitourinary: Negative for dysuria.  Musculoskeletal: Negative for back pain.  Neurological: Positive for weakness.  All other systems reviewed and are negative.    Physical Exam Updated Vital Signs BP (!) 150/97   Pulse (!) 110   Temp 98 F (36.7 C) (Oral)   Resp 18   SpO2 100%   Physical Exam  Constitutional: He appears well-developed and well-nourished.  HENT:  Head: Normocephalic.  Eyes: Pupils are equal, round, and reactive to  light.  Cardiovascular: Normal rate.   Abdominal: Soft. He exhibits no distension. There is no tenderness.  Musculoskeletal: Normal range of motion. He exhibits no edema or tenderness.  Missing multiple toes from surgical amputations   Neurological: He is alert.  Skin: Skin is warm.  Nursing note and vitals reviewed.    ED Treatments / Results  Labs (all labs ordered are listed, but only abnormal results are displayed) Labs Reviewed  CBC WITH DIFFERENTIAL/PLATELET - Abnormal; Notable for the following:       Result Value   HCT 37.8 (*)    All other components within normal limits  I-STAT CHEM 8, ED - Abnormal; Notable for the following:    Sodium 131 (*)    Chloride 95 (*)    BUN 25 (*)    Creatinine, Ser 1.30 (*)    Glucose, Bld 148 (*)    All other components within normal limits  I-STAT TROPONIN, ED  EKG  EKG Interpretation None       Radiology No results found.  Procedures Procedures (including critical care time)  Medications Ordered in ED Medications  sodium chloride 0.9 % bolus 1,000 mL (0 mLs Intravenous Stopped 07/03/17 0026)     Initial Impression / Assessment and Plan / ED Course  I have reviewed the triage vital signs and the nursing notes.  Pertinent labs & imaging results that were available during my care of the patient were reviewed by me and considered in my medical decision making (see chart for details).     Patient has been able to ambulate in room with cane , stand at bedside to use urinal, has eaten, no BM;s since arrival Slight dehydration supplemented with IV fluid  Final Clinical Impressions(s) / ED Diagnoses   Final diagnoses:  Diarrhea, unspecified type  Weakness  Fall in home, initial encounter    New Prescriptions New Prescriptions   No medications on file     Junius Creamer, NP 07/03/17 [place]    Macarthur Critchley, MD 07/06/17 [phone]

## 2017-07-02 NOTE — ED Triage Notes (Signed)
To hallway via EMS.  Onset tonight pt was walking, legs gave out, fell, crawled to phone.  Onset 3 days increased weakness in LE and edema bilateral feet.  Walks with cane. No LOC.  EMS reports they will be contacting APS because there was no food in the home, cockroaches in kitchen, pt is covered in feces, front porch is rotting, EMS had hard time getting and off and on porch.  Pt is A&O.

## 2017-07-03 LAB — I-STAT CHEM 8, ED
BUN: 25 mg/dL — AB (ref 6–20)
CALCIUM ION: 1.15 mmol/L (ref 1.15–1.40)
CREATININE: 1.3 mg/dL — AB (ref 0.61–1.24)
Chloride: 95 mmol/L — ABNORMAL LOW (ref 101–111)
Glucose, Bld: 148 mg/dL — ABNORMAL HIGH (ref 65–99)
HCT: 41 % (ref 39.0–52.0)
Hemoglobin: 13.9 g/dL (ref 13.0–17.0)
Potassium: 4.9 mmol/L (ref 3.5–5.1)
SODIUM: 131 mmol/L — AB (ref 135–145)
TCO2: 27 mmol/L (ref 0–100)

## 2017-07-03 NOTE — ED Notes (Signed)
Pt understood dc material. NAD NOted 

## 2017-07-03 NOTE — Discharge Instructions (Signed)
Make an appointment with your PCP for follow up ask for a referral for physical therapy

## 2017-07-03 NOTE — ED Notes (Signed)
Report given to PTAR 

## 2017-07-15 ENCOUNTER — Ambulatory Visit (INDEPENDENT_AMBULATORY_CARE_PROVIDER_SITE_OTHER): Payer: Medicare HMO | Admitting: Orthotics

## 2017-07-15 DIAGNOSIS — E1149 Type 2 diabetes mellitus with other diabetic neurological complication: Secondary | ICD-10-CM

## 2017-07-15 DIAGNOSIS — Z89411 Acquired absence of right great toe: Secondary | ICD-10-CM

## 2017-07-15 DIAGNOSIS — L89891 Pressure ulcer of other site, stage 1: Secondary | ICD-10-CM

## 2017-07-15 DIAGNOSIS — B351 Tinea unguium: Secondary | ICD-10-CM

## 2017-07-15 DIAGNOSIS — E0849 Diabetes mellitus due to underlying condition with other diabetic neurological complication: Secondary | ICD-10-CM

## 2017-07-15 DIAGNOSIS — S98112A Complete traumatic amputation of left great toe, initial encounter: Secondary | ICD-10-CM

## 2017-07-15 DIAGNOSIS — M79676 Pain in unspecified toe(s): Secondary | ICD-10-CM

## 2017-07-15 DIAGNOSIS — Z9889 Other specified postprocedural states: Secondary | ICD-10-CM

## 2017-07-15 DIAGNOSIS — Z89429 Acquired absence of other toe(s), unspecified side: Secondary | ICD-10-CM

## 2017-07-15 DIAGNOSIS — L84 Corns and callosities: Secondary | ICD-10-CM

## 2017-07-15 NOTE — Progress Notes (Signed)

## 2017-07-19 ENCOUNTER — Telehealth: Payer: Self-pay | Admitting: Family Medicine

## 2017-07-19 NOTE — Telephone Encounter (Signed)
Called and clarified meds. Also I am faxing a copy of his current meds to his doctor Dr. Anastasia Pall.

## 2017-07-19 NOTE — Telephone Encounter (Signed)
Langley Gauss, nurse, needing to go over meds.  Patient may not be taking meds as directed.  Ty,  -LL

## 2017-07-30 ENCOUNTER — Ambulatory Visit (INDEPENDENT_AMBULATORY_CARE_PROVIDER_SITE_OTHER): Payer: Medicare HMO | Admitting: Orthotics

## 2017-07-30 DIAGNOSIS — S98112A Complete traumatic amputation of left great toe, initial encounter: Secondary | ICD-10-CM

## 2017-07-30 DIAGNOSIS — E1149 Type 2 diabetes mellitus with other diabetic neurological complication: Secondary | ICD-10-CM

## 2017-07-30 NOTE — Progress Notes (Signed)
Patient came in today for adjustments to F/O...added valgus wedge to provide lateral stability.

## 2017-08-05 ENCOUNTER — Encounter: Payer: Self-pay | Admitting: Endocrinology

## 2017-08-05 ENCOUNTER — Ambulatory Visit (INDEPENDENT_AMBULATORY_CARE_PROVIDER_SITE_OTHER): Payer: Medicare HMO | Admitting: Endocrinology

## 2017-08-05 VITALS — BP 130/84 | HR 96 | Wt 241.0 lb

## 2017-08-05 DIAGNOSIS — Z89412 Acquired absence of left great toe: Secondary | ICD-10-CM | POA: Diagnosis not present

## 2017-08-05 DIAGNOSIS — E119 Type 2 diabetes mellitus without complications: Secondary | ICD-10-CM

## 2017-08-05 DIAGNOSIS — E1151 Type 2 diabetes mellitus with diabetic peripheral angiopathy without gangrene: Secondary | ICD-10-CM | POA: Diagnosis not present

## 2017-08-05 DIAGNOSIS — E1121 Type 2 diabetes mellitus with diabetic nephropathy: Secondary | ICD-10-CM | POA: Diagnosis not present

## 2017-08-05 DIAGNOSIS — E1142 Type 2 diabetes mellitus with diabetic polyneuropathy: Secondary | ICD-10-CM

## 2017-08-05 DIAGNOSIS — Z794 Long term (current) use of insulin: Secondary | ICD-10-CM

## 2017-08-05 DIAGNOSIS — F99 Mental disorder, not otherwise specified: Secondary | ICD-10-CM | POA: Diagnosis not present

## 2017-08-05 DIAGNOSIS — Z89421 Acquired absence of other right toe(s): Secondary | ICD-10-CM

## 2017-08-05 DIAGNOSIS — Z89422 Acquired absence of other left toe(s): Secondary | ICD-10-CM | POA: Diagnosis not present

## 2017-08-05 DIAGNOSIS — Z9114 Patient's other noncompliance with medication regimen: Secondary | ICD-10-CM | POA: Diagnosis not present

## 2017-08-05 LAB — POCT GLYCOSYLATED HEMOGLOBIN (HGB A1C): HEMOGLOBIN A1C: 10

## 2017-08-05 NOTE — Patient Instructions (Addendum)
check your blood sugar twice a day.  vary the time of day when you check, between before the 3 meals, and at bedtime.  also check if you have symptoms of your blood sugar being too high or too low.  please keep a record of the readings and bring it to your next appointment here (or you can bring the meter itself).  You can write it on any piece of paper.  please call us sooner if your blood sugar goes below 70, or if you have a lot of readings over 200. Please continue the same insulin.  It is really important to never miss it.   On this type of insulin schedule, you should eat meals on a regular schedule.  If a meal is missed or significantly delayed, your blood sugar could go low.  Please call Beverly Sessions, to get a follow-up appointment.  Please come back for a follow-up appointment in 3 months.

## 2017-08-05 NOTE — Progress Notes (Signed)
Subjective:    Patient ID: Caleb Joseph, male    DOB: February 01, 1956, 61 y.o.   MRN: [id number]  HPI Pt returns for f/u of diabetes mellitus: DM type: Insulin-requiring type 2 Dx'ed: 2694 Complications: polyneuropathy, nephropathy, toe amputations, and PAD Therapy: insulin since 2004 DKA: never Severe hypoglycemia: never Pancreatitis: never Other: he takes qd insulin, due to h/o med noncompliance.  Interval history: He still frequently misses the insulin.  He attributes this to "psychological burnout."  He is overdue for f/u at [facility].  He is supposed to go Q 3 months.   He brings a record of his cbg's which I have reviewed today.  It varies from 144-300's. Past Medical History:  Diagnosis Date  . Abscess of prostate 02/17/2009  . Diabetes mellitus   . Diabetic foot infection ([place]) 12/26/2011  . 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 HYPERLIPIDEMIA 08/26/2007  . Hypertension   . 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 AMPUTATION  12/30/2011   Procedure: AMPUTATION DIGIT;  Surgeon: Marybelle Killings, MD;  Location: [facility];  Service: Orthopedics;  Laterality: Left;  Left Great  . AMPUTATION  03/20/2012   Procedure: AMPUTATION DIGIT;  Surgeon: Mcarthur Rossetti, MD;  Location: [facility];  Service: Orthopedics;  Laterality: Left;  Left foot 3rd toe amputation  . CARDIAC CATHETERIZATION    . I&D EXTREMITY Left 09/28/2015   Procedure: IRRIGATION AND DRAINAGE OF LEFT FOOT with second and third metatarsal amputation;  Surgeon: Mcarthur Rossetti, MD;  Location: [facility];  Service: Orthopedics;  Laterality: Left;  . TOE AMPUTATION  08/2009   R little toe    Social History   Social History  . Marital status: Divorced    Spouse name: N/A  . Number of children: N/A  . Years of education: N/A   Occupational History  . Not on file.   Social History Main Topics  . Smoking status: Current Some Day  Smoker    Types: Cigars, Pipe  . Smokeless tobacco: Current User    Types: Chew, Snuff     Comment: occasional chew and cigarette, pipe, cigar user  . Alcohol use Yes     Comment: occasional  . Drug use: No  . Sexual activity: Not on file   Other Topics Concern  . Not on file   Social History Narrative   Disability.          Current Outpatient Prescriptions on File Prior to Visit  Medication Sig Dispense Refill  . albuterol (PROVENTIL HFA;VENTOLIN HFA) 108 (90 BASE) MCG/ACT inhaler Inhale 2 puffs into the lungs every 6 (six) hours as needed. For cold symptoms 1 Inhaler 1  . cholecalciferol (VITAMIN D) 1000 UNITS tablet Take 1,000 Units by mouth daily.    . divalproex (DEPAKOTE) 500 MG DR tablet Take 1,000 mg by mouth at bedtime.    . DULoxetine (CYMBALTA) 30 MG capsule Take 1 capsule (30 mg total) by mouth at bedtime. 30 capsule 11  . ferrous sulfate 325 (65 FE) MG tablet Take 1 tablet (325 mg total) by mouth daily with breakfast. 30 tablet 0  . folic acid (FOLVITE) 854 MCG tablet Take 800 mcg by mouth daily.     Marland Kitchen gabapentin (NEURONTIN) 600 MG tablet Take 600 mg by mouth 2 (two) times daily.    . Insulin Glargine (TOUJEO SOLOSTAR) 300 UNIT/ML SOPN Inject 60 Units into the skin every morning. And pen  needles 3/day. 10 pen   . metFORMIN (GLUCOPHAGE-XR) 500 MG 24 hr tablet Take 4 tablets (2,000 mg total) by mouth daily with breakfast. (Patient taking differently: Take 2,000 mg by mouth daily with breakfast. ) 120 tablet 11  . omega-3 acid ethyl esters (LOVAZA) 1 G capsule Take 2 capsules (2 g total) by mouth 2 (two) times daily. 180 capsule 3  . omeprazole (PRILOSEC) 20 MG capsule Take 20 mg by mouth daily.    . rosuvastatin (CRESTOR) 5 MG tablet Take 1 tablet (5 mg total) by mouth daily. 30 tablet 11  . sennosides-docusate sodium (SENOKOT-S) 8.6-50 MG tablet Take 1 tablet by mouth at bedtime.     No current facility-administered medications on file prior to visit.     Allergies    Allergen Reactions  . Lisinopril Other (See Comments)    Caused upper arm and shoulder weakness  . Losartan Other (See Comments)    Caused upper arm and shoulder weakness    Family History  Problem Relation Age of Onset  . Diabetes Mother     BP 130/84 (BP Location: Left Arm, Patient Position: Sitting)   Pulse 96   Wt 241 lb (109.3 kg)   SpO2 98%   BMI 30.12 kg/m   Review of Systems He denies hypoglycemia.     Objective:   Physical Exam VITAL SIGNS:  See vs page GENERAL: no distress Pulses: foot pulses are intact bilaterally.   MSK: no deformity of the feet or ankles, but these toes have been amputated: left 1, 2, and 5; right 1 and 5. CV: 1+ bilat leg edema.  Skin:  no ulcer on the feet or ankles.  normal color and temp on the feet and ankles Neuro: sensation is absent to touch on the feet and ankles.    Lab Results  Component Value Date   HGBA1C 10.0 08/05/2017      Assessment & Plan:  Insulin-requiring type 2 DM, with PAD: ongoing poor glycemic control.    Noncompliance: in view of the fact that this is chronic, I have nothing further to offer in terms of DM rx.   Mental illness of uncertain type, therapy limited by noncompliance.  This complicates the rx of DM.   Patient Instructions  check your blood sugar twice a day.  vary the time of day when you check, between before the 3 meals, and at bedtime.  also check if you have symptoms of your blood sugar being too high or too low.  please keep a record of the readings and bring it to your next appointment here (or you can bring the meter itself).  You can write it on any piece of paper.  please call us sooner if your blood sugar goes below 70, or if you have a lot of readings over 200. Please continue the same insulin.  It is really important to never miss it.   On this type of insulin schedule, you should eat meals on a regular schedule.  If a meal is missed or significantly delayed, your blood sugar could go low.   Please call Beverly Sessions, to get a follow-up appointment.  Please come back for a follow-up appointment in 3 months.

## 2017-08-07 ENCOUNTER — Encounter: Payer: Self-pay | Admitting: Endocrinology

## 2017-08-12 ENCOUNTER — Telehealth: Payer: Self-pay | Admitting: Endocrinology

## 2017-08-12 NOTE — Telephone Encounter (Signed)
Patient dismissed from [facility] by Renato Shin , effective August 07, 2017. Dismissal letter sent out by certified / registered mail.  daj

## 2017-08-13 ENCOUNTER — Ambulatory Visit (INDEPENDENT_AMBULATORY_CARE_PROVIDER_SITE_OTHER): Payer: Medicare HMO | Admitting: Podiatry

## 2017-08-13 ENCOUNTER — Encounter: Payer: Self-pay | Admitting: Podiatry

## 2017-08-13 DIAGNOSIS — B351 Tinea unguium: Secondary | ICD-10-CM | POA: Diagnosis not present

## 2017-08-13 DIAGNOSIS — Z9889 Other specified postprocedural states: Secondary | ICD-10-CM

## 2017-08-13 DIAGNOSIS — Z89411 Acquired absence of right great toe: Secondary | ICD-10-CM

## 2017-08-13 DIAGNOSIS — M79676 Pain in unspecified toe(s): Secondary | ICD-10-CM

## 2017-08-13 DIAGNOSIS — E1149 Type 2 diabetes mellitus with other diabetic neurological complication: Secondary | ICD-10-CM

## 2017-08-13 NOTE — Progress Notes (Addendum)
Subjective:     Patient ID: Caleb Joseph, male   DOB: Oct 20, 1956, 61 y.o.   MRN: [id number]  HPIThis patient returns to the office for evaluation of his diabetic feet..   Patient has long thick painful nails on his remaining toes.   He presents for evaluation and treatment. He says he is doing well with his shoes.   Review of Systems     Objective:   Physical Exam GENERAL APPEARANCE: Alert, conversant. Appropriately groomed. No acute distress.  VASCULAR: Pedal pulses palpable at  [facility] and PT bilateral.  Capillary refill time is immediate to all digits,  Normal temperature gradient.   NEUROLOGIC: sensation is absent  to 5.07 monofilament at 5/5 sites bilateral.  Light touch is intact bilateral, Muscle strength normal.  MUSCULOSKELETAL: acceptable muscle strength, tone and stability bilateral.  Intrinsic muscluature intact bilateral.  Rectus appearance of foot and digits noted bilateral. Amputation 1,2,3 left digits.  Excision 3rd metatarsal head left foot.  Amputation 5th ray right foot and distal phalanx IPJ right foot.  DERMATOLOGIC: skin color, texture, and turgor are within normal limits.  No preulcerative lesions or ulcers  are seen, no interdigital maceration noted.  No open lesions present.  . No drainage noted. Callus sub 5th metabase right foot .  No evidence of ulcerations or infections both feet.  Callus noted retrocalcaneally right foot.  Callus sub 1 B/L. All callus are asymptomatic. NAILS  Thick disfigured discolored nails on five toes both feet.      Assessment:     Onychomycosis      Plan:     Debride Nails.  Debride callus right foot.  RTC 10 weeks.   Gardiner Barefoot DPM

## 2017-09-06 NOTE — Telephone Encounter (Signed)
Certified dismissal letter returned as undeliverable, unclaimed, return to sender after three attempts by [employer] on September 06, 2017. Letter placed in another envelope and resent as 1st class mail which does not require a signature. daj

## 2017-09-26 DEATH — deceased

## 2017-11-05 ENCOUNTER — Ambulatory Visit: Payer: Self-pay | Admitting: Endocrinology

## 2017-11-12 ENCOUNTER — Ambulatory Visit: Payer: Medicaid Other | Admitting: Podiatry

## 2018-03-03 IMAGING — DX DG CHEST 2V
2 series · 2 of 2 positions shown · non-contrast
Comparison: 10/12/2016

CLINICAL DATA: Central chest pain for 2-3 days

EXAM:
CHEST  2 VIEW

[chest lat]
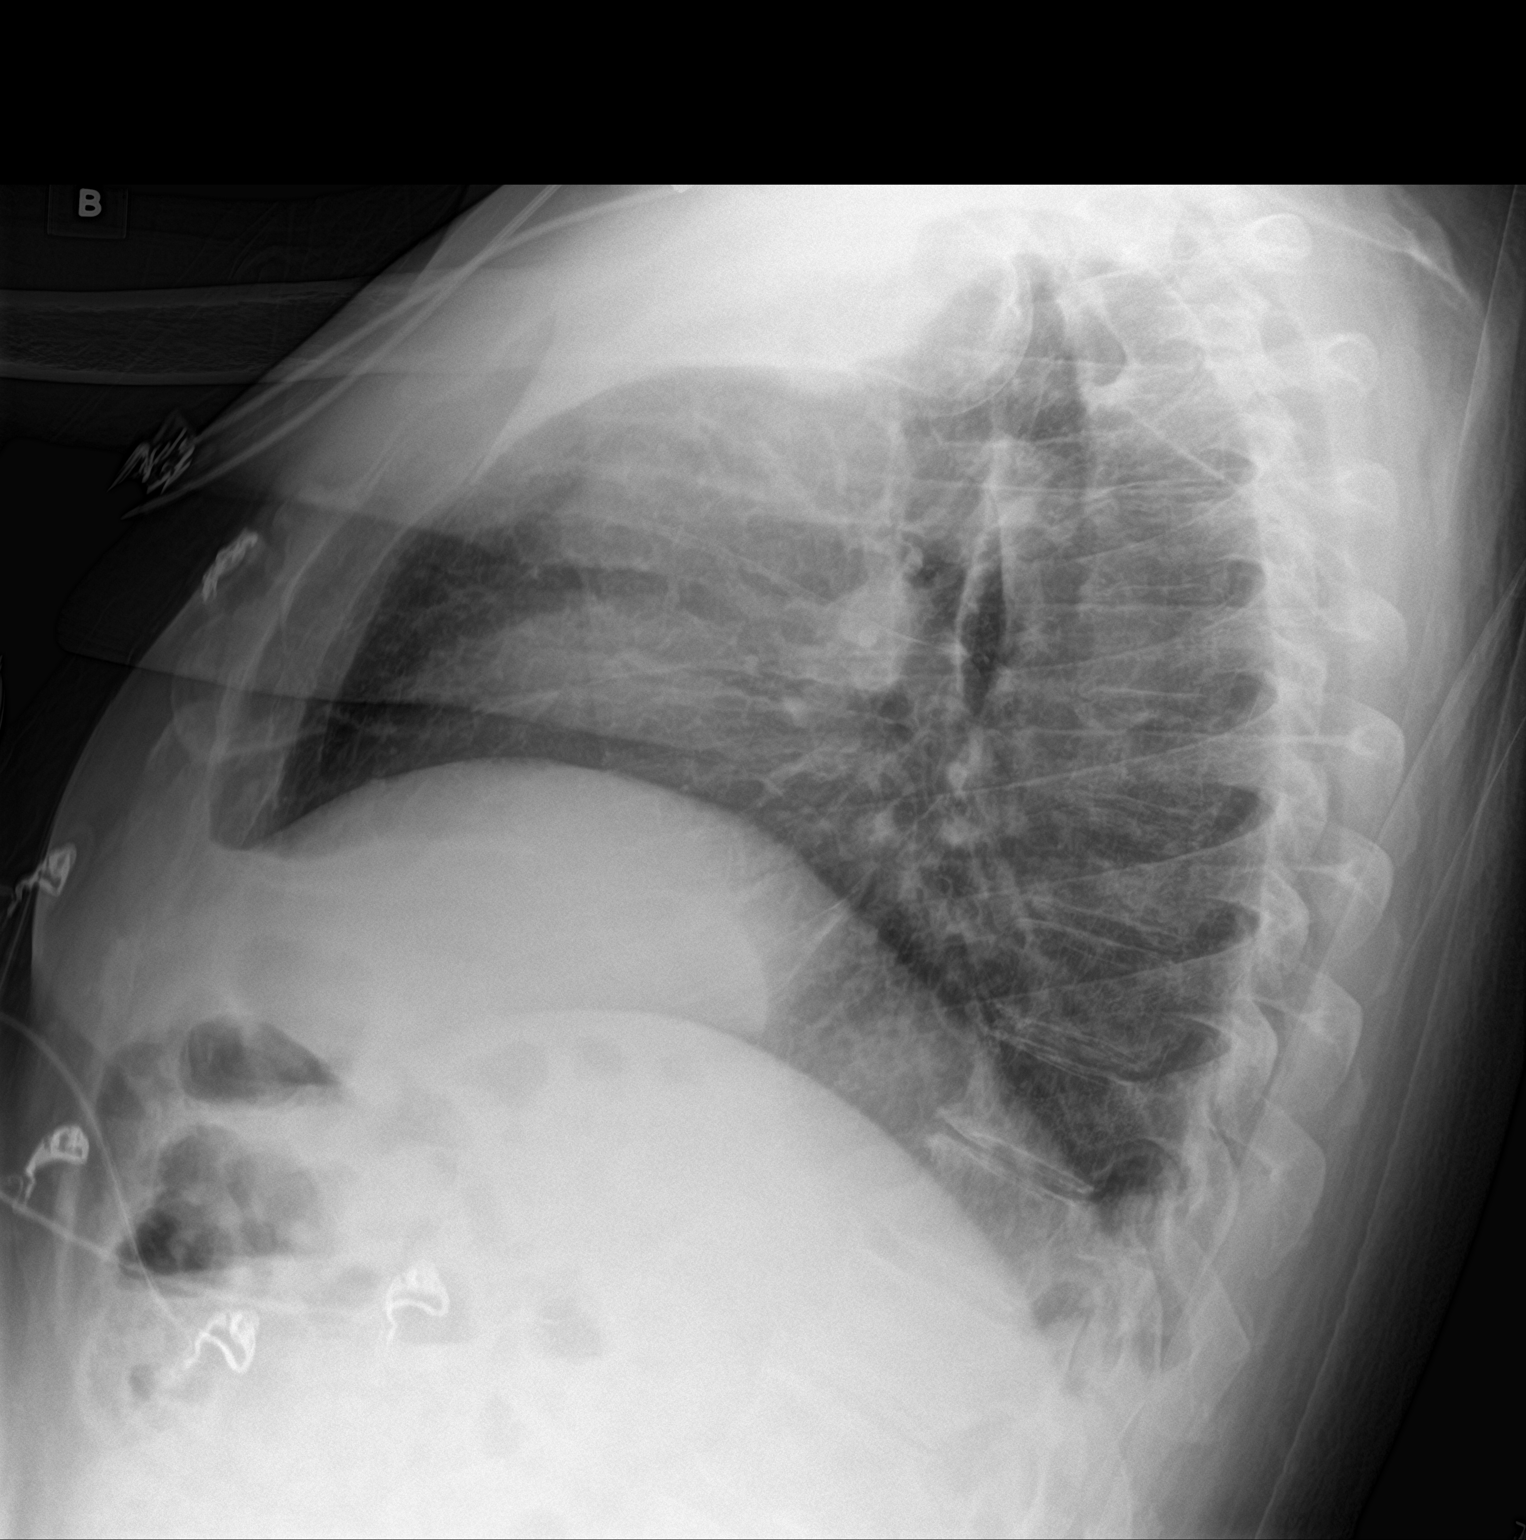

[chest ap]
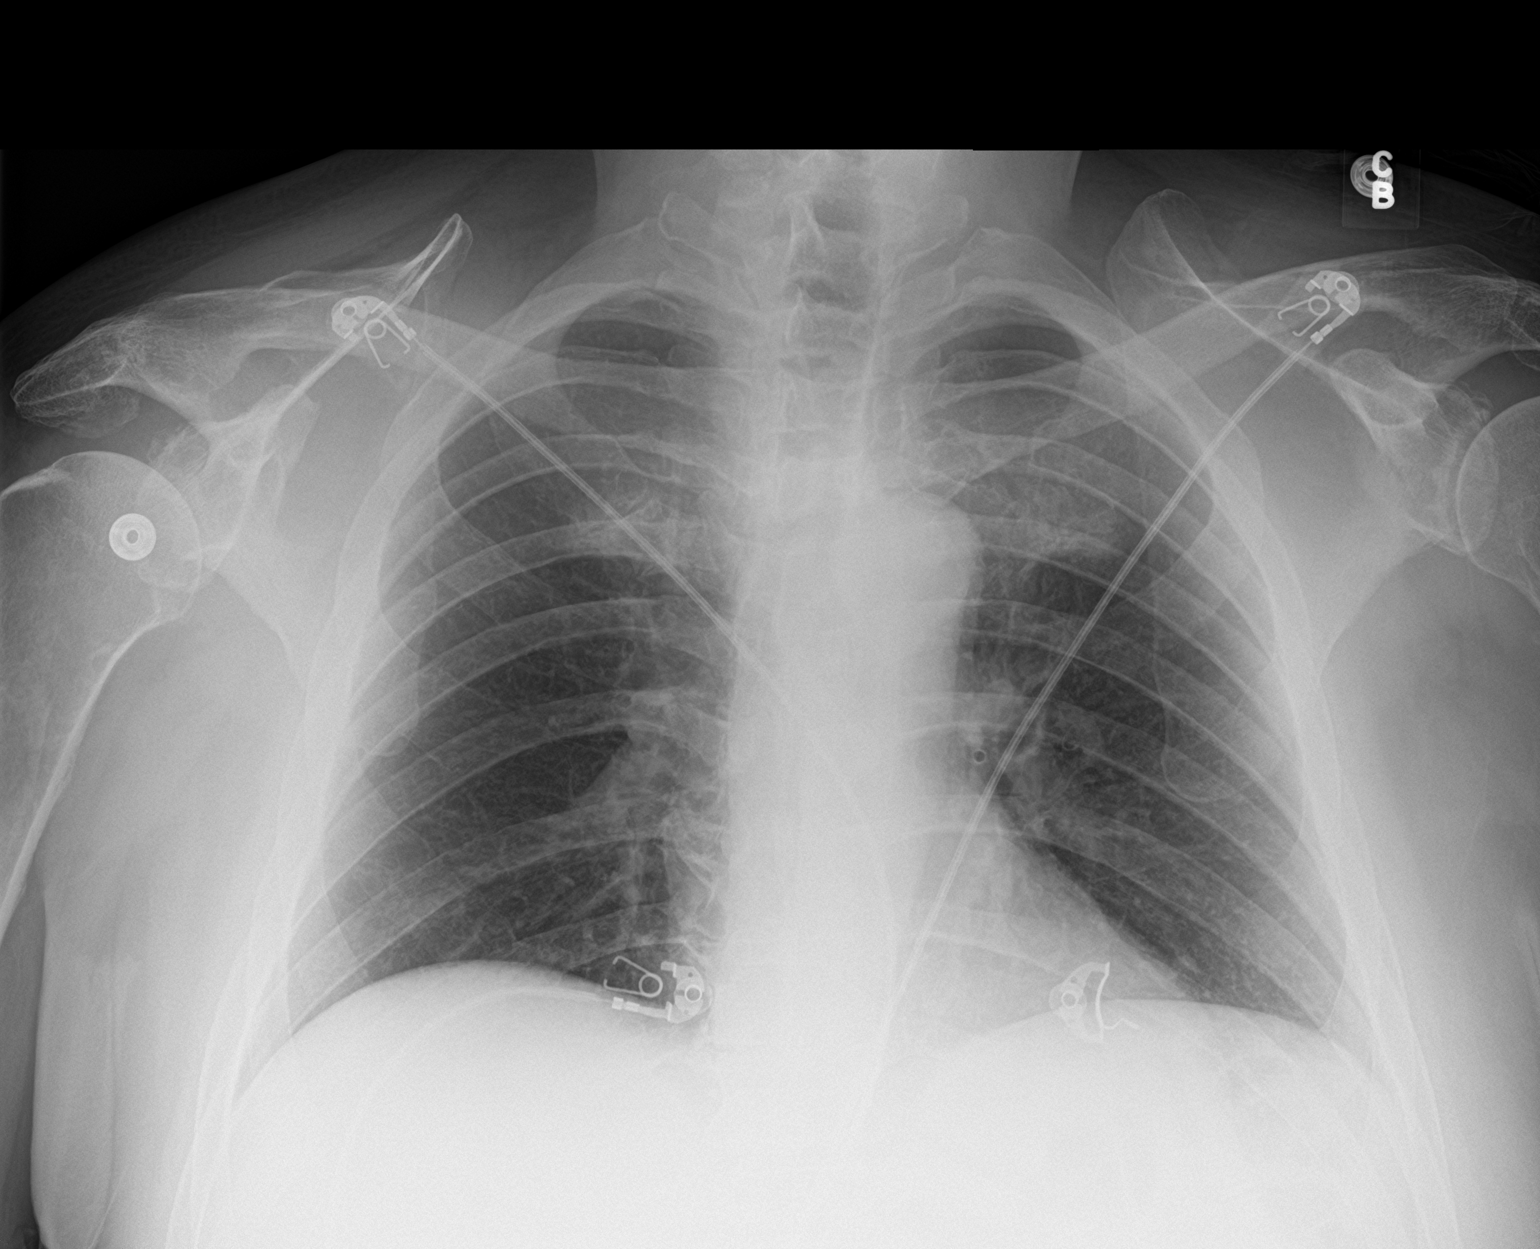

[2 of 2 positions shown; findings below may reference images not displayed]

FINDINGS: The heart size and mediastinal contours are within normal limits.
Both lungs are clear. The visualized skeletal structures are
unremarkable.
IMPRESSION: No active cardiopulmonary disease.
